# Patient Record
Sex: Female | Born: 1950 | Race: White | Hispanic: No | Marital: Married | Smoking: Never smoker
Health system: Southern US, Community
[De-identification: ages and names within clinical notes are randomized; demographics above are authoritative.]

## PROBLEM LIST (undated history)

## (undated) DIAGNOSIS — L259 Unspecified contact dermatitis, unspecified cause: Secondary | ICD-10-CM

## (undated) DIAGNOSIS — F329 Major depressive disorder, single episode, unspecified: Secondary | ICD-10-CM

## (undated) DIAGNOSIS — I10 Essential (primary) hypertension: Secondary | ICD-10-CM

## (undated) DIAGNOSIS — F411 Generalized anxiety disorder: Secondary | ICD-10-CM

## (undated) DIAGNOSIS — R32 Unspecified urinary incontinence: Secondary | ICD-10-CM

## (undated) DIAGNOSIS — R0609 Other forms of dyspnea: Secondary | ICD-10-CM

## (undated) DIAGNOSIS — E785 Hyperlipidemia, unspecified: Secondary | ICD-10-CM

## (undated) DIAGNOSIS — R062 Wheezing: Secondary | ICD-10-CM

## (undated) DIAGNOSIS — H7090 Unspecified mastoiditis, unspecified ear: Secondary | ICD-10-CM

## (undated) DIAGNOSIS — D649 Anemia, unspecified: Secondary | ICD-10-CM

## (undated) DIAGNOSIS — J984 Other disorders of lung: Secondary | ICD-10-CM

## (undated) DIAGNOSIS — E1159 Type 2 diabetes mellitus with other circulatory complications: Secondary | ICD-10-CM

## (undated) DIAGNOSIS — I5032 Chronic diastolic (congestive) heart failure: Secondary | ICD-10-CM

## (undated) DIAGNOSIS — L0390 Cellulitis, unspecified: Secondary | ICD-10-CM

## (undated) DIAGNOSIS — F3289 Other specified depressive episodes: Secondary | ICD-10-CM

## (undated) DIAGNOSIS — I509 Heart failure, unspecified: Secondary | ICD-10-CM

## (undated) DIAGNOSIS — R0989 Other specified symptoms and signs involving the circulatory and respiratory systems: Secondary | ICD-10-CM

## (undated) DIAGNOSIS — J069 Acute upper respiratory infection, unspecified: Secondary | ICD-10-CM

## (undated) DIAGNOSIS — H33039 Retinal detachment with giant retinal tear, unspecified eye: Secondary | ICD-10-CM

## (undated) DIAGNOSIS — J45901 Unspecified asthma with (acute) exacerbation: Secondary | ICD-10-CM

## (undated) HISTORY — DX: Unspecified contact dermatitis, unspecified cause: L25.9

## (undated) HISTORY — DX: Unspecified mastoiditis, unspecified ear: H70.90

## (undated) HISTORY — DX: Heart failure, unspecified: I50.9

## (undated) HISTORY — PX: ABDOMINAL HYSTERECTOMY: SHX81

## (undated) HISTORY — DX: Hyperlipidemia, unspecified: E78.5

## (undated) HISTORY — DX: Unspecified asthma with (acute) exacerbation: J45.901

## (undated) HISTORY — DX: Cellulitis, unspecified: L03.90

## (undated) HISTORY — DX: Type 2 diabetes mellitus with other circulatory complications: E11.59

## (undated) HISTORY — DX: Anemia, unspecified: D64.9

## (undated) HISTORY — DX: Retinal detachment with giant retinal tear, unspecified eye: H33.039

## (undated) HISTORY — DX: Essential (primary) hypertension: I10

## (undated) HISTORY — DX: Major depressive disorder, single episode, unspecified: F32.9

## (undated) HISTORY — DX: Other specified symptoms and signs involving the circulatory and respiratory systems: R09.89

## (undated) HISTORY — DX: Unspecified urinary incontinence: R32

## (undated) HISTORY — DX: Other specified depressive episodes: F32.89

## (undated) HISTORY — DX: Acute upper respiratory infection, unspecified: J06.9

## (undated) HISTORY — DX: Wheezing: R06.2

## (undated) HISTORY — PX: DILATION AND CURETTAGE OF UTERUS: SHX78

## (undated) HISTORY — PX: EYE SURGERY: SHX253

## (undated) HISTORY — DX: Chronic diastolic (congestive) heart failure: I50.32

## (undated) HISTORY — DX: Other specified symptoms and signs involving the circulatory and respiratory systems: R06.09

## (undated) HISTORY — DX: Generalized anxiety disorder: F41.1

## (undated) HISTORY — DX: Other disorders of lung: J98.4

---

## 1998-05-14 ENCOUNTER — Emergency Department (HOSPITAL_COMMUNITY): Admission: EM | Admit: 1998-05-14 | Discharge: 1998-05-14 | Payer: Self-pay | Admitting: Emergency Medicine

## 1998-11-15 ENCOUNTER — Encounter (INDEPENDENT_AMBULATORY_CARE_PROVIDER_SITE_OTHER): Payer: Self-pay | Admitting: Internal Medicine

## 1998-11-16 ENCOUNTER — Other Ambulatory Visit: Admission: RE | Admit: 1998-11-16 | Discharge: 1998-11-16 | Payer: Self-pay | Admitting: Family Medicine

## 1999-03-19 ENCOUNTER — Inpatient Hospital Stay (HOSPITAL_COMMUNITY): Admission: EM | Admit: 1999-03-19 | Discharge: 1999-03-27 | Payer: Self-pay | Admitting: Internal Medicine

## 1999-03-19 ENCOUNTER — Encounter: Payer: Self-pay | Admitting: Internal Medicine

## 1999-03-20 ENCOUNTER — Encounter: Payer: Self-pay | Admitting: Internal Medicine

## 1999-03-26 ENCOUNTER — Encounter: Payer: Self-pay | Admitting: Internal Medicine

## 1999-08-05 ENCOUNTER — Emergency Department (HOSPITAL_COMMUNITY): Admission: EM | Admit: 1999-08-05 | Discharge: 1999-08-05 | Payer: Self-pay | Admitting: Emergency Medicine

## 2000-01-15 ENCOUNTER — Encounter (INDEPENDENT_AMBULATORY_CARE_PROVIDER_SITE_OTHER): Payer: Self-pay | Admitting: Internal Medicine

## 2000-02-04 ENCOUNTER — Inpatient Hospital Stay (HOSPITAL_COMMUNITY): Admission: EM | Admit: 2000-02-04 | Discharge: 2000-02-08 | Payer: Self-pay | Admitting: Infectious Diseases

## 2000-04-16 ENCOUNTER — Encounter (INDEPENDENT_AMBULATORY_CARE_PROVIDER_SITE_OTHER): Payer: Self-pay | Admitting: Internal Medicine

## 2000-06-17 ENCOUNTER — Encounter: Payer: Self-pay | Admitting: General Surgery

## 2000-06-17 ENCOUNTER — Encounter: Admission: RE | Admit: 2000-06-17 | Discharge: 2000-06-17 | Payer: Self-pay | Admitting: General Surgery

## 2001-01-03 ENCOUNTER — Encounter: Payer: Self-pay | Admitting: Internal Medicine

## 2001-01-03 ENCOUNTER — Inpatient Hospital Stay (HOSPITAL_COMMUNITY): Admission: EM | Admit: 2001-01-03 | Discharge: 2001-01-04 | Payer: Self-pay | Admitting: Emergency Medicine

## 2001-04-16 ENCOUNTER — Encounter (INDEPENDENT_AMBULATORY_CARE_PROVIDER_SITE_OTHER): Payer: Self-pay | Admitting: Internal Medicine

## 2001-07-17 ENCOUNTER — Encounter (INDEPENDENT_AMBULATORY_CARE_PROVIDER_SITE_OTHER): Payer: Self-pay | Admitting: Internal Medicine

## 2001-07-17 LAB — CONVERTED CEMR LAB: Hgb A1c MFr Bld: 7.9 %

## 2001-09-16 HISTORY — PX: KNEE SURGERY: SHX244

## 2002-12-16 ENCOUNTER — Encounter (INDEPENDENT_AMBULATORY_CARE_PROVIDER_SITE_OTHER): Payer: Self-pay | Admitting: Internal Medicine

## 2002-12-16 LAB — CONVERTED CEMR LAB: Hgb A1c MFr Bld: 8.5 %

## 2002-12-30 ENCOUNTER — Other Ambulatory Visit: Admission: RE | Admit: 2002-12-30 | Discharge: 2003-01-15 | Payer: Self-pay | Admitting: Internal Medicine

## 2003-07-08 ENCOUNTER — Encounter: Admission: RE | Admit: 2003-07-08 | Discharge: 2003-07-08 | Payer: Self-pay | Admitting: Family Medicine

## 2003-07-08 ENCOUNTER — Encounter: Payer: Self-pay | Admitting: Family Medicine

## 2004-03-16 ENCOUNTER — Encounter (INDEPENDENT_AMBULATORY_CARE_PROVIDER_SITE_OTHER): Payer: Self-pay | Admitting: Internal Medicine

## 2004-03-16 LAB — CONVERTED CEMR LAB: Hgb A1c MFr Bld: 9.1 %

## 2004-05-17 ENCOUNTER — Encounter (INDEPENDENT_AMBULATORY_CARE_PROVIDER_SITE_OTHER): Payer: Self-pay | Admitting: Internal Medicine

## 2004-05-17 LAB — CONVERTED CEMR LAB: Hgb A1c MFr Bld: 8.2 %

## 2004-12-22 ENCOUNTER — Ambulatory Visit: Payer: Self-pay | Admitting: Family Medicine

## 2005-04-16 ENCOUNTER — Encounter (INDEPENDENT_AMBULATORY_CARE_PROVIDER_SITE_OTHER): Payer: Self-pay | Admitting: Internal Medicine

## 2005-04-16 LAB — CONVERTED CEMR LAB
Hgb A1c MFr Bld: 8 %
Pap Smear: NORMAL

## 2005-04-16 LAB — FECAL OCCULT BLOOD, GUAIAC: Fecal Occult Blood: NEGATIVE

## 2005-04-19 ENCOUNTER — Ambulatory Visit: Payer: Self-pay | Admitting: Family Medicine

## 2005-04-19 ENCOUNTER — Other Ambulatory Visit: Admission: RE | Admit: 2005-04-19 | Discharge: 2005-04-19 | Payer: Self-pay | Admitting: Family Medicine

## 2005-04-22 ENCOUNTER — Ambulatory Visit: Payer: Self-pay | Admitting: Family Medicine

## 2005-04-24 ENCOUNTER — Ambulatory Visit: Payer: Self-pay | Admitting: Family Medicine

## 2005-04-29 ENCOUNTER — Ambulatory Visit: Payer: Self-pay | Admitting: Family Medicine

## 2005-05-17 ENCOUNTER — Encounter (INDEPENDENT_AMBULATORY_CARE_PROVIDER_SITE_OTHER): Payer: Self-pay | Admitting: Internal Medicine

## 2005-05-17 LAB — CONVERTED CEMR LAB: Hgb A1c MFr Bld: 7.9 %

## 2005-05-31 ENCOUNTER — Ambulatory Visit: Payer: Self-pay | Admitting: Family Medicine

## 2005-06-04 ENCOUNTER — Ambulatory Visit: Payer: Self-pay | Admitting: Family Medicine

## 2005-06-07 ENCOUNTER — Ambulatory Visit: Payer: Self-pay | Admitting: Family Medicine

## 2005-06-12 ENCOUNTER — Ambulatory Visit: Payer: Self-pay

## 2005-06-13 ENCOUNTER — Ambulatory Visit: Payer: Self-pay

## 2005-07-31 ENCOUNTER — Encounter: Admission: RE | Admit: 2005-07-31 | Discharge: 2005-07-31 | Payer: Self-pay | Admitting: Family Medicine

## 2006-01-14 ENCOUNTER — Encounter (INDEPENDENT_AMBULATORY_CARE_PROVIDER_SITE_OTHER): Payer: Self-pay | Admitting: Internal Medicine

## 2006-01-28 ENCOUNTER — Ambulatory Visit: Payer: Self-pay | Admitting: Family Medicine

## 2006-05-17 ENCOUNTER — Encounter (INDEPENDENT_AMBULATORY_CARE_PROVIDER_SITE_OTHER): Payer: Self-pay | Admitting: Internal Medicine

## 2006-06-02 ENCOUNTER — Ambulatory Visit: Payer: Self-pay | Admitting: Family Medicine

## 2006-06-04 ENCOUNTER — Ambulatory Visit: Payer: Self-pay | Admitting: Family Medicine

## 2006-08-04 ENCOUNTER — Ambulatory Visit: Payer: Self-pay | Admitting: Family Medicine

## 2006-08-16 ENCOUNTER — Encounter (INDEPENDENT_AMBULATORY_CARE_PROVIDER_SITE_OTHER): Payer: Self-pay | Admitting: Internal Medicine

## 2006-08-16 LAB — CONVERTED CEMR LAB: Hgb A1c MFr Bld: 8.2 %

## 2006-09-11 ENCOUNTER — Ambulatory Visit: Payer: Self-pay | Admitting: Internal Medicine

## 2006-09-30 ENCOUNTER — Ambulatory Visit: Payer: Self-pay | Admitting: Family Medicine

## 2006-10-13 ENCOUNTER — Ambulatory Visit: Payer: Self-pay

## 2006-10-14 ENCOUNTER — Ambulatory Visit: Payer: Self-pay

## 2006-11-10 ENCOUNTER — Ambulatory Visit: Payer: Self-pay | Admitting: Family Medicine

## 2006-11-15 ENCOUNTER — Ambulatory Visit: Payer: Self-pay | Admitting: Internal Medicine

## 2006-11-15 ENCOUNTER — Encounter (INDEPENDENT_AMBULATORY_CARE_PROVIDER_SITE_OTHER): Payer: Self-pay | Admitting: Internal Medicine

## 2006-11-17 ENCOUNTER — Ambulatory Visit: Payer: Self-pay | Admitting: Cardiology

## 2006-11-17 ENCOUNTER — Ambulatory Visit: Payer: Self-pay | Admitting: Internal Medicine

## 2006-11-17 ENCOUNTER — Inpatient Hospital Stay (HOSPITAL_COMMUNITY): Admission: AD | Admit: 2006-11-17 | Discharge: 2006-11-20 | Payer: Self-pay | Admitting: Internal Medicine

## 2006-11-17 ENCOUNTER — Ambulatory Visit: Payer: Self-pay | Admitting: Family Medicine

## 2006-11-19 ENCOUNTER — Encounter: Payer: Self-pay | Admitting: Cardiology

## 2006-11-25 ENCOUNTER — Ambulatory Visit: Payer: Self-pay | Admitting: Family Medicine

## 2006-12-12 ENCOUNTER — Ambulatory Visit: Payer: Self-pay | Admitting: Family Medicine

## 2007-01-20 ENCOUNTER — Ambulatory Visit: Payer: Self-pay | Admitting: Family Medicine

## 2007-01-20 DIAGNOSIS — I509 Heart failure, unspecified: Secondary | ICD-10-CM

## 2007-01-20 DIAGNOSIS — J984 Other disorders of lung: Secondary | ICD-10-CM

## 2007-02-27 ENCOUNTER — Ambulatory Visit: Payer: Self-pay | Admitting: Family Medicine

## 2007-05-01 ENCOUNTER — Telehealth (INDEPENDENT_AMBULATORY_CARE_PROVIDER_SITE_OTHER): Payer: Self-pay | Admitting: *Deleted

## 2007-05-05 ENCOUNTER — Encounter: Payer: Self-pay | Admitting: Internal Medicine

## 2007-05-05 DIAGNOSIS — E785 Hyperlipidemia, unspecified: Secondary | ICD-10-CM

## 2007-05-05 DIAGNOSIS — F411 Generalized anxiety disorder: Secondary | ICD-10-CM

## 2007-05-05 DIAGNOSIS — E119 Type 2 diabetes mellitus without complications: Secondary | ICD-10-CM | POA: Insufficient documentation

## 2007-05-05 DIAGNOSIS — L259 Unspecified contact dermatitis, unspecified cause: Secondary | ICD-10-CM

## 2007-05-05 DIAGNOSIS — F329 Major depressive disorder, single episode, unspecified: Secondary | ICD-10-CM

## 2007-05-26 ENCOUNTER — Ambulatory Visit: Payer: Self-pay | Admitting: Family Medicine

## 2007-06-05 ENCOUNTER — Ambulatory Visit: Payer: Self-pay | Admitting: Cardiology

## 2007-06-09 LAB — CONVERTED CEMR LAB
CO2: 35 meq/L — ABNORMAL HIGH (ref 19–32)
Chloride: 108 meq/L (ref 96–112)
Creatinine, Ser: 0.6 mg/dL (ref 0.4–1.2)
Creatinine,U: 114.6 mg/dL
Glucose, Bld: 57 mg/dL — ABNORMAL LOW (ref 70–99)
Hgb A1c MFr Bld: 7.2 % — ABNORMAL HIGH (ref 4.6–6.0)
Sodium: 148 meq/L — ABNORMAL HIGH (ref 135–145)

## 2007-06-10 ENCOUNTER — Ambulatory Visit: Payer: Self-pay | Admitting: Family Medicine

## 2007-11-02 ENCOUNTER — Ambulatory Visit: Payer: Self-pay | Admitting: Family Medicine

## 2007-11-02 DIAGNOSIS — J069 Acute upper respiratory infection, unspecified: Secondary | ICD-10-CM | POA: Insufficient documentation

## 2007-11-02 DIAGNOSIS — R062 Wheezing: Secondary | ICD-10-CM

## 2007-11-03 ENCOUNTER — Ambulatory Visit: Payer: Self-pay | Admitting: Family Medicine

## 2007-11-03 DIAGNOSIS — I10 Essential (primary) hypertension: Secondary | ICD-10-CM | POA: Insufficient documentation

## 2007-11-04 LAB — CONVERTED CEMR LAB
AST: 21 units/L (ref 0–37)
CO2: 31 meq/L (ref 19–32)
Calcium: 9.1 mg/dL (ref 8.4–10.5)
Cholesterol: 141 mg/dL (ref 0–200)
GFR calc Af Amer: 111 mL/min
Glucose, Bld: 154 mg/dL — ABNORMAL HIGH (ref 70–99)
HDL: 38.6 mg/dL — ABNORMAL LOW (ref >39.0)
Hgb A1c MFr Bld: 7.3 % — ABNORMAL HIGH (ref 4.6–6.0)
Microalb Creat Ratio: 8.2 mg/g (ref 0.0–30.0)
TSH: 2.37 microintl units/mL (ref 0.35–5.50)
Total CHOL/HDL Ratio: 3.7

## 2007-11-09 ENCOUNTER — Ambulatory Visit: Payer: Self-pay | Admitting: Family Medicine

## 2007-11-24 ENCOUNTER — Telehealth (INDEPENDENT_AMBULATORY_CARE_PROVIDER_SITE_OTHER): Payer: Self-pay | Admitting: Internal Medicine

## 2007-11-26 ENCOUNTER — Telehealth (INDEPENDENT_AMBULATORY_CARE_PROVIDER_SITE_OTHER): Payer: Self-pay | Admitting: Internal Medicine

## 2007-11-26 ENCOUNTER — Inpatient Hospital Stay (HOSPITAL_COMMUNITY): Admission: EM | Admit: 2007-11-26 | Discharge: 2007-12-01 | Payer: Self-pay | Admitting: Emergency Medicine

## 2007-11-27 ENCOUNTER — Ambulatory Visit: Payer: Self-pay | Admitting: Internal Medicine

## 2007-12-08 ENCOUNTER — Ambulatory Visit: Payer: Self-pay | Admitting: Family Medicine

## 2007-12-08 DIAGNOSIS — J45901 Unspecified asthma with (acute) exacerbation: Secondary | ICD-10-CM | POA: Insufficient documentation

## 2007-12-08 DIAGNOSIS — D649 Anemia, unspecified: Secondary | ICD-10-CM

## 2008-01-14 ENCOUNTER — Emergency Department (HOSPITAL_COMMUNITY): Admission: EM | Admit: 2008-01-14 | Discharge: 2008-01-14 | Payer: Self-pay | Admitting: Emergency Medicine

## 2008-04-04 ENCOUNTER — Encounter (INDEPENDENT_AMBULATORY_CARE_PROVIDER_SITE_OTHER): Payer: Self-pay | Admitting: Internal Medicine

## 2008-05-02 IMAGING — CR DG CHEST 2V
2 series · 2 of 2 positions shown · non-contrast
Comparison: 11/26/2007 study

CLINICAL DATA: Chest pain.

CHEST - 2 VIEW

[w chest pa]
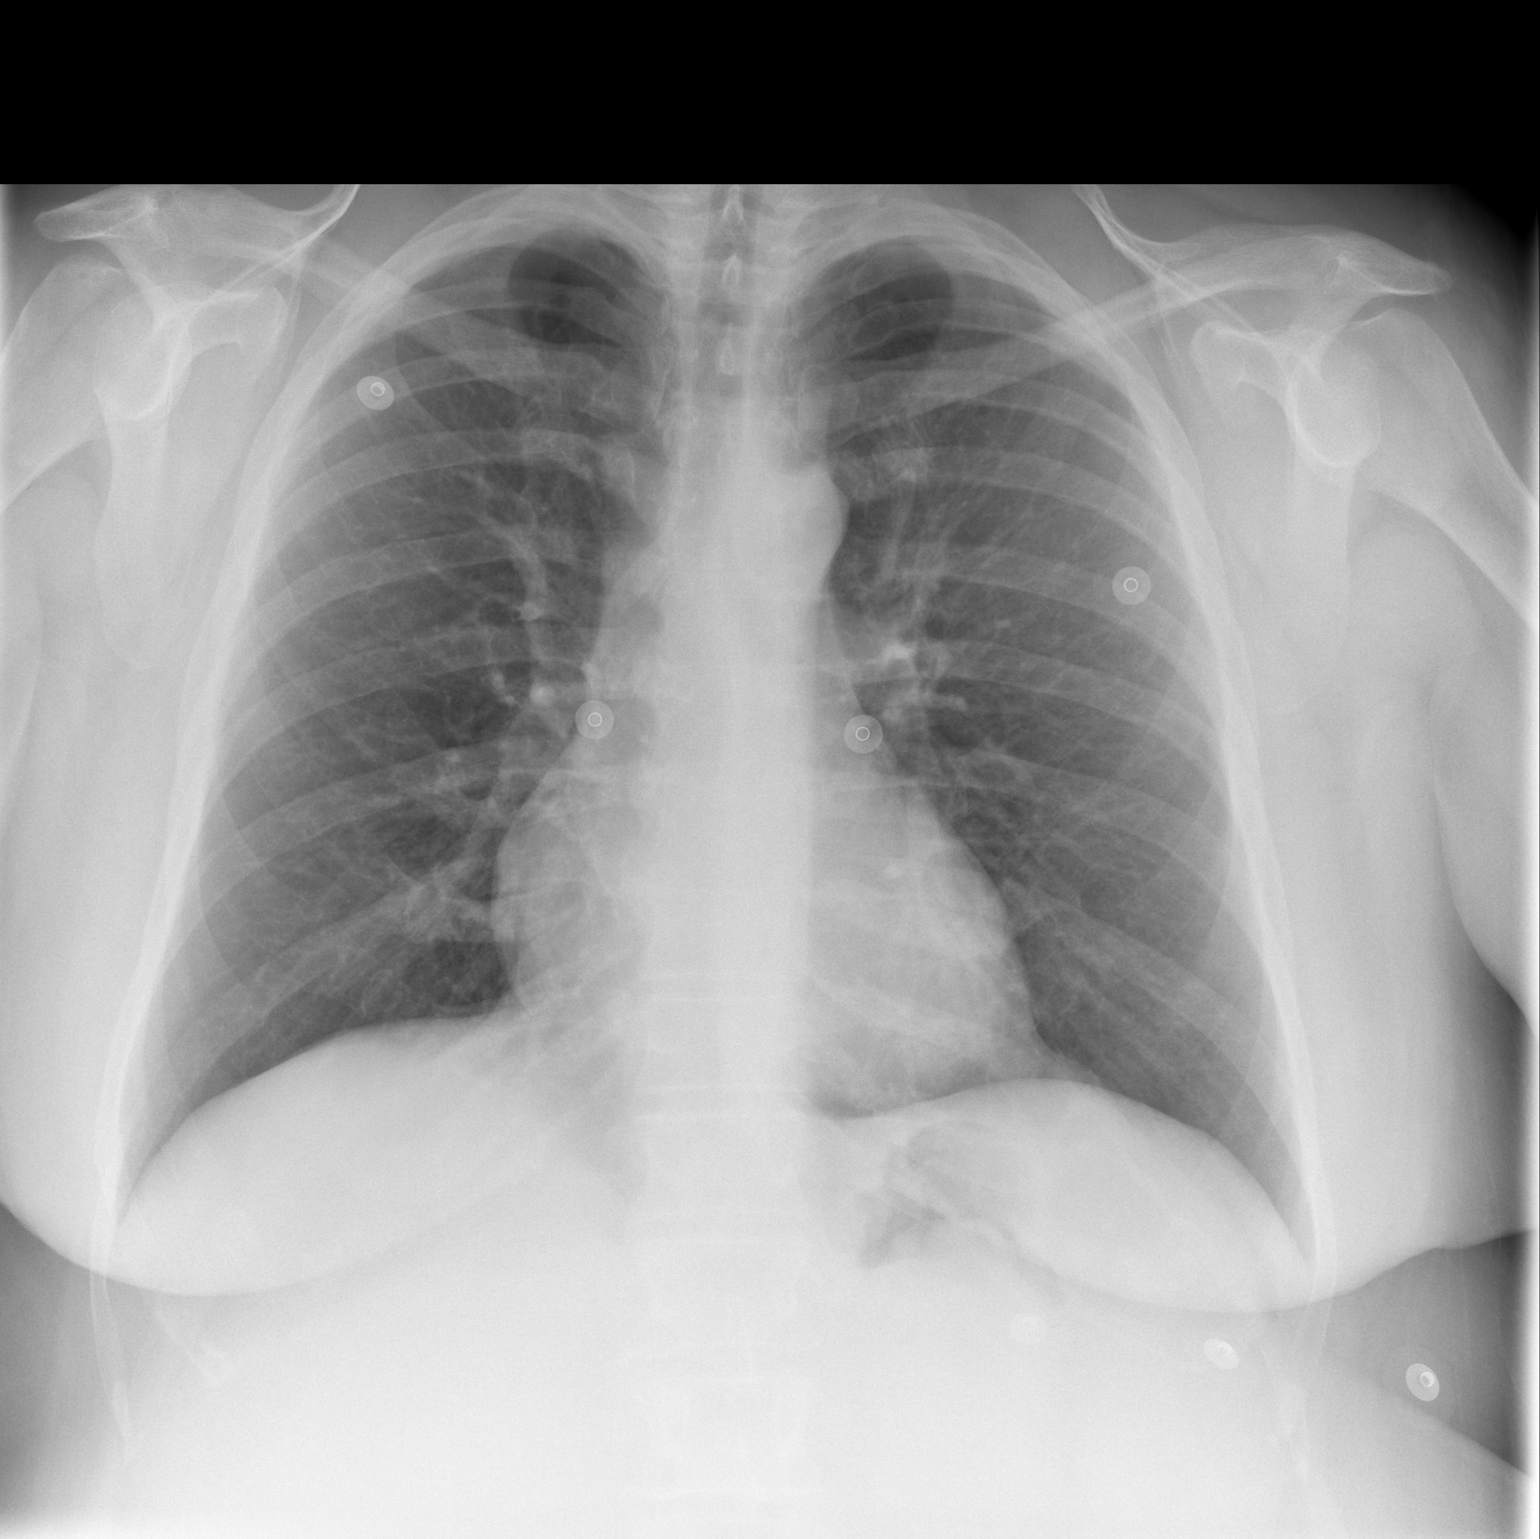

[w chest lat]
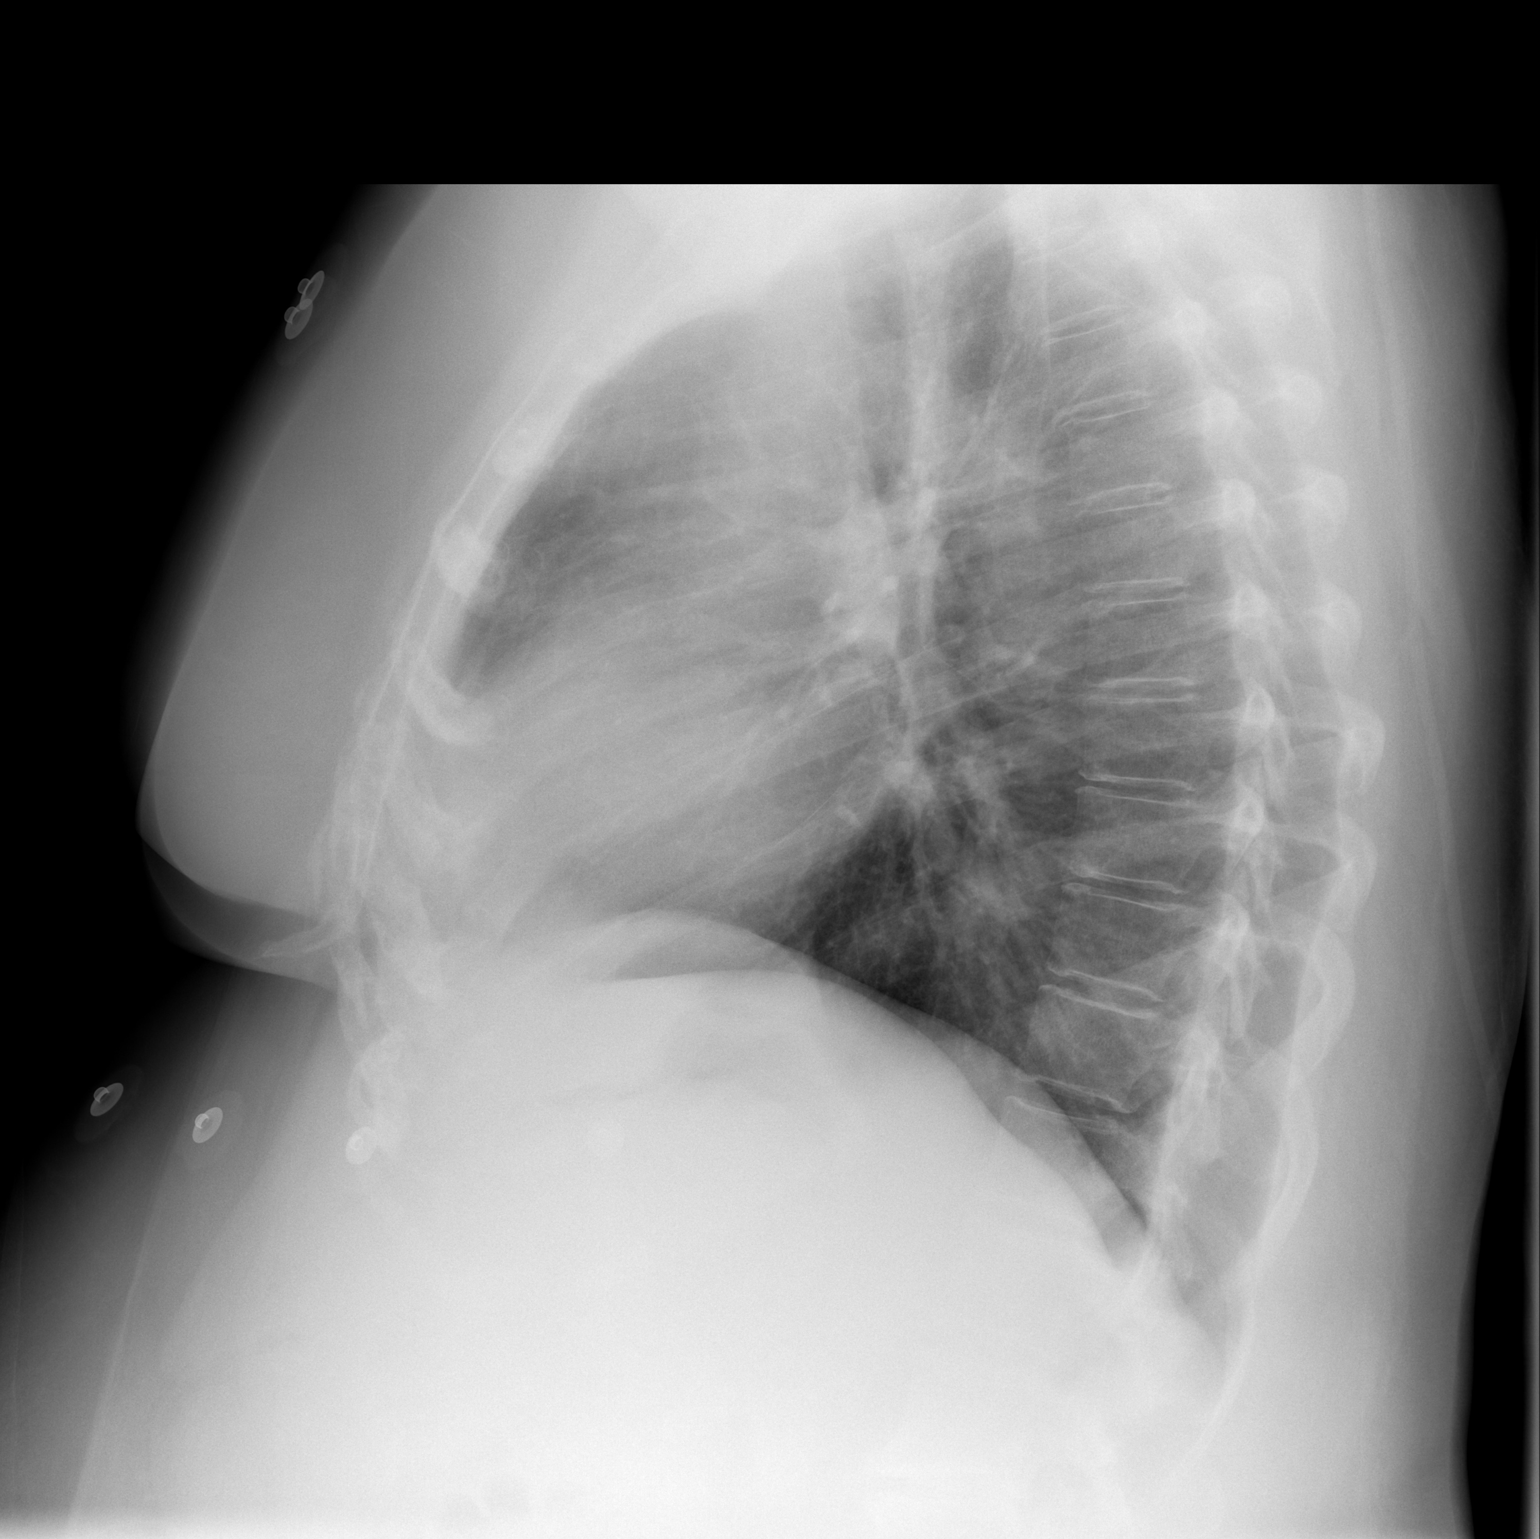

[2 of 2 positions shown; findings below may reference images not displayed]

FINDINGS: The cardiac silhouette is normal size and shape.  Lungs
free of infiltrates.  No mediastinal or pleural lesions are seen.
There is minimal degenerate spondylosis average for age.
IMPRESSION: No acute or active process seen.

## 2008-05-06 ENCOUNTER — Telehealth (INDEPENDENT_AMBULATORY_CARE_PROVIDER_SITE_OTHER): Payer: Self-pay | Admitting: Internal Medicine

## 2008-05-16 ENCOUNTER — Telehealth: Payer: Self-pay | Admitting: Family Medicine

## 2008-07-19 ENCOUNTER — Ambulatory Visit: Payer: Self-pay | Admitting: Cardiology

## 2008-07-19 ENCOUNTER — Telehealth (INDEPENDENT_AMBULATORY_CARE_PROVIDER_SITE_OTHER): Payer: Self-pay | Admitting: Internal Medicine

## 2008-07-22 ENCOUNTER — Telehealth (INDEPENDENT_AMBULATORY_CARE_PROVIDER_SITE_OTHER): Payer: Self-pay | Admitting: *Deleted

## 2008-07-24 DIAGNOSIS — H33039 Retinal detachment with giant retinal tear, unspecified eye: Secondary | ICD-10-CM

## 2008-07-24 LAB — HM DIABETES EYE EXAM

## 2008-07-26 ENCOUNTER — Ambulatory Visit (HOSPITAL_COMMUNITY): Admission: RE | Admit: 2008-07-26 | Discharge: 2008-07-27 | Payer: Self-pay | Admitting: Ophthalmology

## 2008-08-12 ENCOUNTER — Encounter (INDEPENDENT_AMBULATORY_CARE_PROVIDER_SITE_OTHER): Payer: Self-pay | Admitting: Internal Medicine

## 2008-12-09 ENCOUNTER — Telehealth (INDEPENDENT_AMBULATORY_CARE_PROVIDER_SITE_OTHER): Payer: Self-pay | Admitting: Internal Medicine

## 2008-12-13 ENCOUNTER — Ambulatory Visit: Payer: Self-pay | Admitting: Family Medicine

## 2008-12-20 LAB — CONVERTED CEMR LAB
ALT: 22 units/L (ref 0–35)
AST: 22 units/L (ref 0–37)
Basophils Absolute: 0 10*3/uL (ref 0.0–0.1)
Chloride: 108 meq/L (ref 96–112)
Creatinine,U: 124.2 mg/dL
Eosinophils Absolute: 0.1 10*3/uL (ref 0.0–0.7)
Eosinophils Relative: 2.5 % (ref 0.0–5.0)
HDL: 29.6 mg/dL — ABNORMAL LOW (ref >39.00)
Hgb A1c MFr Bld: 6.9 % — ABNORMAL HIGH (ref 4.6–6.5)
Lymphs Abs: 1.4 10*3/uL (ref 0.7–4.0)
Microalb Creat Ratio: 5.6 mg/g (ref 0.0–30.0)
Microalb, Ur: 0.7 mg/dL (ref 0.0–1.9)
Monocytes Absolute: 0.4 10*3/uL (ref 0.1–1.0)
Monocytes Relative: 6.9 % (ref 3.0–12.0)
Neutro Abs: 3.4 10*3/uL (ref 1.4–7.7)
Neutrophils Relative %: 64.7 % (ref 43.0–77.0)
Platelets: 232 10*3/uL (ref 150.0–400.0)
Potassium: 4.3 meq/L (ref 3.5–5.1)
WBC: 5.3 10*3/uL (ref 4.5–10.5)

## 2008-12-21 ENCOUNTER — Ambulatory Visit: Payer: Self-pay | Admitting: Family Medicine

## 2008-12-21 LAB — CONVERTED CEMR LAB: LDL Goal: 100 mg/dL

## 2009-01-24 ENCOUNTER — Telehealth: Payer: Self-pay | Admitting: Family Medicine

## 2009-02-27 ENCOUNTER — Ambulatory Visit: Payer: Self-pay | Admitting: Family Medicine

## 2009-02-28 ENCOUNTER — Telehealth (INDEPENDENT_AMBULATORY_CARE_PROVIDER_SITE_OTHER): Payer: Self-pay | Admitting: Internal Medicine

## 2009-02-28 DIAGNOSIS — R0609 Other forms of dyspnea: Secondary | ICD-10-CM

## 2009-02-28 DIAGNOSIS — I5032 Chronic diastolic (congestive) heart failure: Secondary | ICD-10-CM

## 2009-02-28 DIAGNOSIS — R0989 Other specified symptoms and signs involving the circulatory and respiratory systems: Secondary | ICD-10-CM

## 2009-03-01 ENCOUNTER — Ambulatory Visit: Payer: Self-pay | Admitting: Cardiology

## 2009-03-02 ENCOUNTER — Ambulatory Visit: Payer: Self-pay | Admitting: Family Medicine

## 2009-04-10 ENCOUNTER — Encounter (INDEPENDENT_AMBULATORY_CARE_PROVIDER_SITE_OTHER): Payer: Self-pay | Admitting: Internal Medicine

## 2009-05-02 ENCOUNTER — Ambulatory Visit: Payer: Self-pay | Admitting: Family Medicine

## 2009-05-04 LAB — CONVERTED CEMR LAB: Hgb A1c MFr Bld: 8.2 % — ABNORMAL HIGH (ref 4.6–6.5)

## 2009-05-10 ENCOUNTER — Ambulatory Visit: Payer: Self-pay | Admitting: Family Medicine

## 2009-05-10 DIAGNOSIS — H3320 Serous retinal detachment, unspecified eye: Secondary | ICD-10-CM | POA: Insufficient documentation

## 2009-08-09 ENCOUNTER — Encounter (INDEPENDENT_AMBULATORY_CARE_PROVIDER_SITE_OTHER): Payer: Self-pay | Admitting: Internal Medicine

## 2009-08-09 ENCOUNTER — Ambulatory Visit: Payer: Self-pay | Admitting: Family Medicine

## 2009-08-09 DIAGNOSIS — Z780 Asymptomatic menopausal state: Secondary | ICD-10-CM | POA: Insufficient documentation

## 2009-08-09 LAB — CONVERTED CEMR LAB
ALT: 21 units/L (ref 0–35)
CO2: 30 meq/L (ref 19–32)
Chloride: 101 meq/L (ref 96–112)
Cholesterol: 170 mg/dL (ref 0–200)
Creatinine, Ser: 0.6 mg/dL (ref 0.4–1.2)
Glucose, Bld: 133 mg/dL — ABNORMAL HIGH (ref 70–99)
Hgb A1c MFr Bld: 7.4 % — ABNORMAL HIGH (ref 4.6–6.5)
Triglycerides: 135 mg/dL (ref 0.0–149.0)
VLDL: 27 mg/dL (ref 0.0–40.0)

## 2009-08-16 LAB — CONVERTED CEMR LAB: Vit D, 25-Hydroxy: 20 ng/mL — ABNORMAL LOW (ref 30–89)

## 2009-08-17 ENCOUNTER — Encounter (INDEPENDENT_AMBULATORY_CARE_PROVIDER_SITE_OTHER): Payer: Self-pay | Admitting: Internal Medicine

## 2009-10-23 ENCOUNTER — Telehealth: Payer: Self-pay | Admitting: Family Medicine

## 2009-12-28 ENCOUNTER — Telehealth: Payer: Self-pay | Admitting: Family Medicine

## 2009-12-29 ENCOUNTER — Ambulatory Visit: Payer: Self-pay | Admitting: Family Medicine

## 2009-12-29 DIAGNOSIS — E669 Obesity, unspecified: Secondary | ICD-10-CM | POA: Insufficient documentation

## 2010-01-04 ENCOUNTER — Encounter: Payer: Self-pay | Admitting: Family Medicine

## 2010-01-04 LAB — CONVERTED CEMR LAB
Albumin: 3.6 g/dL (ref 3.5–5.2)
CO2: 29 meq/L (ref 19–32)
Calcium: 8.7 mg/dL (ref 8.4–10.5)
Creatinine, Ser: 0.7 mg/dL (ref 0.4–1.2)
Glucose, Bld: 196 mg/dL — ABNORMAL HIGH (ref 70–99)
HDL: 44 mg/dL (ref >39.00)
Hgb A1c MFr Bld: 7.4 % — ABNORMAL HIGH (ref 4.6–6.5)
Total Protein: 6.6 g/dL (ref 6.0–8.3)
Triglycerides: 156 mg/dL — ABNORMAL HIGH (ref 0.0–149.0)
VLDL: 31.2 mg/dL (ref 0.0–40.0)

## 2010-01-09 ENCOUNTER — Encounter: Admission: RE | Admit: 2010-01-09 | Discharge: 2010-01-09 | Payer: Self-pay | Admitting: Family Medicine

## 2010-01-17 ENCOUNTER — Encounter (INDEPENDENT_AMBULATORY_CARE_PROVIDER_SITE_OTHER): Payer: Self-pay | Admitting: *Deleted

## 2010-01-17 ENCOUNTER — Ambulatory Visit: Payer: Self-pay | Admitting: Family Medicine

## 2010-01-17 ENCOUNTER — Other Ambulatory Visit: Admission: RE | Admit: 2010-01-17 | Discharge: 2010-01-17 | Payer: Self-pay | Admitting: Family Medicine

## 2010-01-24 ENCOUNTER — Encounter (INDEPENDENT_AMBULATORY_CARE_PROVIDER_SITE_OTHER): Payer: Self-pay | Admitting: *Deleted

## 2010-01-24 LAB — CONVERTED CEMR LAB: Pap Smear: NEGATIVE

## 2010-02-05 ENCOUNTER — Encounter (INDEPENDENT_AMBULATORY_CARE_PROVIDER_SITE_OTHER): Payer: Self-pay | Admitting: *Deleted

## 2010-02-07 ENCOUNTER — Telehealth: Payer: Self-pay | Admitting: Family Medicine

## 2010-02-08 ENCOUNTER — Ambulatory Visit: Payer: Self-pay | Admitting: Internal Medicine

## 2010-02-08 ENCOUNTER — Encounter (INDEPENDENT_AMBULATORY_CARE_PROVIDER_SITE_OTHER): Payer: Self-pay | Admitting: *Deleted

## 2010-02-22 ENCOUNTER — Ambulatory Visit: Payer: Self-pay | Admitting: Internal Medicine

## 2010-02-22 LAB — HM COLONOSCOPY

## 2010-02-23 ENCOUNTER — Encounter: Payer: Self-pay | Admitting: Internal Medicine

## 2010-04-16 ENCOUNTER — Ambulatory Visit: Payer: Self-pay | Admitting: Family Medicine

## 2010-04-16 LAB — CONVERTED CEMR LAB: Hgb A1c MFr Bld: 7.5 % — ABNORMAL HIGH (ref 4.6–6.5)

## 2010-04-25 ENCOUNTER — Telehealth: Payer: Self-pay | Admitting: Family Medicine

## 2010-04-25 ENCOUNTER — Ambulatory Visit: Payer: Self-pay | Admitting: Family Medicine

## 2010-04-30 ENCOUNTER — Encounter: Payer: Self-pay | Admitting: Family Medicine

## 2010-05-17 ENCOUNTER — Encounter: Payer: Self-pay | Admitting: Family Medicine

## 2010-05-23 ENCOUNTER — Ambulatory Visit (HOSPITAL_COMMUNITY): Admission: RE | Admit: 2010-05-23 | Discharge: 2010-05-23 | Payer: Self-pay | Admitting: General Surgery

## 2010-06-08 ENCOUNTER — Encounter: Admission: RE | Admit: 2010-06-08 | Discharge: 2010-06-15 | Payer: Self-pay | Admitting: General Surgery

## 2010-07-19 ENCOUNTER — Ambulatory Visit: Payer: Self-pay | Admitting: Family Medicine

## 2010-08-02 ENCOUNTER — Ambulatory Visit: Payer: Self-pay | Admitting: Family Medicine

## 2010-08-06 ENCOUNTER — Encounter
Admission: RE | Admit: 2010-08-06 | Discharge: 2010-10-16 | Payer: Self-pay | Source: Home / Self Care | Attending: General Surgery | Admitting: General Surgery

## 2010-08-21 ENCOUNTER — Ambulatory Visit (HOSPITAL_COMMUNITY)
Admission: RE | Admit: 2010-08-21 | Discharge: 2010-08-22 | Payer: Self-pay | Source: Home / Self Care | Attending: General Surgery | Admitting: General Surgery

## 2010-08-21 HISTORY — PX: LAPAROSCOPIC GASTRIC BANDING: SHX1100

## 2010-08-31 ENCOUNTER — Encounter: Payer: Self-pay | Admitting: Family Medicine

## 2010-09-04 ENCOUNTER — Encounter
Admission: RE | Admit: 2010-09-04 | Discharge: 2010-10-16 | Payer: Self-pay | Source: Home / Self Care | Attending: General Surgery | Admitting: General Surgery

## 2010-09-28 ENCOUNTER — Ambulatory Visit
Admission: RE | Admit: 2010-09-28 | Discharge: 2010-09-28 | Payer: Self-pay | Source: Home / Self Care | Attending: Family Medicine | Admitting: Family Medicine

## 2010-10-07 ENCOUNTER — Encounter: Payer: Self-pay | Admitting: General Surgery

## 2010-10-16 NOTE — Letter (Signed)
Summary: Generic Letter  [place] at [facility]  [address] [place], [place] [address]   Phone: [phone]  Fax: [phone]    01/04/2010  LORALEI RADCLIFFE [address] [place], [place]  [address]  Dear [name],   We have been unable to contact you by telephone.  We need to speak with you about your lab results and we need an update contact number for you.  Please call our office at your earliest convenience.         Sincerely,   Linde Gillis CMA (AAMA)

## 2010-10-16 NOTE — Consult Note (Signed)
Summary: [facility]   Imported By: Sherian Rein 06/02/2010 09:52:14  _____________________________________________________________________  External Attachment:    Type:   Image     Comment:   External Document

## 2010-10-16 NOTE — Letter (Signed)
Summary: Previsit letter  [facility]  [address] [place], [place] [address]   Phone: [phone]  Fax: [phone]       01/17/2010 MRN: [id number]  [facility] [address] [place], [place]  [address]  Dear [name],  Welcome to the Gastroenterology Division at [facility].    You are scheduled to see a nurse for your pre-procedure visit on 02-08-10 at 11:00a.m. on the 3rd floor at [facility] [address]  We ask that you try to arrive at our office 15 minutes prior to your appointment time to allow for check-in.  Your nurse visit will consist of discussing your medical and surgical history, your immediate family medical history, and your medications.    Please bring a complete list of all your medications or, if you prefer, bring the medication bottles and we will list them.  We will need to be aware of both prescribed and over the counter drugs.  We will need to know exact dosage information as well.  If you are on blood thinners (Coumadin, Plavix, Aggrenox, Ticlid, etc.) please call our office today/prior to your appointment, as we need to consult with your physician about holding your medication.   Please be prepared to read and sign documents such as consent forms, a financial agreement, and acknowledgement forms.  If necessary, and with your consent, a friend or relative is welcome to sit-in on the nurse visit with you.  Please bring your insurance card so that we may make a copy of it.  If your insurance requires a referral to see a specialist, please bring your referral form from your primary care physician.  No co-pay is required for this nurse visit.     If you cannot keep your appointment, please call [phone] to cancel or reschedule prior to your appointment date.  This allows [place] the opportunity to schedule an appointment for another patient in need of care.    Thank you for choosing [facility] for your medical needs.   We appreciate the opportunity to care for you.  Please visit [place] at our website  to learn more about our practice.                     Sincerely.                                                                                                                   The Gastroenterology Division

## 2010-10-16 NOTE — Letter (Signed)
Summary: *Referral Letter  [place] at [facility]  [address] [place], [place] [address]   Phone: [phone]  Fax: [phone]    04/25/2010  Thank you in advance for agreeing to see my patient:  [name] [address] [place], [place]  [address]  Phone: [phone]  Reason for Referral:  Bariatric consult  Procedures Requested:  Bariatric surgery consult  Current Medical Problems: 1)  HEALTH SCREENING (ICD-V70.0) 2)  SPECIAL SCREENING FOR MALIGNANT NEOPLASMS COLON (ICD-V76.51) 3)  SCREENING FOR MALIGNANT NEOPLASM OF THE CERVIX (ICD-V76.2) 4)  OTHER SCREENING MAMMOGRAM (ICD-V76.12) 5)  OBESITY, UNSPECIFIED (ICD-278.00) 6)  POSTMENOPAUSAL STATUS (ICD-V49.81) 7)  RETINAL DETACHMENT (ICD-361.9) 8)  DIASTOLIC HEART FAILURE, CHRONIC (ICD-428.32) 9)  CHF (ICD-428.0) 10)  DYSPNEA ON EXERTION (ICD-786.09) 11)  HYPERTENSION, BENIGN (ICD-401.1) 12)  HYPERLIPIDEMIA (ICD-272.4) 13)  DIABETES MELLITUS, TYPE II, CONTROLLED, 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) 14)  ANXIETY (ICD-300.00) 15)  DEPRESSION (ICD-311) 16)  ASTHMA, WITH ACUTE EXACERBATION (ICD-493.92) 17)  RECENT RETINAL DETACHMENT PARTIAL W/GIANT TEAR (ICD-361.03) 18)  ANEMIA (ICD-285.9) 19)  WHEEZING (ICD-786.07) 20)  URI (ICD-465.9) 21)  DERMATITIS, CHRONIC (ICD-692.9) 22)  ECZEMA (ICD-692.9) 23)  LUNG NODULE (ICD-518.89) 24)  URINARY INCONTINENCE.Marland KitchenCHRONIC (ICD-788.30)   Current Medications: 1)  ALBUTEROL 90 MCG/ACT AERS (ALBUTEROL) Inhale 2 puff using inhaler once a day as needed 2)  LASIX 20 MG TABS (FUROSEMIDE) Take 1 tablet by mouth every morning 3)  MEVACOR 20 MG TABS (LOVASTATIN) take one tablet by mouth daily 4)  ACTOS 45 MG  TABS (PIOGLITAZONE HCL) Take 1 tablet by mouth once a day 5)  ADULT ASPIRIN EC LOW STRENGTH 81 MG  TBEC (ASPIRIN) Take 1 tablet by mouth every other day 6)  LISINOPRIL 10 MG  TABS (LISINOPRIL) Take 1 tablet by mouth at bedtime 7)  BUSPAR 10 MG  TABS (BUSPIRONE HCL) take 2  tabs at bedtime 8)  GLUCOVANCE 5-500 MG  TABS (GLYBURIDE-METFORMIN) 1 tab  twice a day 9)  ZANTAC 150 MG TABS (RANITIDINE HCL) take one by mouth twice a day 10)  LUTEIN 20 MG CAPS (LUTEIN) OTC as directed 11)  ALEVE 220 MG TABS (NAPROXEN SODIUM) OTC one tablet twice a day 12)  PROZAC 20 MG CAPS (FLUOXETINE HCL) take 3 each day 13)  LANTUS SOLOSTAR 100 UNIT/ML SOLN (INSULIN GLARGINE) increase dose to 25 units 14)  ONETOUCH ULTRA TEST  STRP (GLUCOSE BLOOD) Test sugars four times daily. 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  Current height is 65.5 inchws, weight 273, BMI 44.94.  The patient has tried Weight watchers, slim fast, and colon cleanse.  I feel this patient would benefit from weight loss surgery becuase she has been unsuccessful losing weight with other strategies.   Thank you again for agreeing to  see our patient; please contact us if you have any further questions or need additional information.  Sincerely,  Ruthe Mannan MD

## 2010-10-16 NOTE — Assessment & Plan Note (Signed)
Summary: 30 MIN F/U/BILLIE'S PT/CLE   Vital Signs:  Patient profile:   60 year old female Height:      65.5 inches Weight:      267.50 pounds BMI:     44.00 Temp:     98.3 degrees F oral Pulse rate:   72 / minute Pulse rhythm:   regular BP sitting:   110 / 62  (left arm) Cuff size:   large  Vitals Entered By: Linde Gillis CMA Duncan Dull) (December 29, 2009 9:50 AM) CC: 30 minute follow up   History of Present Illness: 60 yo female new to me here to establish care and to discuss many issues.    DM- does not check sugars regularly.  a1c last checked in 11/10- 7.4.  Denies any symptoms of hypoglycemia.  Currently taking Lantus 18 units daily, Actos 45 mg daily, Glucovance 5-500 mg two times a day.   Depression/anxiety- chronic issue.  Has been on Prozac 60 mg and Buspar 20 mg qhs for months.  She feels like symptoms of depression are worse because she is now not working due to her eye surgeries and gaining weight. Feels down, more tearful.  Less motivated to get out and do things.  She feels awful about her weight, becomes anxious when she has to go out in public.  Feels like people are judging her due to her obesity.   Has panic attacks in [employer], does not occur anywhere.  She is not sure why. No SI or HI.  Obesity- see above.  Has tried every diet imaginable, including weight watchers, slim fast, colon cleanse.  Can never keep weight off.  Well woman-  needs colonoscopy, mammogram, pap, FLP, a1c.   PMH reviewed.  Current Medications (verified): 1)  Albuterol 90 Mcg/act Aers (Albuterol) .... Inhale 2 Puff Using Inhaler Once A Day As Needed 2)  Lasix 20 Mg Tabs (Furosemide) .... Take 1 Tablet By Mouth Every Morning 3)  Mevacor 20 Mg Tabs (Lovastatin) .... Take 1 Tablet By Mouth Once A Day 4)  Actos 45 Mg  Tabs (Pioglitazone Hcl) .... Take 1 Tablet By Mouth Once A Day 5)  Adult Aspirin Ec Low Strength 81 Mg  Tbec (Aspirin) .... Take 1 Tablet By Mouth Every Other Day 6)  Lisinopril 10 Mg   Tabs (Lisinopril) .... Take 1 Tablet By Mouth At Bedtime 7)  Buspar 10 Mg  Tabs (Buspirone Hcl) .... Take 2 Tabs At Bedtime 8)  Glucovance 5-500 Mg  Tabs (Glyburide-Metformin) .Marland Kitchen.. 1 Tab  Twice A Day 9)  Zantac 150 Mg Tabs (Ranitidine Hcl) .... Take One By Mouth Twice A Day 10)  Lutein 20 Mg Caps (Lutein) .... Otc As Directed 11)  Aleve 220 Mg Tabs (Naproxen Sodium) .... Otc One Tablet Twice A Day 12)  Prozac 20 Mg Caps (Fluoxetine Hcl) .... Take 3 Each Day 13)  Lantus Solostar 100 Unit/ml Soln (Insulin Glargine) .... Increase Dose To 18 Units  Allergies: 1)  ! Codeine 2)  ! 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 Eyes:  Denies blurring. ENT:  Denies difficulty swallowing. CV:  Denies chest pain or discomfort. Resp:  Denies shortness of breath. GI:  Denies abdominal pain, bloody stools, and change in bowel habits. MS:  Denies muscle weakness. Neuro:  Denies headaches. Psych:  Complains of anxiety, depression, and panic attacks; denies sense of great danger, suicidal thoughts/plans, and thoughts of violence. Endo:  Denies excessive thirst and excessive urination.  Physical Exam  General:  alert, well-developed, well-nourished, well-hydrated, and overweight-appearing.  NAD. Eyes:  pupils equal and pupils round.   Ears:   R ear normal and L ear normal.   Nose:  External nasal examination shows no deformity or inflammation. Nasal mucosa are pink and moist without lesions or exudates. Mouth:  pharynx pink and moist and no exudates.   Lungs:  normal respiratory effort, no intercostal retractions, no accessory muscle use, and normal breath sounds.   Heart:  normal rate, regular rhythm, and no murmur.   Extremities:  no edema Psych:  normally interactive, depressed affect, flat affect, and subdued.     Impression & Recommendations:  Problem # 1:  DEPRESSION (ICD-311) Assessment Deteriorated with component of anxiety.  Appears to be largely deteriorated due to her weight gain. Time spent with patient 45 minutes, more than 50% of this time was discussing her obesity, anxiety and depression. We agreed to keep current depression/anxiety meds at same doses and discussed weight loss options.  See below.   Her updated medication list for this problem includes:    Buspar 10 Mg Tabs (Buspirone hcl) .Marland Kitchen... Take 2 tabs at bedtime    Prozac 20 Mg Caps (Fluoxetine hcl) .Marland Kitchen... Take 3 each day  Problem # 2:  OBESITY, UNSPECIFIED (ICD-278.00) Assessment: Deteriorated Discussed options, we agreed to referral for bariatric surgery.  Would improve her back pain, knee pain, HTN, and DM. 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   Her updated medication list for this problem includes:    Actos 45 Mg Tabs (Pioglitazone hcl) .Marland Kitchen... Take 1 tablet by mouth once a day    Adult Aspirin Ec Low Strength 81 Mg Tbec (Aspirin) .Marland Kitchen... Take 1 tablet by mouth every other day    Lisinopril 10 Mg Tabs (Lisinopril) .Marland Kitchen... Take 1 tablet by mouth at bedtime    Glucovance 5-500 Mg Tabs (Glyburide-metformin) .Marland Kitchen... 1 tab  twice a day    Lantus Solostar 100 Unit/ml Soln (Insulin glargine) ..... Increase dose to 18  units  Orders: Venipuncture (09811) TLB-A1C / Hgb A1C (Glycohemoglobin) (83036-A1C)  Problem # 4:  HYPERTENSION, BENIGN (ICD-401.1) Assessment: Unchanged Stable.  Continue current meds. Her updated medication list for this problem includes:    Lasix 20 Mg Tabs (Furosemide) .Marland Kitchen... Take 1 tablet by mouth every morning    Lisinopril 10 Mg Tabs (Lisinopril) .Marland Kitchen... Take 1 tablet by mouth at bedtime  Orders: Venipuncture ([id number]) TLB-BMP (Basic Metabolic Panel-BMET) (80048-METABOL)  Problem # 5:  Preventive Health Care (ICD-V70.0) Assessment: Comment Only set up mammogram today. FLP, BMET today. Pt to make appt for CPE/Pap.  Problem # 6:  HYPERLIPIDEMIA (ICD-272.4) Assessment: Unchanged FLP today, continue mevacor. Orders: Venipuncture ([id number]) TLB-Lipid Panel (80061-LIPID) TLB-Hepatic/Liver Function Pnl (80076-HEPATIC)  Complete Medication List: 1)  Albuterol 90 Mcg/act Aers (Albuterol) .... Inhale 2 puff using inhaler once a day as needed 2)  Lasix 20 Mg Tabs (Furosemide) .... Take 1 tablet by mouth every morning 3)  Mevacor 20 Mg Tabs (lovastatin)  .... Take 1 tablet by mouth once a day 4)  Actos 45 Mg Tabs (Pioglitazone hcl) .... Take 1 tablet by mouth once a day 5)  Adult Aspirin Ec Low Strength 81 Mg Tbec (Aspirin) .... Take 1 tablet by mouth every other day 6)  Lisinopril 10 Mg Tabs (Lisinopril) .... Take 1 tablet by mouth at bedtime 7)  Buspar 10 Mg Tabs (Buspirone hcl) .... Take 2 tabs at bedtime 8)  Glucovance 5-500 Mg Tabs (Glyburide-metformin) .Marland Kitchen.. 1 tab  twice a day 9)  Zantac 150 Mg Tabs (Ranitidine hcl) .... Take one by mouth twice a day 10)  Lutein 20 Mg Caps (Lutein) .... Otc as directed 11)  Aleve 220 Mg Tabs (Naproxen sodium) .... Otc one tablet twice a day 12)  Prozac 20 Mg Caps (Fluoxetine hcl) .... Take 3 each day 13)  Lantus Solostar 100 Unit/ml Soln (Insulin glargine) .... Increase dose to 18 units  Other Orders: Radiology Referral (Radiology)  Patient  Instructions: 1)  Great to meet you Ms. Shasteen. 2)  Please make an appointment on your way out for complete physical pap. 3)  Stop by to see Mairon on your way to set up your surgery referral and mammogram.  Current Allergies (reviewed today): ! CODEINE ! * REGULAR ASPIRIN

## 2010-10-16 NOTE — Progress Notes (Signed)
Summary: Letter for Bariatric Surgery  Phone Note Call from Patient Call back at [phone] cell   Caller: Patient Call For: Ruthe Mannan MD Summary of Call: Patient dropped off a letter from [facility] regarding Bariatric surgery.  Letter in your IN box.   Initial call taken by: Linde Gillis CMA Duncan Dull),  April 25, 2010 10:48 AM  Follow-up for Phone Call        Form completed. Ruthe Mannan MD  April 25, 2010 11:44 AM  Patient notified as instructed, form completed and ready for pick up and there is a $50 fee.  Forms left at front desk for patient pick up.  Will send them to be scanned as well. Follow-up by: Linde Gillis CMA Duncan Dull),  April 25, 2010 11:54 AM

## 2010-10-16 NOTE — Assessment & Plan Note (Signed)
Summary: DISCUSS A1C/NT   Vital Signs:  Patient profile:   60 year old female Height:      65.5 inches Weight:      270 pounds BMI:     44.41 Temp:     98.5 degrees F oral Pulse rate:   76 / minute Pulse rhythm:   regular BP sitting:   130 / 70  (left arm) Cuff size:   large  Vitals Entered By: Linde Gillis CMA Duncan Dull) (August 02, 2010 11:50 AM) CC: discuss A1C   History of Present Illness: 60 yo here for follow up DM.  DM- does not check sugars regularly.  a1c last checked this month increased to 8.1 ( was 7.5).   Denies any symptoms of hypoglycemia.  Currently taking Lantus 28 units daily Actos 45 mg daily, Glucovance 5-500 mg two times a day.  Scheduled for lap band surgery in a couple of weeks and admits to eating everything sweet she can find because she is not sure if she will ever be able to eat it again!    Current Medications (verified): 1)  Albuterol 90 Mcg/act Aers (Albuterol) .... Inhale 2 Puff Using Inhaler Once A Day As Needed 2)  Lasix 20 Mg Tabs (Furosemide) .... Take 1 Tablet By Mouth Every Morning 3)  Mevacor 20 Mg Tabs (Lovastatin) .... Take One Tablet By Mouth Daily 4)  Actos 45 Mg  Tabs (Pioglitazone Hcl) .... Take 1 Tablet By Mouth Once A Day 5)  Adult Aspirin Ec Low Strength 81 Mg  Tbec (Aspirin) .... Take 1 Tablet By Mouth Every Other Day 6)  Lisinopril 10 Mg  Tabs (Lisinopril) .... Take 1 Tablet By Mouth At Bedtime 7)  Buspar 10 Mg  Tabs (Buspirone Hcl) .... Take 2 Tabs At Bedtime 8)  Glucovance 5-500 Mg  Tabs (Glyburide-Metformin) .Marland Kitchen.. 1 Tab  Twice A Day 9)  Zantac 150 Mg Tabs (Ranitidine Hcl) .... Take One By Mouth Twice A Day 10)  Lutein 20 Mg Caps (Lutein) .... Otc As Directed 11)  Aleve 220 Mg Tabs (Naproxen Sodium) .... Otc One Tablet Twice A Day 12)  Prozac 20 Mg Caps (Fluoxetine Hcl) .... Take 3 Each Day 13)  Lantus Solostar 100 Unit/ml Soln (Insulin Glargine) .... Increase Dose To 25 Units 14)  Onetouch Ultra Test  Strp (Glucose Blood) ....  Test Sugars Four Times Daily. 15)  Lantus 100 Unit/ml Soln (Insulin Glargine) .... Use As Directed  Allergies: 1)  ! Codeine 2)  ! * Regular Aspirin  Review of Systems Endo:  Denies excessive thirst and excessive urination.  Physical Exam  General:  alert, well-developed, well-nourished, well-hydrated, and overweight-appearing.  NAD. Psych:  normally interactive, depressed affect, flat affect, and subdued.     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 She is very excited about her lap band surgery and has really done her research.  I am very impressed with her decision to make this a permanent lifestyle change. Will continue current dose of lantus as she starts the liquid diet in 3 days. Pt will check her CBGs twice daily and call me with a log in 1 week, sooner if her CBG drops below 80 and/or she is symptomatic.  Her updated medication list for this problem includes:    Actos 45 Mg Tabs (Pioglitazone hcl) .Marland Kitchen... Take 1 tablet  by mouth once a day    Adult Aspirin Ec Low Strength 81 Mg Tbec (Aspirin) .Marland Kitchen... Take 1 tablet by mouth every other day    Lisinopril 10 Mg Tabs (Lisinopril) .Marland Kitchen... Take 1 tablet by mouth at bedtime    Glucovance 5-500 Mg Tabs (Glyburide-metformin) .Marland Kitchen... 1 tab  twice a day    Lantus Solostar 100 Unit/ml Soln (Insulin glargine) ..... Increase dose to 25 units    Lantus 100 Unit/ml Soln (Insulin glargine) .Marland Kitchen... 28 units qhs  Complete Medication List: 1)  Albuterol 90 Mcg/act Aers (Albuterol) .... Inhale 2 puff using inhaler once a day as needed 2)  Lasix 20 Mg Tabs (Furosemide) .... Take 1 tablet by mouth every morning 3)  Mevacor 20 Mg Tabs (Lovastatin) .... Take one tablet by mouth daily 4)  Actos 45 Mg Tabs (Pioglitazone hcl) .... Take 1 tablet by mouth once a  day 5)  Adult Aspirin Ec Low Strength 81 Mg Tbec (Aspirin) .... Take 1 tablet by mouth every other day 6)  Lisinopril 10 Mg Tabs (Lisinopril) .... Take 1 tablet by mouth at bedtime 7)  Buspar 10 Mg Tabs (Buspirone hcl) .... Take 2 tabs at bedtime 8)  Glucovance 5-500 Mg Tabs (Glyburide-metformin) .Marland Kitchen.. 1 tab  twice a day 9)  Zantac 150 Mg Tabs (Ranitidine hcl) .... Take one by mouth twice a day 10)  Lutein 20 Mg Caps (Lutein) .... Otc as directed 11)  Aleve 220 Mg Tabs (Naproxen sodium) .... Otc one tablet twice a day 12)  Prozac 20 Mg Caps (Fluoxetine hcl) .... Take 3 each day 13)  Lantus Solostar 100 Unit/ml Soln (Insulin glargine) .... Increase dose to 25 units 14)  Onetouch Ultra Test Strp (Glucose blood) .... Test sugars four times daily. 15)  Lantus 100 Unit/ml Soln (Insulin glargine) .... 28 units qhs   Orders Added: 1)  Est. Patient Level IV [[address]]    Current Allergies (reviewed today): ! CODEINE ! * REGULAR ASPIRIN

## 2010-10-16 NOTE — Letter (Signed)
Summary: [facility] Instructions  [facility]  [address] [place], [place] [address]   Phone: [phone]  Fax: [phone]       [name]    January 12, 1951    MRN: [id number]       Procedure Day Dorna Bloom:  [name]  02/22/10     Arrival Time:  8:00AM     Procedure Time:  9:00AM     Location of Procedure:                    X  [facility] (4th Floor)    PREPARATION FOR COLONOSCOPY WITH MIRALAX  Starting 5 days prior to your procedure 02/17/10 do not eat nuts, seeds, popcorn, corn, beans, peas,  salads, or any raw vegetables.  Do not take any fiber supplements (e.g. Metamucil, Citrucel, and Benefiber). ____________________________________________________________________________________________________   THE DAY BEFORE YOUR PROCEDURE         DATE: 02/21/10  DAY: WEDNESDAY  1   Drink clear liquids the entire day-NO SOLID FOOD  2   Do not drink anything colored red or purple.  Avoid juices with pulp.  No orange juice.  3   Drink at least 64 oz. (8 glasses) of fluid/clear liquids during the day to prevent dehydration and help the prep work efficiently.  CLEAR LIQUIDS INCLUDE: Water Jello Ice Popsicles Tea (sugar ok, no milk/cream) Powdered fruit flavored drinks Coffee (sugar ok, no milk/cream) Gatorade Juice: apple, white grape, white cranberry  Lemonade Clear bullion, consomm, broth Carbonated beverages (any kind) Strained chicken noodle soup Hard Candy  4   Mix the entire bottle of Miralax with 64 oz. of Gatorade/Powerade in the morning and put in the refrigerator to chill.  5   At 3:00 pm take 2 Dulcolax/Bisacodyl tablets.  6   At 4:30 pm take one Reglan/Metoclopramide tablet.  7  Starting at 5:00 pm drink one 8 oz glass of the Miralax mixture every 15-20 minutes until you have finished drinking the entire 64 oz.  You should finish drinking prep around 7:30 or 8:00 pm.  8   If you are nauseated, you may take the 2nd Reglan/Metoclopramide  tablet at 6:30 pm.        9    At 8:00 pm take 2 more DULCOLAX/Bisacodyl tablets.     THE DAY OF YOUR PROCEDURE      DATE:  02/22/10  DAY: [name]  You may drink clear liquids until 7:00AM  (2 HOURS BEFORE PROCEDURE).   MEDICATION INSTRUCTIONS  Unless otherwise instructed, you should take regular prescription medications with a small sip of water as early as possible the morning of your procedure.  Diabetic patients - see separate instructions.    Additional medication instructions: _  _         OTHER INSTRUCTIONS  You will need a responsible adult at least 62 years of age to accompany you and drive you home.   This person must remain in the waiting room during your procedure.  Wear loose fitting clothing that is easily removed.  Leave jewelry and other valuables at home.  However, you may wish to bring a book to read or an iPod/MP3 player to listen to music as you wait for your procedure to start.  Remove all body piercing jewelry and leave at home.  Total time from sign-in until discharge is approximately 2-3 hours.  You should go home directly after your procedure and rest.  You can resume normal activities the day  after your procedure.  The day of your procedure you should not:   Drive   Make legal decisions   Operate machinery   Drink alcohol   Return to work  You will receive specific instructions about eating, activities and medications before you leave.   The above instructions have been reviewed and explained to me by  Clide Cliff, RN _______________________    I fully understand and can verbalize these instructions _____________________________ Date _______

## 2010-10-16 NOTE — Progress Notes (Signed)
Summary: prozac  Phone Note Refill Request Call back at Home Phone [phone] Message from:  Patient on December 28, 2009 3:10 PM  Refills Requested: Medication #1:  PROZAC 20 MG CAPS take 3 each day Uses [facility] [address]. Patient says that she is leaving for a trip tomorrrow and needs her refill before she leaves. She says that she has contacted pharmacy several times and they have told her that they faxed [place] the request 3 or more times. I do not see that we have recieved the fax. [facility] [address]   Initial call taken by: Melody Comas,  December 28, 2009 3:14 PM Caller: Patient    Prescriptions: PROZAC 20 MG CAPS (FLUOXETINE HCL) take 3 each day  #90 x 6   Entered and Authorized by:   Ruthe Mannan MD   Signed by:   Ruthe Mannan MD on 12/28/2009   Method used:   Electronically to        [facility] [address] (retail)       [address]       [place], [place]  [address]       Ph: [phone]       Fax: [phone]   RxID:   [id number]

## 2010-10-16 NOTE — Letter (Signed)
Summary: Diabetic Instructions  [facility]  [address] [place], [place] [address]   Phone: [phone]  Fax: [phone]    [name] [birth year] MRN: [id number]   _  _   ORAL DIABETIC MEDICATION INSTRUCTIONS  The day before your procedure:   Take your diabetic pill as you do normally  The day of your procedure:   Do not take your diabetic pill    We will check your blood sugar levels during the admission process and again in Recovery before discharging you home  ________________________________________________________________________  _  _   INSULIN (LONG ACTING) MEDICATION INSTRUCTIONS (Lantus, NPH, 70/30, Humulin, Novolin-N)   The day before your procedure:   Take  your regular evening dose    The day of your procedure:   Do not take your morning dose    _  _   INSULIN (SHORT ACTING) MEDICATION INSTRUCTIONS (Regular, Humulog, Novolog)   The day before your procedure:   Do not take your evening dose   The day of your procedure:   Do not take your morning dose

## 2010-10-16 NOTE — Letter (Signed)
Summary: Results Follow up Letter  [place] [facility]  [address] [place], [place] [address]   Phone: [phone]  Fax: [phone]    01/24/2010 MRN: [id number]    [name] [address] [place], [place]  [address]    Dear [name],  The following are the results of your recent test(s):  Test         Result    Pap Smear:        Normal __X_  Not Normal _____ Comments: ______________________________________________________ Cholesterol: LDL(Bad cholesterol):         Your goal is less than:         HDL (Good cholesterol):       Your goal is more than: Comments:  ______________________________________________________ Mammogram:        Normal _____  Not Normal _____ Comments:  ___________________________________________________________________ Hemoccult:        Normal _____  Not normal _______ Comments:    _____________________________________________________________________ Other Tests:    We routinely do not discuss normal results over the telephone.  If you desire a copy of the results, or you have any questions about this information we can discuss them at your next office visit.   Sincerely,    Ruthe Mannan,  M.D.  TA:lsf

## 2010-10-16 NOTE — Letter (Signed)
Summary: Patient Notice- Polyp Results  [facility]  [address] [place], [place] [address]   Phone: [phone]  Fax: [phone]        February 23, 2010 MRN: [id number]    [name] [address] [place], [place]  [address]    Dear [name],  I am pleased to inform you that the colon polyp(s) removed during your recent colonoscopy was (were) found to be benign (no cancer detected) upon pathologic examination.The polyp was hyperplastic ( not premalignant)  I recommend you have a repeat colonoscopy examination in 10_ years to look for recurrent polyps, as having colon polyps increases your risk for having recurrent polyps or even colon cancer in the future.  Should you develop new or worsening symptoms of abdominal pain, bowel habit changes or bleeding from the rectum or bowels, please schedule an evaluation with either your primary care physician or with me.  Additional information/recommendations:  _x_ No further action with gastroenterology is needed at this time. Please      follow-up with your primary care physician for your other healthcare      needs.  __ Please call [phone] to schedule a return visit to review your      situation.  __ Please keep your follow-up visit as already scheduled.  __ Continue treatment plan as outlined the day of your exam.  Please call us if you are having persistent problems or have questions about your condition that have not been fully answered at this time.  Sincerely,  Hart Carwin MD  This letter has been electronically signed by your physician.  Appended Document: Patient Notice- Polyp Results letter mailed 6.16.11

## 2010-10-16 NOTE — Miscellaneous (Signed)
Summary: screening colon age--ch.  Clinical Lists Changes  Medications: Added new medication of MIRALAX   POWD (POLYETHYLENE GLYCOL 3350) As directed - Signed Added new medication of REGLAN 10 MG  TABS (METOCLOPRAMIDE HCL) As directed - Signed Added new medication of DULCOLAX 5 MG  TBEC (BISACODYL) As directed - Signed Rx of MIRALAX   POWD (POLYETHYLENE GLYCOL 3350) As directed;  #255gms x 0;  Signed;  Entered by: Clide Cliff RN;  Authorized by: Hart Carwin MD;  Method used: Electronically to [facility] [address], [place], [place], [place]  [address], Ph: [phone], Fax: [phone] Rx of REGLAN 10 MG  TABS (METOCLOPRAMIDE HCL) As directed;  #2 x 0;  Signed;  Entered by: Clide Cliff RN;  Authorized by: Hart Carwin MD;  Method used: Electronically to [facility] [address], [place], [place], [place]  [address], Ph: [phone], Fax: [phone] Rx of DULCOLAX 5 MG  TBEC (BISACODYL) As directed;  #4 x 0;  Signed;  Entered by: Clide Cliff RN;  Authorized by: Hart Carwin MD;  Method used: Electronically to [facility] [address], [place], [place], [place]  [address], Ph: [phone], Fax: [phone]    Prescriptions: DULCOLAX 5 MG  TBEC (BISACODYL) As directed  #4 x 0   Entered by:   Clide Cliff RN   Authorized by:   Hart Carwin MD   Signed by:   Clide Cliff RN on 02/08/2010   Method used:   Electronically to        [facility] [address] (retail)       [address], [address]       [place], [place]  [address]       Ph: [phone]       Fax: [phone]   RxID:   [id number] REGLAN 10 MG  TABS (METOCLOPRAMIDE HCL) As directed  #2 x 0   Entered by:   Clide Cliff RN   Authorized by:   Hart Carwin MD   Signed by:   Clide Cliff RN on 02/08/2010   Method used:   [facility]ronically to        [facility]  [address] (retail)       [address], [address]       [place], [place]  [address]       Ph: [phone]       Fax: [phone]   RxID:   [id number] MIRALAX   POWD (POLYETHYLENE GLYCOL 3350) As directed  #255gms x 0   Entered by:   Clide Cliff RN   Authorized by:   Hart Carwin MD   Signed by:   Clide Cliff RN on 02/08/2010   Method used:   [facility]ronically to        [facility] [address]ail)       [address], [address]       [place], [place]  [address]       Ph: [phone]       Fax: [phone]   RxID:   [id number]

## 2010-10-16 NOTE — Letter (Signed)
Summary: Patient Medical History for Lapband Surgery  Patient Medical History for Lapband Surgery   Imported By: Maryln Gottron 05/03/2010 13:57:13  _____________________________________________________________________  External Attachment:    Type:   Image     Comment:   External Document

## 2010-10-16 NOTE — Progress Notes (Signed)
Summary: Buspar refill  Phone Note Refill Request Call back at [phone] Message from:  [facility] [address] on October 23, 2009 11:39 AM  Refills Requested: Medication #1:  BUSPAR 10 MG  TABS take 2 tabs at bedtime   Last Refilled: 09/18/2009 [facility] [address] request refill. Please advise.    Method Requested: Telephone to Pharmacy Initial call taken by: Lewanda Rife LPN,  October 23, 2009 11:39 AM    Prescriptions: BUSPAR 10 MG  TABS (BUSPIRONE HCL) take 2 tabs at bedtime  #60 x 6   Entered and Authorized by:   Ruthe Mannan MD   Signed by:   Ruthe Mannan MD on 10/23/2009   Method used:   [facility] to        [facility] [address]ail[facility]       [address]       [place], [place]  [address]       Ph: [phone]       Fax: [phone]   RxID:   [id number]

## 2010-10-16 NOTE — Assessment & Plan Note (Signed)
Summary: CPX YEARLY PHYSICAL/JRR   Vital Signs:  Patient profile:   60 year old female Height:      65.5 inches Weight:      273.25 pounds BMI:     44.94 Temp:     98.4 degrees F oral Pulse rate:   80 / minute Pulse rhythm:   regular BP sitting:   112 / 64  (left arm) Cuff size:   large  Vitals Entered By: Delilah Shan CMA Duncan Dull) (Jan 17, 2010 9:09 AM) CC: CPX   History of Present Illness: 60 yo female here for CPX.   DM- does not check sugars regularly.  a1c last checked in 11/10- 7.4, was again 7.4 two weeks ago.  Denies any symptoms of hypoglycemia.  Currently taking Lantus 22 units daily ( we increased from 18 units on 4/15), Actos 45 mg daily, Glucovance 5-500 mg two times a day.   HLD- lipids stable last month on Mevacor 20 mg.  No side effects that she is aware of.  Obesity- was given information about Bariatric surgery at last appt.  She will call them this week to set up an appointment.    Well woman-  had a hysterectomy in 80s, not sure exactly what they took.  Mammogram was normal last week.  needs colonoscopy, Zostavax, pneumovax.   Current Medications (verified): 1)  Albuterol 90 Mcg/act Aers (Albuterol) .... Inhale 2 Puff Using Inhaler Once A Day As Needed 2)  Lasix 20 Mg Tabs (Furosemide) .... Take 1 Tablet By Mouth Every Morning 3)  Mevacor 20 Mg Tabs (Lovastatin) .... Take 1 Tablet By Mouth Once A Day 4)  Actos 45 Mg  Tabs (Pioglitazone Hcl) .... Take 1 Tablet By Mouth Once A Day 5)  Adult Aspirin Ec Low Strength 81 Mg  Tbec (Aspirin) .... Take 1 Tablet By Mouth Every Other Day 6)  Lisinopril 10 Mg  Tabs (Lisinopril) .... Take 1 Tablet By Mouth At Bedtime 7)  Buspar 10 Mg  Tabs (Buspirone Hcl) .... Take 2 Tabs At Bedtime 8)  Glucovance 5-500 Mg  Tabs (Glyburide-Metformin) .Marland Kitchen.. 1 Tab  Twice A Day 9)  Zantac 150 Mg Tabs (Ranitidine Hcl) .... Take One By Mouth Twice A Day 10)  Lutein 20 Mg Caps (Lutein) .... Otc As Directed 11)  Aleve 220 Mg Tabs (Naproxen  Sodium) .... Otc One Tablet Twice A Day 12)  Prozac 20 Mg Caps (Fluoxetine Hcl) .... Take 3 Each Day 13)  Lantus Solostar 100 Unit/ml Soln (Insulin Glargine) .... Increase Dose To 25 Units 14)  Onetouch Ultra Test  Strp (Glucose Blood) .... Test Sugars Four Times Daily.  Allergies: 1)  ! Codeine 2)  ! 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 ovaries ? abn paps, labs confirm ovaries intact-- 1987 hosp-- cellulitis R) ear--5/01 cellulitis-- hosp-- w/ dm mastoiditis-- 4/02 CT head-- L)  mastoiditis-- 4/02 knee sx-- meniscus-- 03 adenosine myoview-- EF 74%-- 9/06 adenosine cardiolite-- low risk-- EF 74%--09/2006 hosp acute-- diastolic CHF, asthma, DM-- 3/3-11/20/06 L retina repair--11/09, 6-03/2009 L eye surgeries at [facility] spring and  summer 2010--6 total as of 07/2009  Family History: Last updated: 02/28/2009 Family History of Coronary Artery Disease:  Family History of Diabetes:   Social History: Last updated: 08/09/2009 Marital Status: Married Children:  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  Alcohol Use - no Drug Use - no  Review of Systems      See HPI General:  Denies malaise. Eyes:  Denies blurring. ENT:  Denies  difficulty swallowing. CV:  Denies chest pain or discomfort and shortness of breath with exertion. Resp:  Denies shortness of breath. GI:  Denies abdominal pain, nausea, and vomiting. GU:  Denies dysuria, incontinence, urinary frequency, and urinary hesitancy. Debbie:  Denies joint pain, joint redness, and joint swelling. Derm:  Denies rash. Neuro:  Denies headaches. Psych:  Denies anxiety and depression.  Physical Exam  General:  alert, well-developed, well-nourished, well-hydrated, and overweight-appearing.  NAD. Head:  Normocephalic and atraumatic without obvious abnormalities. No apparent alopecia or balding. Eyes:  pupils equal and pupils round.   Ears:  R ear normal and L ear normal.   Nose:  External nasal examination shows no deformity or inflammation. Nasal mucosa are pink and moist without lesions or exudates. Mouth:  pharynx pink and moist and no exudates.   Breasts:  No mass, nodules, thickening, tenderness, bulging, retraction, inflamation, nipple discharge or skin changes noted.   Lungs:  normal respiratory effort, no intercostal retractions, no accessory muscle use, and normal breath sounds.   Heart:  normal rate, regular rhythm, and no murmur.   Abdomen:  Bowel sounds positive,abdomen soft and non-tender without masses, organomegaly or hernias noted. Genitalia:  Pelvic Exam:        External: normal female genitalia without lesions or masses        Vagina: normal without lesions or masses        Cervix: absent        Adnexa: normal bimanual exam without masses or fullness        Uterus:absent        Pap smear: performed Extremities:  no edema Neurologic:  alert & oriented X3.     Impression & Recommendations:  Problem # 1:  Preventive Health Care (ICD-V70.0) Reviewed preventive care protocols, scheduled due services, and updated immunizations Discussed nutrition, exercise, diet, and healthy lifestyle.  set up colonscopy today. Awaiting Zostavax, currently out of  stock.  Problem # 2:  HYPERTENSION, BENIGN (ICD-401.1) Assessment: Unchanged stable on current meds. Her updated medication list for this problem includes:    Lasix 20 Mg Tabs (Furosemide) .Marland Kitchen... Take 1 tablet by mouth every morning    Lisinopril 10 Mg Tabs (Lisinopril) .Marland Kitchen... Take 1 tablet by mouth at bedtime  Problem # 3:  DIABETES MELLITUS, TYPE II, CONTROLLED, 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) Assessment: Unchanged Still not at goal.  INcreased lantus another three units.  Given a prescription for new test strips to check up to 4 times daily as we are adjusting her lantus dosage. Her updated medication list for this problem includes:    Actos 45 Mg Tabs (Pioglitazone hcl) .Marland Kitchen... Take 1 tablet by mouth once a day    Adult Aspirin Ec Low Strength 81 Mg Tbec (Aspirin) .Marland Kitchen... Take 1 tablet by mouth every other day    Lisinopril 10 Mg Tabs (Lisinopril) .Marland Kitchen... Take 1 tablet by mouth at bedtime    Glucovance 5-500 Mg Tabs (Glyburide-metformin) .Marland Kitchen... 1 tab  twice a  day    Lantus Solostar 100 Unit/ml Soln (Insulin glargine) ..... Increase dose to 25 units  Complete Medication List: 1)  Albuterol 90 Mcg/act Aers (Albuterol) .... Inhale 2 puff using inhaler once a day as needed 2)  Lasix 20 Mg Tabs (Furosemide) .... Take 1 tablet by mouth every morning 3)  Mevacor 20 Mg Tabs (lovastatin)  .... Take 1 tablet by mouth once a day 4)  Actos 45 Mg Tabs (Pioglitazone hcl) .... Take 1 tablet by mouth once a day 5)  Adult Aspirin Ec Low Strength 81 Mg Tbec (Aspirin) .... Take 1 tablet by mouth every other day 6)  Lisinopril 10 Mg Tabs (Lisinopril) .... Take 1 tablet by mouth at bedtime 7)  Buspar 10 Mg Tabs (Buspirone hcl) .... Take 2 tabs at bedtime 8)  Glucovance 5-500 Mg Tabs (Glyburide-metformin) .Marland Kitchen.. 1 tab  twice a day 9)  Zantac 150 Mg Tabs (Ranitidine hcl) .... Take one by mouth twice a day 10)  Lutein 20 Mg Caps (Lutein) .... Otc as directed 11)  Aleve 220 Mg Tabs (Naproxen sodium) .... Otc one tablet  twice a day 12)  Prozac 20 Mg Caps (Fluoxetine hcl) .... Take 3 each day 13)  Lantus Solostar 100 Unit/ml Soln (Insulin glargine) .... Increase dose to 25 units 14)  Onetouch Ultra Test Strp (Glucose blood) .... Test sugars four times daily.  Other Orders: Pap Smear, Thin Prep ( Collection of) (E4540) Gastroenterology Referral (GI)  Patient Instructions: 1)  Great to see you, [name]. 2)  Please stop by to see Shirlee Limerick on your way out to set up your colonoscopy and make a lab appointment for an ac1 in 3 months (250.0). Prescriptions: ONETOUCH ULTRA TEST  STRP (GLUCOSE BLOOD) Test sugars four times daily.  #120 x 6   Entered and Authorized by:   Ruthe Mannan MD   Signed by:   Ruthe Mannan MD on 01/17/2010   Method used:   Electronically to        [facility] [address]ail[facility]       [address], [address]       [place], [place]  [address]       Ph: [phone]       Fax: [phone]   RxID:   [id number] LANTUS SOLOSTAR 100 UNIT/ML SOLN (INSULIN GLARGINE) increase dose to 25 units  #1 x 11   Entered and Authorized by:   Ruthe Mannan MD   Signed by:   Ruthe Mannan MD on 01/17/2010   Method used:   [facility]ronically to        [facility] [address]       [address]       [place], [place]  [address]       Ph: [phone]       Fax: [phone]   RxID:   [id number]   Current Allergies (reviewed today): ! CODEINE ! * REGULAR ASPIRIN Last Mammogram:  ASSESSMENT: Negative - BI-RADS 1^MM DIGITAL SCREENING (01/09/2010 9:05:00 AM) Mammogram Result Date:  01/09/2010 Mammogram Result:  normal Mammogram Next Due:  1 yr

## 2010-10-16 NOTE — Progress Notes (Signed)
Summary: pt wants to change insulin  Phone Note Call from Patient   Caller: Patient Summary of Call: Pt was given script for lantus solostar but wants the vials instead, please send to [facility] [address].             Lowella Petties CMA  Feb 07, 2010 2:09 PM     New/Updated Medications: LANTUS 100 UNIT/ML SOLN (INSULIN GLARGINE) use as directed Prescriptions: LANTUS 100 UNIT/ML SOLN (INSULIN GLARGINE) use as directed  #3 vials x 3   Entered and Authorized by:   Ruthe Mannan MD   Signed by:   Ruthe Mannan MD on 02/07/2010   Method used:   [facility]ronically to        [facility] [address]       [address], [address]       [place], [place]  [address]       Ph: [phone]       Fax: [phone]   RxID:   [id number]

## 2010-10-16 NOTE — Procedures (Signed)
Summary: Colonoscopy  Patient: [name] Note: All result statuses are Final unless otherwise noted.  Tests: (1) Colonoscopy (COL)   COL Colonoscopy           DONE     [facility]     [address]     [place], [place]  [address]           COLONOSCOPY PROCEDURE REPORT           PATIENT:  [name], [name]  MR#:  [id number]     BIRTHDATE:  06-Nov-1950, 58 yrs. old  GENDER:  female     ENDOSCOPIST:  Hedwig Morton. Juanda Chance, MD     REF. BY:  Ruthe Mannan, M.D.     PROCEDURE DATE:  02/22/2010     PROCEDURE:  Colonoscopy 19147     ASA CLASS:  Class I     INDICATIONS:  Routine Risk Screening     MEDICATIONS:   Versed 6 mg, Fentanyl 75 mcg           DESCRIPTION OF PROCEDURE:   After the risks benefits and     alternatives of the procedure were thoroughly explained, informed     consent was obtained.  Digital rectal exam was performed and     revealed no rectal masses.   The LB CF-[id number] endoscope     was introduced through the anus and advanced to the cecum, which     was identified by both the appendix and ileocecal valve, without     limitations.  The quality of the prep was good, using MiraLax.     The instrument was then slowly withdrawn as the colon was fully     examined.     <<PROCEDUREIMAGES>>           FINDINGS:  Two polyps were found. 8 mm pedunculated polyp removed     from the right colon in 2 pieces,2 mm diminutive polyp removed     from 20 cm The polyp was removed using cold biopsy forceps. Polyp     was snared, then cauterized with monopolar cautery. Retrieval was     successful (see image3 and image4). snare polyp  This was     otherwise a normal examination of the colon (see image5, image2,     and image1).   Retroflexion was not performed.  The scope was then     withdrawn from the patient and the procedure completed.     COMPLICATIONS:  None     ENDOSCOPIC IMPRESSION:     1) Two polyps     2) Otherwise normal examination     RECOMMENDATIONS:     1)  Await pathology results     REPEAT EXAM:  In 5 year(s) for.           ______________________________     Hedwig Morton. Juanda Chance, MD           CC:           n.     eSIGNED:   Hedwig Morton. Ethridge Sollenberger at 02/22/2010 09:45 AM           Orvilla Cornwall, [phone]  Note: An exclamation mark (!) indicates a result that was not dispersed into the flowsheet. Document Creation Date: 02/22/2010 9:46 AM _______________________________________________________________________  (1) Order result status: Final Collection or observation date-time: 02/22/2010 09:39 Requested date-time:  Receipt date-time:  Reported date-time:  Referring Physician:   Ordering Physician: Lina Sar [id number]) Specimen Source:  Source:  EndoProS Filler Order Number: [id number] Lab site:   Appended Document: Colonoscopy recall colon 10 years

## 2010-10-18 NOTE — Assessment & Plan Note (Signed)
Summary: Follow-up after lap band surgery   Vital Signs:  Patient profile:   60 year old female Height:      65.5 inches Weight:      256.75 pounds BMI:     42.23 Temp:     98.7 degrees F oral Pulse rate:   68 / minute Pulse rhythm:   regular BP sitting:   100 / 60  (left arm) Cuff size:   large  Vitals Entered By: Linde Gillis CMA Duncan Dull) (September 28, 2010 2:52 PM) CC: follow up after lap band surgery   History of Present Illness: 60 yo here follow up after lab band surgery..  Had lap band procedure on 08/21/10.  Has already lost 15 pounds.  Feels better than she has in 20 years.  Has a follow up appt scheduled for 10/05/10.  Denies any abdominal pain. Did vomit once but she ate bread and steak and knew she was not supposed to eat those foods.  Feels less short of breath when she walks.  DM- CBGs have been lower than have been in years.  Last a1c was 8.1 in 07/2010. Denies any episodes of hypoglycemia. Fasting CBGs 70-`09, non fasting lower than 150. Taking Lantus 28 units at bedtime, Glucvance 5-500 1 tab two times a day and Actos 45 mg daily.  Current Medications (verified): 1)  Albuterol 90 Mcg/act Aers (Albuterol) .... Inhale 2 Puff Using Inhaler Once A Day As Needed 2)  Lasix 20 Mg Tabs (Furosemide) .... Take 1 Tablet By Mouth Every Morning 3)  Mevacor 20 Mg Tabs (Lovastatin) .... Take One Tablet By Mouth Daily 4)  Adult Aspirin Ec Low Strength 81 Mg  Tbec (Aspirin) .... Take 1 Tablet By Mouth Every Other Day 5)  Lisinopril 10 Mg  Tabs (Lisinopril) .... Take 1 Tablet By Mouth At Bedtime 6)  Buspar 10 Mg  Tabs (Buspirone Hcl) .... Take 2 Tabs At Bedtime 7)  Glucovance 5-500 Mg  Tabs (Glyburide-Metformin) .Marland Kitchen.. 1 Tab  Twice A Day 8)  Zantac 150 Mg Tabs (Ranitidine Hcl) .... Take One By Mouth Twice A Day 9)  Lutein 20 Mg Caps (Lutein) .... Otc As Directed 10)  Aleve 220 Mg Tabs (Naproxen Sodium) .... Otc One Tablet Twice A Day 11)  Prozac 20 Mg Caps (Fluoxetine Hcl) ....  Take 3 Each Day 12)  Onetouch Ultra Test  Strp (Glucose Blood) .... Test Sugars Four Times Daily. 13)  Lantus 100 Unit/ml Soln (Insulin Glargine) .... 20 Units Qhs  Allergies: 1)  ! Codeine 2)  ! 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 ovaries ? abn paps, labs confirm ovaries intact-- 1987 hosp-- cellulitis R) ear--5/01 cellulitis-- hosp-- w/ dm mastoiditis-- 4/02 CT head-- L)  mastoiditis-- 4/02 knee sx-- meniscus-- 03 adenosine myoview-- EF 74%-- 9/06 adenosine cardiolite-- low risk-- EF 74%--09/2006 hosp acute-- diastolic CHF, asthma, DM-- 3/3-11/20/06 L retina repair--11/09, 6-03/2009 L [facility] surgeries [facility] spring and summer 2010--6 total as of 07/2009  Family History: Last updated: 02/28/2009 Family History of Coronary Artery Disease:  Family History of Diabetes:  Social History: Last updated: 08/09/2009 Marital Status: Married Children:  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  Alcohol Use - no Drug Use - no  Risk Factors: Smoking Status: never (02/28/2009)  Review of Systems      See HPI General:  Denies malaise. GI:  Denies abdominal pain.  Physical Exam  General:  alert, well-developed, well-nourished,  well-hydrated, and overweight-appearing.  NAD. Looks great! Lungs:  normal respiratory effort, no intercostal retractions, no accessory muscle use, and normal breath sounds.   Heart:  normal rate, regular rhythm, and no murmur.   Psych:  normally interactive, depressed affect, flat affect, and subdued.     Impression & Recommendations:  Problem # 1:  OBESITY, UNSPECIFIED (ICD-278.00) Assessment Improved Congratulated on her weight loss. Will request records from surgery.  Problem # 2:  DIABETES MELLITUS, TYPE II, CONTROLLED, 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) Assessment: Improved D/c actos and decrease her lantus to 20 units at bedtime. recheck a1c in 1 month.  The following medications were removed from the medication list:    Actos 45 Mg Tabs (Pioglitazone hcl) .Marland Kitchen... Take 1 tablet by mouth once a day    Lantus Solostar 100 Unit/ml Soln (Insulin glargine) ..... Increase dose to 25 units Her updated medication list for this problem includes:    Adult Aspirin Ec Low Strength 81 Mg Tbec (Aspirin) .Marland Kitchen... Take 1 tablet by mouth every other day    Lisinopril 10 Mg Tabs (Lisinopril) .Marland Kitchen... Take 1 tablet by mouth at bedtime    Glucovance 5-500 Mg Tabs (Glyburide-metformin) .Marland Kitchen... 1 tab  twice a day    Lantus 100 Unit/ml Soln (Insulin glargine) .Marland Kitchen... 20 units qhs  Complete Medication List: 1)  Albuterol 90 Mcg/act Aers (Albuterol) .... Inhale 2 puff using inhaler once a day as needed 2)  Lasix 20 Mg Tabs (Furosemide) .... Take 1 tablet by mouth every morning 3)  Mevacor 20 Mg Tabs (Lovastatin) .... Take one tablet by mouth daily 4)  Adult Aspirin Ec Low Strength 81 Mg Tbec (Aspirin) .... Take 1 tablet by mouth every other day 5)  Lisinopril 10 Mg Tabs (Lisinopril) .... Take 1 tablet by mouth at bedtime 6)  Buspar 10 Mg Tabs (Buspirone hcl) .... Take 2 tabs at bedtime 7)  Glucovance 5-500 Mg Tabs (Glyburide-metformin) .Marland Kitchen.. 1 tab  twice a day 8)  Zantac 150 Mg Tabs (Ranitidine hcl) .... Take one by  mouth twice a day 9)  Lutein 20 Mg Caps (Lutein) .... Otc as directed 10)  Aleve 220 Mg Tabs (Naproxen sodium) .... Otc one tablet twice a day 11)  Prozac 20 Mg Caps (Fluoxetine hcl) .... Take 3 each day 12)  Onetouch Ultra Test Strp (Glucose blood) .... Test sugars four times daily. 13)  Lantus 100 Unit/ml Soln (Insulin glargine) .... 20 units qhs  Patient Instructions: 1)  Stop the Actos, decrease the Lantus 20 units. 2)  Please come for lab visit for a1c (250.00) in one month.     Orders Added: 1)  Est. Patient Level IV [04540]    Current Allergies (reviewed today): ! CODEINE ! * REGULAR ASPIRIN

## 2010-10-18 NOTE — Letter (Signed)
Summary: [facility]   Imported By: Lanelle Bal 10/02/2010 08:24:14  _____________________________________________________________________  External Attachment:    Type:   Image     Comment:   External Document  Appended Document: [facility] doing well s/p lap band

## 2010-10-29 ENCOUNTER — Other Ambulatory Visit (INDEPENDENT_AMBULATORY_CARE_PROVIDER_SITE_OTHER): Payer: Self-pay

## 2010-10-29 ENCOUNTER — Encounter (INDEPENDENT_AMBULATORY_CARE_PROVIDER_SITE_OTHER): Payer: Self-pay | Admitting: *Deleted

## 2010-10-29 ENCOUNTER — Other Ambulatory Visit: Payer: Self-pay | Admitting: Family Medicine

## 2010-10-29 DIAGNOSIS — E1159 Type 2 diabetes mellitus with other circulatory complications: Secondary | ICD-10-CM

## 2010-10-29 LAB — HEMOGLOBIN A1C: Hgb A1c MFr Bld: 6.9 % — ABNORMAL HIGH (ref 4.6–6.5)

## 2010-11-27 LAB — COMPREHENSIVE METABOLIC PANEL
ALT: 24 U/L (ref 0–35)
CO2: 28 mEq/L (ref 19–32)
Calcium: 9.3 mg/dL (ref 8.4–10.5)
Creatinine, Ser: 0.76 mg/dL (ref 0.4–1.2)
GFR calc non Af Amer: >60 mL/min (ref >60)
Glucose, Bld: 153 mg/dL — ABNORMAL HIGH (ref 70–99)
Total Bilirubin: 0.5 mg/dL (ref 0.3–1.2)

## 2010-11-27 LAB — CBC
HCT: 32.7 % — ABNORMAL LOW (ref 36.0–46.0)
HCT: 35.1 % — ABNORMAL LOW (ref 36.0–46.0)
Hemoglobin: 10.6 g/dL — ABNORMAL LOW (ref 12.0–15.0)
Hemoglobin: 11.4 g/dL — ABNORMAL LOW (ref 12.0–15.0)
MCH: 26.4 pg (ref 26.0–34.0)
MCHC: 32.4 g/dL (ref 30.0–36.0)
MCHC: 32.5 g/dL (ref 30.0–36.0)

## 2010-11-27 LAB — GLUCOSE, CAPILLARY
Glucose-Capillary: 136 mg/dL — ABNORMAL HIGH (ref 70–99)
Glucose-Capillary: 142 mg/dL — ABNORMAL HIGH (ref 70–99)
Glucose-Capillary: 144 mg/dL — ABNORMAL HIGH (ref 70–99)
Glucose-Capillary: 152 mg/dL — ABNORMAL HIGH (ref 70–99)

## 2010-11-27 LAB — DIFFERENTIAL
Basophils Absolute: 0 10*3/uL (ref 0.0–0.1)
Eosinophils Absolute: 0.1 10*3/uL (ref 0.0–0.7)
Lymphocytes Relative: 17 % (ref 12–46)
Lymphocytes Relative: 25 % (ref 12–46)
Lymphs Abs: 1.6 10*3/uL (ref 0.7–4.0)
Monocytes Absolute: 0.3 10*3/uL (ref 0.1–1.0)
Monocytes Relative: 5 % (ref 3–12)
Neutro Abs: 5.6 10*3/uL (ref 1.7–7.7)
Neutrophils Relative %: 69 % (ref 43–77)

## 2010-12-03 ENCOUNTER — Ambulatory Visit: Payer: Self-pay | Admitting: *Deleted

## 2010-12-03 LAB — GLUCOSE, CAPILLARY
Glucose-Capillary: 108 mg/dL — ABNORMAL HIGH (ref 70–99)
Glucose-Capillary: 97 mg/dL (ref 70–99)

## 2010-12-15 ENCOUNTER — Other Ambulatory Visit: Payer: Self-pay | Admitting: Family Medicine

## 2010-12-20 ENCOUNTER — Other Ambulatory Visit: Payer: Self-pay | Admitting: *Deleted

## 2010-12-20 MED ORDER — RANITIDINE HCL 150 MG PO CAPS: 150.0000 mg | ORAL_CAPSULE | Freq: Two times a day (BID) | ORAL | Status: DC

## 2011-01-19 ENCOUNTER — Other Ambulatory Visit: Payer: Self-pay | Admitting: Family Medicine

## 2011-01-26 ENCOUNTER — Other Ambulatory Visit: Payer: Self-pay | Admitting: Family Medicine

## 2011-01-29 NOTE — Telephone Encounter (Signed)
Rx called to pharmacy

## 2011-01-29 NOTE — Assessment & Plan Note (Signed)
[facility] OFFICE NOTE   NAME:[name], [name]                     MRN:          [id number]  DATE:03/01/2009                            DOB:          04-02-1951    [name] comes in today for followup of her chronic diastolic heart  failure.  This is felt to be multifactorial and her weight has a major  component of this.  She is also diabetic and is hypertensive.   She is having no complaints today except some visual problems in the  left eye.  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  She is seeing Dr. Alan Mulder in [place].   MEDICATIONS:  Unchanged since her last visit except that her glyburide  and metformin is now 1 p.o. b.i.d.  She has also increased her dose of  buspirone to 20 mg nightly.   PHYSICAL EXAMINATION:  GENERAL:  She is in no acute distress.  VITAL SIGNS:  Her weight has gone up to 267 from 264, blood pressure is  good at 135/81, her heart rate is 73.  HEENT:  Unchanged.  NECK:  Supple.  Carotid upstrokes are equal bilaterally without bruits.  No JVD.  Thyroid is not enlarged.  Trachea is midline.  LUNGS:  Clear to  auscultation and percussion.  HEART:  A nondisplaced PMI, it is difficult to appreciate.  She has a  normal S1 and S2.  No gallop.  ABDOMEN:  Obese with good bowel sounds.  Organomegaly cannot be  adequately assessed.  EXTREMITIES:  No significant edema.  Pulses are present.  NEURO:  Intact.  There is no sign of DVT.  SKIN:  Unremarkable.   ASSESSMENT AND PLAN:  [name] is stable from my standpoint.  I have  made no changes in the medical program.  We will plan on seeing her back  again in a year.      Thomas C. Daleen Squibb, MD, [facility]  Electronically Signed    TCW/MedQ  DD: 03/01/2009  DT: 03/02/2009  Job #: [id number]

## 2011-01-29 NOTE — Assessment & Plan Note (Signed)
[facility] OFFICE NOTE   NAME:[name], [name]                     MRN:          [id number]  DATE:07/19/2008                            DOB:          08-27-1951    Ms. [name] returns today for followup of her chronic diastolic heart  failure.  She has some medical insurance paper she also wanted to fill  it out which I spent about 20 minutes doing so.  It basically states  when she was in the hospital, when she had been in the office, and when  she was to be out of work.  It has been over a year since we have seen  her in the cardiology office.   She denies any active symptoms of congestive heart failure.  There is  some dyspnea on exertion.  She has really not had any major peripheral  edema.  No orthopnea.  She denies any tachy palpitations, chest pain,  syncope.   Please see her previous notes for her extensive evaluation.   Her current meds are:  1. Lantus 15 units q.p.m.  2. Lisinopril 10 mg q.a.m.  3. Lovastatin 20 mg q.p.m.  4. Buspirone 10 mg q.p.m.  5. Fluoxetine 40 mg q.p.m.  6. Furosemide 20 mg q.p.m.  7. Actos 45 mg daily.  8. Glyburide/metformin 5/500 two b.i.d.  9. Ranitidine 150 mg a day.  10.Aspirin 81 mg a day,  11.Aleve 220 mg b.i.d.   PHYSICAL EXAMINATION:  GENERAL:  Today, she is very pleasant. She is  alert and oriented x3.  Her affect is normal.  VITAL SIGNS:  Her blood pressure is 136/62, her pulse is 82 and regular.  She is extremely overweight with a weight of 264 which is up 4 pounds  since September 2008.  HEENT:  Normal.  NECK:  Carotid upstrokes were equal bilaterally without bruits.  No JVD.  Thyroid is not enlarged.  Trachea is midline.  LUNGS:  Clear to auscultation and percussion.  HEART:  A poorly appreciated PMI.  There is no S4.  There is no obvious  lift.  S2 splits physiologically.  ABDOMEN:  Soft.  Good bowel sounds.  Organomegaly cannot be assessed  because  of her obesity.  EXTREMITIES:  Very little edema.  Pulses were present, bilaterally  symmetrical.  NEUROLOGIC:  Grossly intact.   Her electrocardiogram is essentially normal.   ASSESSMENT AND PLAN:  Ms.  Damico is stable from our standpoint.  She  is on a good program.  She was encouraged to lose weight.  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  I filled out all  her papers.  We will see her back in 6 months to a year.     Thomas C. Daleen Squibb, MD, [facility]  Electronically Signed    TCW/MedQ  DD: 07/19/2008  DT: 07/19/2008  Job #: [id number]

## 2011-01-29 NOTE — Op Note (Signed)
[name], [name]              ACCOUNT NO.:  [id number]   MEDICAL RECORD NO.:  [id number]          PATIENT TYPE:  OIB   LOCATION:  5118                         FACILITY:  [facility]   PHYSICIAN:  Beulah Gandy. Ashley Royalty, M.D. DATE OF BIRTH:  22-Apr-1951   DATE OF PROCEDURE:  07/26/2008  DATE OF DISCHARGE:                               OPERATIVE REPORT   ADMISSION DIAGNOSES:  Vitreous hemorrhage, proliferative diabetic  retinopathy, posterior vitreous membranes, left eye.   PROCEDURES:  Pars plana vitrectomy with 25-gauge system, retinal  photocoagulation, membrane peel, gas-fluid exchange, left eye.   SURGEON:  Beulah Gandy. Ashley Royalty, MD   ASSISTANT:  Rosalie Doctor, MA   ANESTHESIA:  General.   DETAILS:  Usual prep and drape, 25-gauge trocars placed at 10, 2, and 4  o'clock.  Infusion at 4 o'clock.  Provisc placed on the corneal surface.  The BIOM viewing system was moved into place.  Pars plana vitrectomy was  begun just behind the crystalline lens where dense vitreous hemorrhage  was encountered.  This was carefully removed under low suction and rapid  cutting down to the macular surface where membranes extended from the  disk to the upper arcade.  The lighted pick was used to engage the  membrane and removed it from its attachment to the retinal surface.  Vitrectomy was carried into the periphery down to the vitreous base for  360 degrees.  Vitreous base was trimmed.  An area of neovascularization  was seen in the upper nasal quadrant.  This was removed and treated with  laser.  The endolaser was positioned in the eye.  1690 burns placed  around the periphery.  The power was 2000 milliwatts, 1000 microns each,  and 0.1 seconds each.  Additional vitrectomy was performed removing  particles and blood from the vitreous cavity.  When it was totally  cleaned, a 30% gas-fluid exchange was performed.  The instruments were  removed from the eye, and the wounds were tested and found to be tight.  Conjunctiva was allowed to slide over the scleral wound.  No gas  egressed from the wounds.  Polymyxin and gentamicin were irrigated into  [place] space.  Atropine solution was applied.  Marcaine was injected  around the globe for postop pain.  Decadron 10 mg was injected into the  lower subconjunctival space.  Polysporin ophthalmic ointment and a patch  and shield were placed.  Closing pressure was 15 with a [place]  tonometer.   COMPLICATIONS:  None.   DURATION:  30 minutes.   DISPOSITION:  The patient is awakened and taken to recovery in  satisfactory condition.      Beulah Gandy. Ashley Royalty, M.D.  Electronically Signed     JDM/MEDQ  D:  07/26/2008  T:  07/27/2008  Job:  [id number]

## 2011-01-29 NOTE — Assessment & Plan Note (Signed)
[facility] OFFICE NOTE   NAME:Saine, [name]                     MRN:          [id number]  DATE:06/05/2007                            DOB:          11-16-1950    I was asked by Everrett Coombe to evaluate Orvilla Cornwall who carries a  diagnosis of chronic diastolic congestive heart failure.   HISTORY OF PRESENT ILLNESS:  She is 60years of age, married to one of my  patients and has 2 children.   She was admitted in March 2008 with wheezing and shortness of breath.  She was diagnosed with acute diastolic congestive heart failure.  She  had had a previous stress Myoview in January 2008 which showed an EF of  74% which was low risk.  She had a question of apical ischemia or  probable soft tissue attenuation.  Considering her body habitus, I would  suspect the latter.   During her hospitalization, she had interstitial edema on her chest x-  ray.  Several days after admission, her BNP was actually normal.  Her 2  D echocardiogram showed an EF of 60-65% with grade 1 diastolic  dysfunction, question some mild aortic stenosis.  She was diuresed and  improved.  Her risk factors for diastolic heart failure include age,  obesity, sedentary lifestyle, diabetes, hypertension.   She is on an excellent medical program and is currently stable.  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   PAST MEDICAL HISTORY:  She is intolerant of codeine.   MEDICATIONS:  1. Lantus 15 units q.p.m.  2. Lisinopril 10 mg daily.  3. Glyburide.  4. Metformin 5/500 b.i.d.  5. Ranitidine 150 b.i.d.  6. Lovastatin 20 mg q.h.s.  7. Buspirone 10 mg q.p.m.  8. Fluoxetine 40 mg q.p.m.  9. Aspirin 81 mg daily.  10.Furosemide 20 mg q.a.m.  11.Actos 45 mg q.a.m.   SURGICAL HISTORY:  Had a hysterectomy in 1986.   FAMILY HISTORY:   FAMILY HISTORY:  Remarkable for heart disease and diabetes.   SOCIAL HISTORY:  She is a bus Hospital doctor.  She  is married and has 2  children.   REVIEW OF SYSTEMS:  Other than the HPI, she has a history of fatigue and  asthma.   EXAM TODAY:  Her blood pressure was excellent at 122/68.  Her pulse is  60 and regular.  She is 5 feet 6 inches, weighs 261.  HEENT:  She has a ruddy complexion.  PERRLA.  Extraocular movements  intact.  Sclerae clear.  Facial symmetry is normal.  Carotids are full.  There are no bruits.  There is no thyromegaly.  JVD  was not obvious.  LUNGS:  Clear to auscultation and percussion with no wheezing.  HEART:  Regular rate and rhythm.  PMI was hard to appreciate.  There is  no S4, however.  ABDOMEN:  Soft, good bowel sounds, no midline bruit.  EXTREMITIES:  Trace edema, and she is wearing support hose up to her  knees.  Pulses were intact.  NEUROLOGIC:  Intact.  SKIN:  Unremarkable.   Electrocardiogram is normal.   ASSESSMENT:  1. Chronic diastolic congestive heart failure.  She has multiple risk      factors which I have reviewed over about a 30 minute discussion.      Fortunately, most of these are reversible other than age.  She      really needs a therapeutic lifestyle change as we reinforced today.  2. She may have some coronary disease, but she has a low risk scan.      She is really not having any symptoms of ischemia at present.      Therefore, I do not think cardiac catheterization is warranted at      this time.  3. She is high risk, as she knows, for having a stroke, heart attack,      or other vascular disease.   I am going to see her back in 6 months to see how she is doing.  I am  happy with her medical program, and I will not add a beta blocker with  her history of bronchospasm and asthma.     Thomas C. Daleen Squibb, MD, [facility]  Electronically Signed    TCW/MedQ  DD: 06/05/2007  DT: 06/05/2007  Job #: [id number]   cc:   Willaim Sheng D. Bean, FNP

## 2011-01-29 NOTE — Discharge Summary (Signed)
NAME:  [name], [name]              ACCOUNT NO.:  [id number]   MEDICAL RECORD NO.:  [id number]          PATIENT TYPE:  INP   LOCATION:  4715                         FACILITY:  [facility]   PHYSICIAN:  Valerie A. Felicity Coyer, MDDATE OF BIRTH:  09-20-1950   DATE OF ADMISSION:  11/26/2007  DATE OF DISCHARGE:  12/01/2007                               DISCHARGE SUMMARY   DISCHARGE DIAGNOSES:  1. Acute asthma exacerbation, with question of underlying vocal cord      dysfunction.  2. Diabetes type 2, with hyperglycemia, exacerbated by steroids.  3. Chronic diastolic heart failure.  4. Dyslipidemia.  5. Depression/anxiety.  6. Chronic urinary incontinence.   HISTORY OF PRESENT ILLNESS:  [name] is a 60 year old female who  was admitted on November 26, 2007 with a chief complaint of worsening  shortness of breath.  His her medical history is significant for asthma,  chronic diastolic heart failure, type 2 diabetes, and chronic urinary  incontinence.  She had recently been treated with steroids by her  primary care [name] for similar symptoms, which seemed to improve, but  6 days prior this admission she developed shortness of breath.  She was  admitted for further evaluation and treatment.   PAST MEDICAL HISTORY:  1. Asthma.  2. Chronic diastolic heart failure.  3. Diabetes type 2.  4. Chronic urinary incontinence.  5. Dyslipidemia.   COURSE OF HOSPITALIZATION.:  1. Acute asthma exacerbation, with question of underlying vocal cord      dysfunction.  The patient was admitted and was placed on IV      steroids which were transitioned over to oral prednisone.  She also      was noted to have some component of a pseudowheeze and complaints      of sore throat, and a question of vocal cord dysfunction was      raised.  She has improved significantly with the addition of a      nasal steroid.  This will be continued at the time of discharge.      We will also continue the patient on a  prednisone taper and      continue Symbicort at the time of discharge.  2. Diabetes type 2, with hyperglycemia.  The patient was continued on      Lantus.  This has been increased to 20 units daily, with      instructions for tapering down to her previous home dosing.  This      will need close outpatient followup.   MEDICATIONS AT TIME OF DISCHARGE:  1. Actos a 45 mg p.o. daily.  2. Aspirin 81 mg p.o. daily.  3. BuSpar 5 mg p.o. daily.  4. Prozac 40 mg p.o. daily.  5. Lovastatin 20 mg p.o. daily.  6. Prilosec OTC 20 mg tabs 2 tabs p.o. daily.  7. Lasix 20 mg p.o. daily.  8. Mucinex 600 mg p.o. twice daily as needed.  9. Glyburide 5 mg tabs 2 tabs twice daily.  10.Metformin 500 mg tabs 2 tabs twice daily.  11.Lisinopril 10 mg p.o. daily  12.Albuterol 2 puffs q.6 h.  as needed.  13.Symbicort 160/4.5, 2 puffs q.12 h.  14.Prednisone 10 mg tabs, 5 tabs p.o. daily x2 days, then 4 tabs p.o.      daily x2 days, then 3 tabs p.o. daily x2 days, then 2 tabs p.o.      daily x2 days, then 1 tab p.o. daily for 2 days, then stop.  15.Lantus insulin 28 subcutaneously once daily until you are down to 2      tabs of prednisone daily, then decrease Lantus to 15 units once      daily.  When prednisone completed, decrease Lantus to 10 units      daily as before.  16.Flonase 0.05% nasals pray once per each nostril daily.   PERTINENT LABORATORIES:  At time of discharge, hemoglobin 10.7,  hematocrit 31.7.  BUN 21, creatinine 0.79.   FOLLOW UP:  The patient is scheduled to follow up with Everrett Coombe,  nurse practitioner, on December 08, 2007 at 11:30 a.m.  She will also need  consideration for evaluation of her anemia, as her hemoglobin was noted  be 10.7 during this admission.      Sandford Craze, NP      Raenette Rover. Felicity Coyer, MD  Electronically Signed    MO/MEDQ  D:  12/01/2007  T:  12/01/2007  Job:  [id number]   cc:   Billie D. Bean, FNP

## 2011-01-29 NOTE — H&P (Signed)
NAMECHARO, [name]              ACCOUNT NO.:  [id number]   MEDICAL RECORD NO.:  [id number]          PATIENT TYPE:  INP   LOCATION:  4715                         FACILITY:  [facility]   PHYSICIAN:  Kela Millin, M.D.DATE OF BIRTH:  02-12-1951   DATE OF ADMISSION:  11/26/2007  DATE OF DISCHARGE:                              HISTORY & PHYSICAL   PRIMARY CARE PHYSICIAN:  Billie D. Bean, FNP   CHIEF COMPLAINT:  Worsening shortness of breath.   HISTORY OF PRESENT ILLNESS:  The patient is a 60 year old white female  with past medical history significant for asthma, chronic diastolic  congestive heart failure, type 2 diabetes, chronic urinary incontinence  and dyslipidemia who presents with the above complaints.  She states  that she was recently treated by her primary care Mao Lockner for similar  symptoms with steroids and she seemed to improve, but about 6 days ago  she developed shortness of breath.  Initially, she states that it was  only exertional, but it has worsened over the past few days and her  inhaler was not working, and she was unable to get an appointment to  see her primary care physician and so came to the ER.  She denies PND,  also no leg swelling.  She admits to chest tightness and a nonproductive  cough., she denies fevers, nausea, vomiting and no diaphoresis.  The  patient also denies dysuria, melena and no diarrhea.   The patient was seen in the ER and an EKG revealed normal sinus rhythm  with no ischemic changes, a chest x-ray was done negative for acute  findings, chronic mild interstitial prominence, no edema.  Point of care  markers were negative and a BNP was less than 30, she is admitted for  further evaluation and management.   PAST MEDICAL HISTORY:  As above.   MEDICATIONS:  1. Actos 45 mg daily.  2. Aleve 220 mg b.i.d.  3. Aspirin 81 mg daily.  4. Buspirone 5 mg daily.  5. Fluoxetine 40 mg daily.  6. Lisinopril 10 mg daily.  7. Lovastatin 20 mg  daily.  8. Metformin/glipizide 5/500 mg b.i.d.  9. Ranitidine 150 mg b.i.d.  10.Lasix 20 mg daily.  11.Lantus 10 units q.h.s.   ALLERGIES:  CODEINE.   SOCIAL HISTORY:  She denies tobacco, also denies alcohol.   FAMILY HISTORY:  Positive for diabetes and heart disease.   REVIEW OF SYSTEMS:  As per HPI, other review of systems negative.   PHYSICAL EXAMINATION:  GENERAL:  The patient is an obese, pleasant,  older white female in no respiratory distress with nasal cannula oxygen  on.  VITAL SIGNS:  Her temperature is 98.1 with a blood pressure of 142/64,  pulse 109, respiratory rate 20, O2 sat of 94%.  HEENT:  PERRL, EOMI, sclerae anicteric.  Moist mucous membranes.  NECK:  Supple, no adenopathy, no thyromegaly and no JVD.  LUNGS:  Scattered bilateral expiratory wheezes, no crackles.  CARDIOVASCULAR:  Regular normal S1-S2.  No S3 appreciated.  ABDOMEN:  Obese, bowel sounds present, nontender, nondistended.  No  organomegaly and no masses palpable.  EXTREMITIES:  No edema.  No cyanosis.   LABORATORY DATA:  Chest x-ray, EKG and point of care markers as per  HPI.Marland Kitchen  Her white cell count is 5, hemoglobin and 11, hematocrit 32.7,  platelet count of 219, neutrophil count 67%.  Sodium is 135 with a  potassium of 4.1, chloride 103, CO2 is 27, glucose is 144, BUN 11,  creatinine 0.77.  BNP is less than 30.   ASSESSMENT/PLAN:  1. Asthma exacerbation.  As discussed above, will place on nebulized      bronchodilators, antibiotics, antitussives and  IV steroids.      Obtain cardiac enzymes.  Chest x-ray as above without acute      infiltrates.  2. Chronic diastolic congestive heart failure.  Stable, BNP is less      than 30.  Continue outpatient medications.  3. Diabetes.  Continue outpatient medications.  4. Dyslipidemia.  Continue lovastatin.  5. Depression.  Continue outpatient medications.      Kela Millin, M.D.  Electronically Signed     ACV/MEDQ  D:  11/27/2007  T:   11/27/2007  Job:  [id number]   cc:   Billie D. Jillyn Hidden, FNP  [address] [place], [place] [address]

## 2011-02-01 NOTE — H&P (Signed)
[name], [name]              ACCOUNT NO.:  [id number]   MEDICAL RECORD NO.:  [id number]          PATIENT TYPE:  INP   LOCATION:  6707                         FACILITY:  [facility]   PHYSICIAN:  Raenette Rover. Felicity Coyer, MDDATE OF BIRTH:  1951-07-13   DATE OF ADMISSION:  11/17/2006  DATE OF DISCHARGE:                              HISTORY & PHYSICAL   CHIEF COMPLAINT:  Short of breath, wheeze.   HISTORY OF PRESENT ILLNESS:  The patient is a 60 year old woman with  history of diabetes, as well as asthma, who has been seen multiple times  by her primary care physician in the Saturday clinic on November 10, 2006, November 15, 2006, and November 17, 2006, for symptoms of persistent  shortness of breath.  This began as a viral syndrome flu per her  report with fevers and cough.  She was treated empirically with  prednisone taper, as well as azithromycin, as well as albuterol MDI and  guaifenesin.  Due to failure to respond, she was seen in Saturday clinic  where there was concern for persistent infection and then placed on a Z-  PAK.  She then followed up with her primary, [name], 48 hours later  due to persistent symptoms without change, despite the previous  treatment, and at which time she is now being referred to inpatient  evaluation and treatment, having failed outpatient management of what  appears to be acute asthma exacerbation.  The patient has not had a  chest x-ray or laboratory data.  She says she feels fine while she is at  rest, but becomes so short of breath with any activity that she has been  unable to eat due to shortness of breath, go to the bathroom due to  shortness of breath, or sleep well due to coughing and shortness of  breath.  She denies chest pain.  She denies leg swelling.  She denies  smoking.  Denies sputum or pleurisy.  She denies nausea or vomiting or  reflux symptoms.  No headache, rashes, or difficulty with medications.   PAST MEDICAL HISTORY:  Significant  for:  1. Type 2 diabetes.  2. Dyslipidemia.  3. Anxiety and depression.  4. Probable GERD.   She had an adenosine Cardiolite January 2008, which is reportedly low  risk with an EF of 79%.  She is status post hysterectomy in 1984.   MEDICATIONS:  Include:  1. Prozac 40 mg daily.  2. Aspirin 81 mg daily.  3. Zestril 10 mg daily.  4. Zantac 150 b.i.d.  5. Mevacor 40 mg daily.  6. Glucovance 5/500 two tablets b.i.d.  7. Actos 30 mg daily.   SHE HAS A REPORTED CODEINE INTOLERANCE.   FAMILY HISTORY:  Noncontributory.   SOCIAL HISTORY:  She is a nonsmoker, but denies regular alcohol use.   REVIEW OF SYSTEMS:  Negative for nausea, vomiting, or diarrhea.  No  abdominal pain.  No sore throat.  No sputum or cough.  No headache.  No  weight changes.  Please see HPI above for further details.   PHYSICAL EXAMINATION:  VITALS:  On arrival to  the hospital, temperature  of 98.0, blood pressure 136/66, pulse of 90, respirations 20, satting  99% on room air.  GENERAL:  She is awake, alert, and in no acute distress.  HEENT:  Unremarkable.  NECK:  Supple without apparent JVD.  LUNGS:  Showing bilateral end expiratory wheezing with at least some  component of upper airway cord dysfunction, fair air movement, no  appreciable crackle or rhonchi, and no increase work of breathing.  CARDIOVASCULAR:  Regular rate and rhythm without abnormal sounds  appreciated.  ABDOMEN:  Soft and nontender with good bowel sounds.  EXTREMITIES:  Show no edema, cyanosis, or erythema.   LABORATORY DATA:  She has a CBC, CMET, and chest x-ray pending, which  have not yet been done.   ASSESSMENT AND PLAN:  1. Apparent acute asthma exacerbation with active wheezing, despite      outpatient management with steroids, albuterol MDI, and empiric Z-      PAK.  Will follow up on labs to rule out other underlying      infection, such as bronchitis or pneumonia following recent history      of reported viral upper  respiratory infection symptoms.  Will place      on empiric IV Avelox and give IV steroids for continued      bronchospasm with Solu-Medrol 80 IV q.8.  Will continue to maximize      reflux medication with consideration of Reglan.  Treat with oxygen      as needed and schedule DuoNeb also for bronchospasm.  Follow up      chest x-ray, and if unremarkable and/or with recurrent persisting      hypoxemia consider CT chest for further evaluation, as well as      possible rule out pulmonary embolism.  Will treat with Lovenox      prophylaxis in the meanwhile.  2. Type 2 diabetes.  The patient unsure of what her CBGs have run at      home.  She is noninsulin dependent prior to this time, but      anticipate exacerbation with steroids as above.  Will add sliding      scale insulin to her home Glucovance, hold Actos pending evaluation      and possible heart failure, though this is unlikely.  Will check      A1c to determine overall control.  3. History of anxiety and depression.  Continue home Prozac as prior      to admission.  4. History of dyslipidemia.  Will continue home Mevacor as prior to      admission.      Valerie A. Felicity Coyer, MD  Electronically Signed     VAL/MEDQ  D:  11/17/2006  T:  11/17/2006  Job:  [id number]

## 2011-02-01 NOTE — Discharge Summary (Signed)
[place]  Patient:    Debbie, Schneider                     MRN: [id number] Adm. Date:  [id number] Disc. Date: [id number] Attending:  [place], [place] CC:         Dr. Roxy Manns, [facility]   Discharge Summary  ADMITTING DIAGNOSES: 1. Cellulitis, preauricular. 2. Diabetes.  DISCHARGE DIAGNOSES: 1. Mastoiditis, left. 2. Diabetes, uncontrolled. 3. Anemia.  HISTORY OF PRESENT ILLNESS:  [name] is a 60 year old, white female who presents with a five-day history of left ear pain.  She has had a progressive feeling of ear tightness.  She was seen by Arizona Constable, N.P. and placed on Ceftin.  She denies any fevers, chills, nausea, vomiting or diarrhea.  She has esophageal reflux and has been treated for cellulitis in July 2001.  HOSPITAL COURSE:  She was admitted and placed on Tequin 400 mg p.o.  The cellulitis was associated with headache in the left temple and neck area with throbbing pain behind the ear.  Her cranial nerve exam was negative and there was no meningismus.  Erythema around the left ear was noted and she was tender in the left neck without lymphadenopathy.  The tenderness was maximized over the left mastoid.  Her white count was 5900 with a normal differential.  She was anemic with a hematocrit of 34.7.  Iron and iron binding capacity were normal, but the iron was low normal at 42 (42-135) and saturation was 15% with normals over 20. Her B12 level was normal at 302.  Pending at this time is a folic acid level. CT of the sinuses was performed which revealed left mastoiditis.  She had been changed to IV Tequin.  Her Glucovance at home was held and Accu-Cheks were monitored.  These ranged from 71 to a high of 193.  Her hemoglobin A1C indicated uncontrolled diabetes with a value of 9.4 despite Glucovance 5/500 two each morning and one in the evening.  She has not had dietary compliance with an ADA diet.  She had not  attended outpatient diabetic teaching as she did not feel her insurance company would cover this.  She is 5 feet 7 inches and her weight was recorded as 242 pounds.  Her safe weight would be less than 195 pounds and ideal weight less than 180 pounds. These goals were discussed with her.  She was on an 1800 calorie ADA diet and nutrition consult was obtained.  Teaching materials were also provided.  She did not have fecal occult testing in the hospital as this was not possible, but it is recommended that the study be performed as an outpatient. Significantly, she has had abdominal hysterectomy so there is no gynecologic etiology of anemia.  Her TSH was normal at 1.01 and FT4 was normal at 1.11 eliminating hypothyroidism playing a role.  The risk of uncontrolled diabetes, weight over 195 pounds, lack of cardiovascular exercise were discussed with her.  Pending at the time of this dictation is a lipid profile.  The last lipid profile was a year or more ago and she states that her total cholesterol was less than 200.  Risks would be an LDL less than 130; ideal LDL would be less than 100 as her mother had heart disease and five vessel bypass.  Additionally, the HDL or good cholesterol should be over 40.  She received teaching materials.  She seems motivated to engage in a preventive  health care program.  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  She will be discharged on the 1800 calorie ADA diet.  Graduated walking program would be recommended building up to 30-45 minutes three to four times a week.  Additionally, the book, "Simply the Best," which is Weight Watchers 250 best recipes would be recommended.  There was no change made in her Glucovance, Zestril, fluoxetine and aspirin. She was changed to Augmentin 875 mg one every 12 hours with food.  The importance on controlling the glucose to  treat the infection adequately was discussed with her.  She was to monitor either her fasting glucose or her two-hour postprandial glucose daily and take those numbers to Dr. Milinda Antis in two weeks.  The fasting blood sugar goal should be less than 120 on average and the two-hour PC less than 180 on average.  She has a prescription for Diflucan and this will be used should she have vaginitis.  She will also be given the option of using this natural supplement, Probiotic, to help prevent antibiotic induced complications.  It was stressed that Augmentin needs to be taken with food.  Additionally, she was given a prescription for ECO diabetic shoes. Clinically, there was no evidence of vascular disease on exam and peripheral circulation was good.  A fasting lipid profile was drawn prior to discharge and completion of stool cards as an outpatient.  CONDITION ON DISCHARGE:  Improved.  Prognosis ultimately depends on control of the risk factors as delineated above. DD:  01/04/01 TD:  01/05/01 Job: [id number]

## 2011-02-01 NOTE — Discharge Summary (Signed)
[name], [name]              ACCOUNT NO.:  [id number]   MEDICAL RECORD NO.:  [id number]          PATIENT TYPE:  INP   LOCATION:  6707                         FACILITY:  [facility]   PHYSICIAN:  Raenette Rover. Felicity Coyer, MDDATE OF BIRTH:  04/16/1951   DATE OF ADMISSION:  11/17/2006  DATE OF DISCHARGE:  11/20/2006                               DISCHARGE SUMMARY   DISCHARGE DIAGNOSES:  1. Acute diastolic congestive heart failure exacerbation  2. Asthma.  3. Diabetes, type 2, uncontrolled  4. Chronic urinary incontinence   HISTORY OF PRESENT ILLNESS:  [name] is a 60 year old white female  who was admitted on November 17, 2006, with a chief complaint of shortness  of breath.  She had been seen multiple times by her primary care  physician in the [facility] on February 25, March 1, and March 3  for symptoms of persistent shortness of breath.  She initially presented  with a viral flu-like symptoms with fever and cough and was treated  empirically with a prednisone taper as well as azithromycin.  She failed  outpatient management and was admitted for further evaluation and  treatment.   PAST MEDICAL HISTORY:  1. Diabetes, type 2.  2. Dyslipidemia.  3. Anxiety and depression.  4. GERD.   COURSE OF HOSPITALIZATION:  1. Acute diastolic CHF exacerbation.  The patient was admitted and      underwent a chest x-ray which showed interstitial edema.  As a      result of these findings, a 2-D echo was performed which showed a      normal left ventricular ejection fraction of 60-65% but did note      grade 1 diastolic dysfunction, question of some mild aortic      stenosis.  She was treated with Lasix and noted good improvement in      clinical symptoms.  The patient continues to wheeze, although her      wheezing has diminish since admission.  She will be continued on a      prednisone taper at time of discharge.  It is unclear whether or      not this is truly an asthma exacerbation, or  if this is secondary      to volume overload.  2. Diabetes, type 2, poorly controlled.  The patient continued on her      home meds.  She was noted to have blood sugars over 400 early in      this admission with the addition of steroids.  A hemoglobin A1c was      checked which was 8.4, indicating poor control prior to admission.      She has been treated with Lantus 20 units daily while inpatient.      She will be sent home on Lantus 15 units daily.  However, she will      need continued monitoring her CBGs as she may require smaller      dosing as the prednisone taper is completed.  The patient underwent      teaching by nursing staff and is comfortable with self-  administration of insulin.   MEDICATIONS:  At time of discharge:  1. Albuterol inhaler two puffs every 4 hours as needed.  2. Prozac 40 mg p.o. daily.  3. Aspirin 81 mg p.o. daily.  4. Zestril 10 mg p.o. daily.  5. Zantac 150 mg p.o. b.i.d.  6. Mevacor 40 mg p.o. daily.  7. Glucovance 5/500, two tablets p.o. b.i.d.  8. Prednisone 50 mg on March 7th and March 8th; then 40 mg on March      9th and March 10th; then 30 mg on March 11th and March 12th; then      20 mg on March 13th and March 14th; then 10 mg on March 15th and      March 16th; then stop.  9. Lasix 20 mg p.o. daily.  10.Lantus insulin 15 units injection subcutaneously once daily at the      evenings.   PERTINENT LABORATORY DATA:  At time of discharge hemoglobin A1c is 8.4.  TSH 0.581.  BUN 10, creatinine 0.61.   DISPOSITION:  The patient will be discharged to home.  She is instructed  to follow up with Billie D. Bean, nurse practitioner, on Tuesday, November 25, 2006 at 11:15 a.m.  At this appointment, she will need re-evaluation  of her Lantus dosing, as well as a B-MET to follow for any needs of  potassium supplementation with the initiation of Lasix.  May also  consider outpatient cardiology consult, will defer to the patient's  primary  care.      Sandford Craze, NP      Raenette Rover. Felicity Coyer, MD  Electronically Signed    MO/MEDQ  D:  11/20/2006  T:  11/20/2006  Job:  [id number]   cc:   Billie D. Bean, FNP

## 2011-02-04 ENCOUNTER — Emergency Department (HOSPITAL_COMMUNITY)
Admission: EM | Admit: 2011-02-04 | Discharge: 2011-02-05 | Disposition: A | Discharge status: Home / Self Care | Payer: BC Managed Care – PPO | Attending: Emergency Medicine | Admitting: Emergency Medicine

## 2011-02-04 DIAGNOSIS — J45909 Unspecified asthma, uncomplicated: Secondary | ICD-10-CM | POA: Insufficient documentation

## 2011-02-04 DIAGNOSIS — M542 Cervicalgia: Secondary | ICD-10-CM | POA: Insufficient documentation

## 2011-02-04 DIAGNOSIS — K219 Gastro-esophageal reflux disease without esophagitis: Secondary | ICD-10-CM | POA: Insufficient documentation

## 2011-02-04 DIAGNOSIS — Z794 Long term (current) use of insulin: Secondary | ICD-10-CM | POA: Insufficient documentation

## 2011-02-04 DIAGNOSIS — I509 Heart failure, unspecified: Secondary | ICD-10-CM | POA: Insufficient documentation

## 2011-02-04 DIAGNOSIS — E119 Type 2 diabetes mellitus without complications: Secondary | ICD-10-CM | POA: Insufficient documentation

## 2011-02-04 DIAGNOSIS — E669 Obesity, unspecified: Secondary | ICD-10-CM | POA: Insufficient documentation

## 2011-02-04 DIAGNOSIS — E7800 Pure hypercholesterolemia, unspecified: Secondary | ICD-10-CM | POA: Insufficient documentation

## 2011-02-04 DIAGNOSIS — R51 Headache: Secondary | ICD-10-CM | POA: Insufficient documentation

## 2011-02-04 DIAGNOSIS — M62838 Other muscle spasm: Secondary | ICD-10-CM | POA: Insufficient documentation

## 2011-02-04 DIAGNOSIS — I10 Essential (primary) hypertension: Secondary | ICD-10-CM | POA: Insufficient documentation

## 2011-02-05 ENCOUNTER — Emergency Department (HOSPITAL_COMMUNITY): Payer: BC Managed Care – PPO

## 2011-02-06 ENCOUNTER — Encounter: Payer: Self-pay | Admitting: Family Medicine

## 2011-02-06 ENCOUNTER — Other Ambulatory Visit: Payer: Self-pay | Admitting: *Deleted

## 2011-02-06 ENCOUNTER — Telehealth: Payer: Self-pay | Admitting: *Deleted

## 2011-02-06 MED ORDER — OXYCODONE-ACETAMINOPHEN 5-325 MG PO TABS: 1.0000 | ORAL_TABLET | ORAL | Status: DC | PRN

## 2011-02-06 MED ORDER — OXYCODONE-ACETAMINOPHEN 5-325 MG PO TABS: 1.0000 | ORAL_TABLET | ORAL | Status: AC | PRN

## 2011-02-06 NOTE — Telephone Encounter (Signed)
Pt states she was seen at ER over the week end for headaches and was given oxycontin 5/325, to take one to two every 6 hours.  She still has the headache and is having to take 2 every 5 hours.  She has an appt to see Dr Para March tomorrow, but she will run out of the medicine before then and is asking if she can get a refill.  Please refill.

## 2011-02-06 NOTE — Telephone Encounter (Signed)
Since she is tolerating percocet ok, I will send in 20 tablets although it does contain hydrocodone.

## 2011-02-06 NOTE — Telephone Encounter (Signed)
Oxycontin does not come in this form. She must mean Percocet or Norco. I'm ok with giving her 20 tablets however we have codeine listed as an allergy. Please call pt to find out exactly what she is taking and what she is allergic to. Either way, not a good long term management to give narcotics for headaches.

## 2011-02-06 NOTE — Telephone Encounter (Signed)
Spoke with patient and she was given Percocet 5/325.  She is allergic to Codeine.  Please send Rx to [facility].

## 2011-02-06 NOTE — Telephone Encounter (Signed)
Rx called to [facility].

## 2011-02-06 NOTE — Telephone Encounter (Signed)
Rx must be picked up by patient, not called in like before.  Patient notified as instructed via telephone.

## 2011-02-07 ENCOUNTER — Ambulatory Visit (INDEPENDENT_AMBULATORY_CARE_PROVIDER_SITE_OTHER): Payer: BC Managed Care – PPO | Admitting: Family Medicine

## 2011-02-07 ENCOUNTER — Encounter: Payer: Self-pay | Admitting: Family Medicine

## 2011-02-07 VITALS — BP 110/62 | HR 71 | Temp 98.4°F | Wt 232.0 lb

## 2011-02-07 DIAGNOSIS — R519 Headache, unspecified: Secondary | ICD-10-CM | POA: Insufficient documentation

## 2011-02-07 DIAGNOSIS — R51 Headache: Secondary | ICD-10-CM | POA: Insufficient documentation

## 2011-02-07 MED ORDER — GABAPENTIN 100 MG PO CAPS: ORAL_CAPSULE | ORAL | Status: DC

## 2011-02-07 NOTE — Patient Instructions (Signed)
Take 1 neurontin today.  Increase to 2 tabs tomorrow and 3 tabs a day thereafter for the pain.  Let us know if you aren't improving.  Shirlee Limerick will call about your referral to neurology.  Take care.

## 2011-02-07 NOTE — Progress Notes (Signed)
HA and neck pain.  Seen at ER 5/23 and notes reviewed.  L side of head, episodic pain that isn't motion or position dependant.  It can happen rhythmically, ie every 10 seconds.  Going on for a few days.  Some temp relief with oxycodone and valium.  No rash, no trauma. CT head unremarkable at ER.  No pain on R side.  No new weakness/numbness in the hands, arms, legs.  Vision at baseline.  No skin changes noted by patient, other than abraded areas where she has been picking.  Meds, vitals, and allergies reviewed.   ROS: See HPI.  Otherwise, noncontributory.  nad but episodically in pain on L side of neck and head. ncat Tm wnl Pupils w/o acute changes, eomi Scalp w/o abnormality seen Nasal and oral exam unremarkable Neck supple, no midline pain, no LA, L occiput ttp rrr ctab CN 2-12 wnl B, S/S/DTR wnl x4 except for old pupil findings Superficial abrasions noted on face and arms from picking.

## 2011-02-07 NOTE — Assessment & Plan Note (Addendum)
Trigger point and/or occipital neuralgia.  >25 min spent with face to face with patient, >50% counseling and coordinating care.  D/w pt.  I would titrate up neurontin and then fu with neuro.  She may end up needing trigger point injections.  She understood, okay for outpatient fu.

## 2011-02-08 ENCOUNTER — Telehealth: Payer: Self-pay | Admitting: *Deleted

## 2011-02-08 NOTE — Telephone Encounter (Signed)
Pt was seen recently and given gabapentin.  She was given oxycodone while in the hospital and is asking if ok to take both.  She says the gabapentin isnt helping her pain.  Please advise.

## 2011-02-08 NOTE — Telephone Encounter (Signed)
The pt has called back again, says she cant stand the pain in her head.  States the gabapentin isnt helping.  Please advise on what she can do.

## 2011-02-08 NOTE — Telephone Encounter (Signed)
Gradually inc the gabapentin dose and see if that helps.  The higher dose may help.

## 2011-02-12 NOTE — Telephone Encounter (Signed)
Pt states she didn't hear back from [place] on Friday so she went back to taking oxycontin.  She wants to discuss this with Dr. Dayton Martes, appt made.

## 2011-02-14 ENCOUNTER — Encounter: Payer: Self-pay | Admitting: Family Medicine

## 2011-02-14 ENCOUNTER — Ambulatory Visit (INDEPENDENT_AMBULATORY_CARE_PROVIDER_SITE_OTHER): Payer: BC Managed Care – PPO | Admitting: Family Medicine

## 2011-02-14 ENCOUNTER — Ambulatory Visit: Payer: BC Managed Care – PPO | Admitting: Family Medicine

## 2011-02-14 VITALS — BP 118/62 | HR 65 | Temp 97.9°F | Ht 65.5 in | Wt 245.8 lb

## 2011-02-14 DIAGNOSIS — R51 Headache: Secondary | ICD-10-CM

## 2011-02-14 MED ORDER — GABAPENTIN 300 MG PO CAPS: 300.0000 mg | ORAL_CAPSULE | Freq: Three times a day (TID) | ORAL | Status: DC

## 2011-02-14 NOTE — Patient Instructions (Signed)
Occipital Neuralgia Neuralgias are attacks of sharp stabbing pain. They may be intermittent (comes and goes) or constant in nature. They may be brief attacks that last seconds to minutes and may come back for days to weeks. The neuralgias can occur as a result of a herpes zoster (shingles), chicken pox infection, or even following a herpes simplex infection (cold sore). NEURALGIA TYPES  When these pains are located in the back of the head and neck they are called occipital neuralgias.   When the pain is located between ribs it is called intercostal neuralgia.   When the pain is located in the face it is called trigeminal neuralgia. This is the most common neuralgia. It causes sharp, shock like pain on one side of your face.  The neuralgias, which follow herpes zoster (shingles) infections, often produce a constant burning pain. They may last from weeks to months and even years. The attacks of pain may come from injury or inflammation (irritation) to a nerve. Often the cause is unknown. The episodes of pain may be caused by light touch, movement, or even eating and sneezing. Usually these neuralgias occur after age 3. The neuralgias following shingles and trigeminal neuralgia are the most common. Although painful, these episodes do not threaten life and tend to lessen as we grow older. TREATMENT There are many medications that may be helpful in the treatment of this disorder. Sometimes several medications may have to be tried before the right combination can be found for you. Some of these medications are:  Only take over-the-counter or prescription medicines for pain, discomfort, or fever as directed by your caregiver.   Narcotic medications may be used to control the pain.   Antidepressants and medications used in epilepsy (seizure disorders) may be useful.  LET YOUR CAREGIVER KNOW ABOUT THE FOLLOWING:  You do not obtain relief from medications.   Your problems are getting worse rather  than better.   You notice troubling side effects that you think are coming from the medication.  Do not be discouraged if you do not obtain instant relief from the medications or help given you. Your caregiver can help you get through these episodes of pain with some persistence (continued trying) on your part also. Document Released: 08/27/2001 Document Re-Released: 09/24/2009 [facility] Patient Information 2011 [place], [place].

## 2011-02-14 NOTE — Progress Notes (Signed)
60 yo here to discuss headaches. Saw Dr. Para March on 5/24 for headaches and neck pain.     Seen at ER 5/23 and notes reviewed.  L side of head, episodic pain that isn't motion or position dependant.  It can happen rhythmically, ie every 10 seconds.  Going on for a few days.  Given oxycodone and valium which provided some relief.     No rash, no trauma.  CT head unremarkable at ER.   No pain on R side.   No new weakness/numbness in the hands, arms, legs.  Vision at baseline.    Dr. Para March felt this was Trigger point and/or occipital neuralgia, placed on neurontin 300 mg daily and referred to neurology (appt not yet made).   Reports that Gabapentin was not helping at first, improved now.  The PMH, PSH, Social History, Family History, Medications, and allergies have been reviewed in [facility], and have been updated if relevant.   ROS: See HPI.  Otherwise, noncontributory.  Physical exam: BP 118/62  Pulse 65  Temp(Src) 97.9 F (36.6 C) (Oral)  Ht 5' 5.5" (1.664 m)  Wt 245 lb 12.8 oz (111.494 kg)  BMI 40.28 kg/m2 Gen:  NAD HEENT: Tm wnl Pupils w/o acute changes, eomi Scalp w/o abnormality seen Nasal and oral exam unremarkable Neck supple, no midline pain, no LA, L occiput ttp CVS:rrr Resp:  ctab Neuro:CN 2-12 wnl B, S/S/DTR wnl x4 except for old pupil findings Superficial abrasions noted on face and arms from picking.  A/p- Headaches Agree with Dr. Lianne Bushy assessment- trigger point and/or occipital neuralgia.care.  >25 min spent with face to face with patient counseling and coordinating care. Increase Gabapentin to 300 mg three times daily Make appt with neuro as may need trigger point injections.

## 2011-02-16 ENCOUNTER — Other Ambulatory Visit: Payer: Self-pay | Admitting: Family Medicine

## 2011-02-18 ENCOUNTER — Ambulatory Visit: Payer: BC Managed Care – PPO | Admitting: Family Medicine

## 2011-03-02 ENCOUNTER — Other Ambulatory Visit: Payer: Self-pay | Admitting: Family Medicine

## 2011-04-23 ENCOUNTER — Ambulatory Visit (INDEPENDENT_AMBULATORY_CARE_PROVIDER_SITE_OTHER): Payer: BC Managed Care – PPO | Admitting: Family Medicine

## 2011-04-23 ENCOUNTER — Encounter: Payer: Self-pay | Admitting: Family Medicine

## 2011-04-23 VITALS — BP 132/82 | HR 73 | Temp 98.4°F | Wt 243.5 lb

## 2011-04-23 DIAGNOSIS — B358 Other dermatophytoses: Secondary | ICD-10-CM

## 2011-04-23 DIAGNOSIS — B359 Dermatophytosis, unspecified: Secondary | ICD-10-CM | POA: Insufficient documentation

## 2011-04-23 MED ORDER — CLOTRIMAZOLE-BETAMETHASONE 1-0.05 % EX CREA: TOPICAL_CREAM | Freq: Two times a day (BID) | CUTANEOUS | Status: DC

## 2011-04-23 NOTE — Progress Notes (Signed)
Subjective:    Patient ID: [name], female    DOB: [birth year], 60 y.o.   MRN: [id number]  HPI 60 yo here for very itchy rash on back of her left thigh.  Has been there for weeks, it seems to be worsening. She cannot see well but her family tells her it is circular, ?ring worm. No other lesions.  Feels like it is growing in size. OTC hydrocortisone helps a little.  Patient Active Problem List  Diagnoses  . DIABETES MELLITUS, TYPE II, CONTROLLED, W/VASCULAR COMPS  . HYPERLIPIDEMIA  . OBESITY, UNSPECIFIED  . ANEMIA  . ANXIETY  . DEPRESSION  . RECENT RETINAL DETACHMENT PARTIAL W/GIANT TEAR  . RETINAL DETACHMENT  . HYPERTENSION, BENIGN  . CHF  . DIASTOLIC HEART FAILURE, CHRONIC  . URI  . ASTHMA, WITH ACUTE EXACERBATION  . LUNG NODULE  . Contact Dermatitis and Other Eczema, due to Unspecified Cause  . WHEEZING  . DYSPNEA ON EXERTION  . POSTMENOPAUSAL STATUS  . Headache   Past Medical History  Diagnosis Date  . Chronic diastolic heart failure   . Congestive heart failure, unspecified   . Other dyspnea and respiratory abnormality   . Essential hypertension, benign   . Other and unspecified hyperlipidemia   . Type II or unspecified type diabetes mellitus with peripheral circulatory disorders, not stated as uncontrolled   . Anxiety state, unspecified   . Depressive disorder, not elsewhere classified   . Unspecified asthma, with exacerbation   . Recent retinal detachment, partial, with giant tear   . Anemia, unspecified   . Wheezing   . Acute upper respiratory infections of unspecified site   . Contact dermatitis and other eczema, due to unspecified cause   . Other diseases of lung, not elsewhere classified   . Unspecified urinary incontinence   . Cellulitis   . Mastoiditis    Past Surgical History  Procedure Date  . Knee surgery 2003    meniscus  . Eye surgery     Left retina repair- 11/09, 02-20-09   History  Substance Use Topics  . Smoking status: Never  Smoker   . Smokeless tobacco: Not on file  . Alcohol Use: No   No family history on file. Allergies  Allergen Reactions  . Codeine     REACTION: UNSPECIFIED   Current Outpatient Prescriptions on File Prior to Visit  Medication Sig Dispense Refill  . albuterol (PROVENTIL,VENTOLIN) 90 MCG/ACT inhaler Inhale 2 puffs into the lungs every 6 (six) hours as needed. Inhale 2 puffs using inhaler once a day as needed.       Marland Kitchen aspirin (ADULT ASPIRIN EC LOW STRENGTH) 81 MG EC tablet Take 81 mg by mouth daily. Take one tablet by mouth every other day.       . busPIRone (BUSPAR) 10 MG tablet TAKE TWO TABLETS BY MOUTH AT BEDTIME  60 tablet  3  . FLUoxetine (PROZAC) 20 MG capsule TAKE THREE CAPSULES BY MOUTH EVERY DAY  90 capsule  3  . furosemide (LASIX) 20 MG tablet TAKE ONE TABLET BY MOUTH IN THE MORNING  30 tablet  6  . gabapentin (NEURONTIN) 300 MG capsule Take 1 capsule (300 mg total) by mouth 3 (three) times daily.  90 capsule  11  . glucose blood test strip 1 each by Other route as needed. Test sugars four times daily       . glyBURIDE-metformin (GLUCOVANCE) 5-500 MG per tablet Take 1 tablet by mouth daily with breakfast. 1 tab  twice a day       . insulin glargine (LANTUS) 100 UNIT/ML injection Inject into the skin at bedtime. 20 units qhs       . lisinopril (PRINIVIL,ZESTRIL) 10 MG tablet TAKE ONE TABLET BY MOUTH EVERY DAY AT BEDTIME  90 tablet  3  . lovastatin (MEVACOR) 20 MG tablet TAKE ONE TABLET BY MOUTH EVERY DAY  30 tablet  3  . Lutein 20 MG CAPS Take by mouth as directed.        . naproxen sodium (ALEVE) 220 MG tablet Take 220 mg by mouth 2 (two) times daily with a meal. OTC one tablet twice a day       . ranitidine (ZANTAC) 150 MG capsule Take 1 capsule (150 mg total) by mouth 2 (two) times daily.  90 capsule  3  . ranitidine (ZANTAC) 150 MG tablet TAKE ONE TABLET BY MOUTH TWICE DAILY  30 tablet  0   The PMH, PSH, Social History, Family History, Medications, and allergies have been  reviewed in [facility], and have been updated if relevant.   Review of Systems See HPI    Objective:   Physical Exam BP 132/82  Pulse 73  Temp(Src) 98.4 F (36.9 C) (Oral)  Wt 243 lb 8 oz (110.451 kg) Gen:  Alert, pleasant, NAD Skin:  Left thigh: large (5 cm) circular, raised lesion with central clearing on back of left thigh, similar lesion on front of left thigh, a little smaller. No other lesions. Psych:  Not anxious or depressed appearing.         Assessment & Plan:   1. Tinea    New.  Does seem consistent with tinea.  Will treat with antifungal/steroid cream. If no improvement in 2 weeks, will biopsy area. The patient indicates understanding of these issues and agrees with the plan.

## 2011-05-01 ENCOUNTER — Encounter (INDEPENDENT_AMBULATORY_CARE_PROVIDER_SITE_OTHER): Payer: Self-pay | Admitting: General Surgery

## 2011-05-30 ENCOUNTER — Other Ambulatory Visit: Payer: Self-pay | Admitting: Family Medicine

## 2011-06-05 ENCOUNTER — Other Ambulatory Visit: Payer: Self-pay | Admitting: Family Medicine

## 2011-06-10 LAB — BASIC METABOLIC PANEL
CO2: 19
Calcium: 9
Calcium: 9
Calcium: 9.2
Chloride: 100
Creatinine, Ser: 0.79
GFR calc Af Amer: >60
GFR calc Af Amer: >60
GFR calc non Af Amer: >60
GFR calc non Af Amer: >60
Glucose, Bld: 144 — ABNORMAL HIGH
Glucose, Bld: 339 — ABNORMAL HIGH
Sodium: 135
Sodium: 137

## 2011-06-10 LAB — HEMOGLOBIN A1C: Mean Plasma Glucose: 122

## 2011-06-10 LAB — CARDIAC PANEL(CRET KIN+CKTOT+MB+TROPI)
CK, MB: 2.4
Total CK: 74

## 2011-06-10 LAB — DIFFERENTIAL
Basophils Absolute: 0
Basophils Relative: 1
Monocytes Absolute: 0.5
Neutro Abs: 3.3
Neutrophils Relative %: 67

## 2011-06-10 LAB — CK TOTAL AND CKMB (NOT AT ARMC)
CK, MB: 2.1
Relative Index: INVALID
Total CK: 54
Total CK: 79

## 2011-06-10 LAB — CBC
Hemoglobin: 11 — ABNORMAL LOW
RBC: 3.94
RDW: 15.1
WBC: 9.6

## 2011-06-11 LAB — CBC
HCT: 34.4 — ABNORMAL LOW
Hemoglobin: 11.6 — ABNORMAL LOW
MCV: 83.3
RBC: 4.12
WBC: 6.7

## 2011-06-11 LAB — COMPREHENSIVE METABOLIC PANEL
ALT: 31
AST: 28
Albumin: 3.9
CO2: 23
Calcium: 8.9
Chloride: 102
GFR calc Af Amer: >60
GFR calc non Af Amer: 53 — ABNORMAL LOW
Sodium: 135
Total Bilirubin: 0.3

## 2011-06-14 ENCOUNTER — Encounter (INDEPENDENT_AMBULATORY_CARE_PROVIDER_SITE_OTHER): Payer: Self-pay | Admitting: General Surgery

## 2011-06-18 LAB — BASIC METABOLIC PANEL
CO2: 28
Chloride: 102
Creatinine, Ser: 0.7
GFR calc Af Amer: >60
Potassium: 4.3

## 2011-06-18 LAB — GLUCOSE, CAPILLARY
Glucose-Capillary: 128 — ABNORMAL HIGH
Glucose-Capillary: 223 — ABNORMAL HIGH
Glucose-Capillary: 351 — ABNORMAL HIGH
Glucose-Capillary: 99

## 2011-06-18 LAB — CBC
HCT: 32.3 — ABNORMAL LOW
MCHC: 32.9
MCV: 81.4
RBC: 3.97

## 2011-06-28 ENCOUNTER — Ambulatory Visit (INDEPENDENT_AMBULATORY_CARE_PROVIDER_SITE_OTHER): Payer: BC Managed Care – PPO | Admitting: General Surgery

## 2011-06-28 DIAGNOSIS — Z4651 Encounter for fitting and adjustment of gastric lap band: Secondary | ICD-10-CM

## 2011-06-28 NOTE — Patient Instructions (Signed)
Protein shakes only and no solid food over the weekend and then cautiously began here solid diet on Monday. Try to resume exercise when able. The careful to eliminate soft or liquid sweets.

## 2011-06-28 NOTE — Progress Notes (Signed)
Patient returns for followup for APL LAP-BAND placement with surgery dated August 21, 2010. At her last visit in June she had very appropriate restriction and weight loss. Since that time she has experienced some easing of her restriction where she is eating larger amounts of food. She still has difficulty swallowing bread or ground meat. She has had eye surgery which is greatly limited her exercise. She also notices some liquid and soft sweets creeping and her diet.  On examination she appears well. Abdomen is soft nontender. Port site looks fine.  With this information we went ahead and did a 1/2 cc filled to bring her up to 7 cc. We discussed that she's going to need to exercise test success and hopefully after I heals she converted back into this. We also discussed the importance of avoiding liquid calories. I'll see her back in 6 weeks.

## 2011-06-30 ENCOUNTER — Other Ambulatory Visit: Payer: Self-pay | Admitting: Family Medicine

## 2011-07-01 ENCOUNTER — Other Ambulatory Visit: Payer: Self-pay | Admitting: Family Medicine

## 2011-07-13 ENCOUNTER — Other Ambulatory Visit: Payer: Self-pay | Admitting: Family Medicine

## 2011-07-22 ENCOUNTER — Other Ambulatory Visit: Payer: Self-pay | Admitting: Family Medicine

## 2011-08-22 ENCOUNTER — Encounter (INDEPENDENT_AMBULATORY_CARE_PROVIDER_SITE_OTHER): Payer: BC Managed Care – PPO | Admitting: General Surgery

## 2011-09-01 ENCOUNTER — Other Ambulatory Visit: Payer: Self-pay | Admitting: Family Medicine

## 2011-09-02 ENCOUNTER — Other Ambulatory Visit: Payer: Self-pay | Admitting: Family Medicine

## 2011-09-20 ENCOUNTER — Other Ambulatory Visit: Payer: Self-pay | Admitting: Internal Medicine

## 2011-09-20 MED ORDER — ALBUTEROL 90 MCG/ACT IN AERS: 2.0000 | INHALATION_SPRAY | Freq: Four times a day (QID) | RESPIRATORY_TRACT | Status: DC | PRN

## 2011-09-20 NOTE — Telephone Encounter (Signed)
Rx faxed to pharmacy  

## 2011-09-23 ENCOUNTER — Telehealth: Payer: Self-pay | Admitting: *Deleted

## 2011-09-23 NOTE — Telephone Encounter (Signed)
Patient advised via telephone, we do not have any Lantus samples at this time.

## 2011-09-23 NOTE — Telephone Encounter (Signed)
Patient called stating that she is out of her Lantus and will not get it until sometime Thursday. Patient wants to know if you have any samples that she can have because she does not think that you want her to go without this until Thursday. Please let patient known.

## 2011-09-23 NOTE — Telephone Encounter (Signed)
[name] please check to see if we have lantus samples.  If so, she can definitely have some.

## 2011-09-25 ENCOUNTER — Other Ambulatory Visit: Payer: Self-pay | Admitting: Internal Medicine

## 2011-09-25 MED ORDER — INSULIN GLARGINE 100 UNIT/ML ~~LOC~~ SOLN: 20.0000 [IU] | Freq: Every day | SUBCUTANEOUS | Status: DC

## 2011-09-25 NOTE — Telephone Encounter (Signed)
Sent Rx to pharmacy for lantus

## 2011-10-02 ENCOUNTER — Ambulatory Visit (INDEPENDENT_AMBULATORY_CARE_PROVIDER_SITE_OTHER): Payer: BC Managed Care – PPO | Admitting: General Surgery

## 2011-10-02 ENCOUNTER — Encounter (INDEPENDENT_AMBULATORY_CARE_PROVIDER_SITE_OTHER): Payer: Self-pay | Admitting: General Surgery

## 2011-10-02 DIAGNOSIS — Z4651 Encounter for fitting and adjustment of gastric lap band: Secondary | ICD-10-CM

## 2011-10-02 DIAGNOSIS — E669 Obesity, unspecified: Secondary | ICD-10-CM

## 2011-10-02 NOTE — Patient Instructions (Signed)
Liquid diet only today then cautiously start solid foods. Concentrate on small bites and eating very slowly. Continue to use your exercise bike.

## 2011-10-02 NOTE — Progress Notes (Signed)
History: Patient returns for followup of LAP-BAND placed December 2011. We did a fill in October. She did notice some increase in restriction. She however when she eats appropriately is eating over a cup of food. The only time she gets nausea vomiting is when she takes today to bite or eats too fast. Her weight is down 3 pounds from her last visit. She had been having difficulty exercising due to her eye problems but they now have a stationary bike in the house which is beginning to use. She is also interested in restoring the support group I encouraged this  Exam: BP 162/86  Pulse 92  Temp(Src) 96.6 F (35.9 C) (Temporal)  Resp 24  Ht 5\' 7"  (1.702 m)  Wt 241 lb (109.317 kg)  BMI 37.75 kg/m2  General: Obese in no distress Abdomen: Soft and nontender port site normal  With this information we went ahead and added an additional 1/2 cc to bring her up to 7 cc. We discussed diet exercise strategies. We have her weight going back to right direction. Total weight loss now is 28 pounds. She will return in 6 weeks.

## 2011-10-29 ENCOUNTER — Other Ambulatory Visit: Payer: Self-pay | Admitting: Family Medicine

## 2011-11-04 ENCOUNTER — Other Ambulatory Visit: Payer: Self-pay | Admitting: Family Medicine

## 2011-11-14 ENCOUNTER — Encounter (INDEPENDENT_AMBULATORY_CARE_PROVIDER_SITE_OTHER): Payer: BC Managed Care – PPO | Admitting: General Surgery

## 2011-12-08 ENCOUNTER — Other Ambulatory Visit: Payer: Self-pay | Admitting: Family Medicine

## 2011-12-27 ENCOUNTER — Encounter (INDEPENDENT_AMBULATORY_CARE_PROVIDER_SITE_OTHER): Payer: Self-pay | Admitting: General Surgery

## 2011-12-27 ENCOUNTER — Ambulatory Visit (INDEPENDENT_AMBULATORY_CARE_PROVIDER_SITE_OTHER): Payer: BC Managed Care – PPO | Admitting: General Surgery

## 2011-12-27 VITALS — BP 140/80 | HR 72 | Temp 97.8°F | Resp 18 | Ht 67.0 in | Wt 250.0 lb

## 2011-12-27 DIAGNOSIS — E669 Obesity, unspecified: Secondary | ICD-10-CM

## 2011-12-27 NOTE — Progress Notes (Signed)
Chief complaint: Followup lap band  History: Patient returns for followup of lap band placed December 2011. She has had some difficulty with weight loss. Her followup has been a little spotty. She was last seen in January we did a half cc fill. Since then she has however gained 9 pounds. Total weight loss now is 19 pounds from preop of 269 to current 250. She says her husband has been ill and he takes her to her she can walk and therefore she has not been able to exercise. She is also not gone the support group because of his illness. We reviewed her diet. She is eating sometimes up to 2 or 3 cups of food. She rarely has something stick but is usually bread or swallowing too quickly.  Exam: BP 140/80  Pulse 72  Temp(Src) 97.8 F (36.6 C) (Temporal)  Resp 18  Ht 5\' 7"  (1.702 m)  Wt 250 lb (113.399 kg)  BMI 39.16 kg/m2 Total weight loss 19 pounds Gen.: Morbidly obese but otherwise well-appearing Abdomen: Soft and nontender. Port site looks fine.  With this information we were had with an additional half cc fill today to bring her up to 7.5 cc . She was able to tolerate water well. We reviewed diet and exercise strategies. We discussed that is going to be difficult to have significant weight loss without doing any exercise. She will try to begin walking. Return in 2 months.

## 2012-03-12 ENCOUNTER — Encounter (INDEPENDENT_AMBULATORY_CARE_PROVIDER_SITE_OTHER): Payer: BC Managed Care – PPO | Admitting: General Surgery

## 2012-03-12 ENCOUNTER — Other Ambulatory Visit: Payer: Self-pay | Admitting: Family Medicine

## 2012-03-20 ENCOUNTER — Other Ambulatory Visit: Payer: Self-pay | Admitting: Family Medicine

## 2012-05-07 ENCOUNTER — Ambulatory Visit (INDEPENDENT_AMBULATORY_CARE_PROVIDER_SITE_OTHER): Payer: BC Managed Care – PPO | Admitting: General Surgery

## 2012-05-07 ENCOUNTER — Encounter (INDEPENDENT_AMBULATORY_CARE_PROVIDER_SITE_OTHER): Payer: Self-pay | Admitting: General Surgery

## 2012-05-07 ENCOUNTER — Other Ambulatory Visit (INDEPENDENT_AMBULATORY_CARE_PROVIDER_SITE_OTHER): Payer: Self-pay

## 2012-05-07 VITALS — BP 128/84 | HR 68 | Temp 97.6°F | Resp 18 | Ht 67.0 in | Wt 246.4 lb

## 2012-05-07 DIAGNOSIS — Z4651 Encounter for fitting and adjustment of gastric lap band: Secondary | ICD-10-CM

## 2012-05-07 DIAGNOSIS — E669 Obesity, unspecified: Secondary | ICD-10-CM

## 2012-05-07 DIAGNOSIS — Z008 Encounter for other general examination: Secondary | ICD-10-CM

## 2012-05-07 DIAGNOSIS — Z9884 Bariatric surgery status: Secondary | ICD-10-CM

## 2012-05-07 NOTE — Progress Notes (Signed)
Chief complaint: Obesity and followup lap band  History: Patient returns for followup flat band placed in December of 2011. She has comorbidities of diabetes and hyperlipidemia and hypertension.she has had difficulty exercising due to retinal detachment and poor vision. She was last here in April and we did a fill. She states she did experience some increase in restriction. This however has gradually inflated. [facility] has joined [facility] [employer] and plans to begin an exercise program. She is not having any vomiting or symptoms or restriction.  Exam: BP 128/84  Pulse 68  Temp 97.6 F (36.4 C) (Temporal)  Resp 18  Ht 5\' 7"  (1.702 m)  Wt 246 lb 6.4 oz (111.766 kg)  BMI 38.59 kg/m2 Total weight loss 23 pounds, 4 pounds since last visit General: Obese and somewhat chronically ill-appearing Abdomen: Soft and nontender. Incisions and port site looks fine.  With this information we elected to go ahead with a fill-in and 0.5 cc today to bring her up to 8 cc. We're going to have her see the dietitian for refresher course. We discussed at least her weight is heading the right direction currently and with an additional fill-in having some exercise we should be able to keep this going. I asked her to return in 2 months.

## 2012-05-07 NOTE — Patient Instructions (Signed)
Try to exercise 3 times per week. We will ask the dietitian to call you for an appointment. Concentrate on small bites and eating slowly.

## 2012-05-08 NOTE — Progress Notes (Signed)
Referral to Nuritionist for Lap Band Refresher.

## 2012-05-11 ENCOUNTER — Ambulatory Visit: Payer: BC Managed Care – PPO | Admitting: *Deleted

## 2012-05-13 ENCOUNTER — Encounter: Payer: Self-pay | Admitting: *Deleted

## 2012-05-13 ENCOUNTER — Encounter: Payer: BC Managed Care – PPO | Attending: General Surgery | Admitting: *Deleted

## 2012-05-13 VITALS — Ht 67.0 in | Wt 247.5 lb

## 2012-05-13 DIAGNOSIS — Z713 Dietary counseling and surveillance: Secondary | ICD-10-CM | POA: Insufficient documentation

## 2012-05-13 DIAGNOSIS — E669 Obesity, unspecified: Secondary | ICD-10-CM

## 2012-05-13 DIAGNOSIS — Z9884 Bariatric surgery status: Secondary | ICD-10-CM | POA: Insufficient documentation

## 2012-05-13 DIAGNOSIS — Z09 Encounter for follow-up examination after completed treatment for conditions other than malignant neoplasm: Secondary | ICD-10-CM | POA: Insufficient documentation

## 2012-05-13 NOTE — Patient Instructions (Addendum)
Goals:  Follow Bariatric Surgery Specialized Post-Op Diet (see handout)  Have 15 g carbs per meal (avoid fruit/starches for now)  Limit diet sodas  Eat 3-6 small meals/snacks, every 3-5 hrs  Increase lean protein foods to meet 60-80g goal  Increase fluid intake to 64oz +  Avoid drinking 15 minutes before, during and 30 minutes after eating  Aim for >30 min of physical activity as able  Resume multivitamin and calcium supplementation

## 2012-05-13 NOTE — Progress Notes (Signed)
  Follow-up visit:  20.5 Months Post-Operative LAGB Surgery  Medical Nutrition Therapy:  Appt start time: 1530  End time:  1700.  Primary concerns today:  Post-operative Bariatric Surgery Nutrition Management; LAGB Refresher. [name] returns today for [employer]. Dietary recall reveals extremely high CHO, especially watermelon, and fast food intake. She states she did not know that watermelon was a carb and that it digested as fast as it does. 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 Also dealing with highly emotional state at home with a very negative husband.    Surgery date: 08/19/10 Start weight at [facility]: N/A Weight goal: <200 lbs  Weight today:  247.2 lbs   BMI: 38.8 kg/m^2  TANITA  BODY COMP RESULTS  05/13/12   %Fat 48.1%   Fat Mass (lbs) 119.0   Fat Free Mass (lbs) 128.5   Total Body Water (lbs) 94.0   24-hr recall: B (AM): Protein shake (30-40g) Snk (AM): None  L (PM): [employer] quarter pounder w/ 1/2 of bun OR salad w/ cottage cheese, bacon bits, ham (no lettuce) Snk (PM): 2-3 cups watermelon D (PM): Ham & cheese OR pintos and green beans Snk (PM): 1/2 can fruit Beverages: water, tea w/ splenda, diet Dr. Reino Kent  Fluid intake: >64 oz Estimated total protein intake: 70-80g  Medications: See medication list. Reconciled with patient. Supplementation: Not taking at this time  CBG monitoring: Not reported this visit d/t time constraints Average CBG per patient: same Last patient reported A1c: same Lab Results  Component Value Date   HGBA1C 6.9* 10/29/2010   Using straws: No Drinking while eating: Yes; sips to help food go down Hair loss: Yes Carbonated beverages: Yes; Pt states Dr. Johna Sheriff ok'd N/V/D/C: None reported, though pt states she cannot tolerate bread or lettuce Last Lap-Band fill: 05/07/12 - Pt feels in the green zone  Recent physical activity:  None per MD order  d/t detatched retina  Progress Towards Goal(s):  In progress.  Handouts given during visit include:  Specialized Bariatric Surgery Post-Op Diet  Bariatric Surgery Protein Shakes     Nutritional Diagnosis:  NB-1.1 Food and nutrition-related knowledge deficit As related to post-op LAGB nutrition guidelines.  As evidenced by patient consuming excessive portions of CHO and fast food.    Intervention:  Nutrition education/reinforcement.  Monitoring/Evaluation:  Dietary intake, exercise, lap band fills, and body weight. Follow up in 5 weeks.

## 2012-05-15 ENCOUNTER — Other Ambulatory Visit: Payer: Self-pay | Admitting: Family Medicine

## 2012-05-15 NOTE — Telephone Encounter (Signed)
Electronic refill request

## 2012-06-06 ENCOUNTER — Other Ambulatory Visit: Payer: Self-pay | Admitting: Family Medicine

## 2012-06-08 NOTE — Telephone Encounter (Signed)
Pt hasn't been seen by you in over a year and has no upcoming appts scheduled.  OK to refill?

## 2012-06-08 NOTE — Telephone Encounter (Signed)
Ok to refill one month supply.  Needs follow up for further refills.

## 2012-06-17 ENCOUNTER — Encounter: Payer: Medicare Other | Attending: General Surgery | Admitting: *Deleted

## 2012-06-17 ENCOUNTER — Encounter: Payer: Self-pay | Admitting: *Deleted

## 2012-06-17 VITALS — Ht 67.0 in | Wt 249.0 lb

## 2012-06-17 DIAGNOSIS — Z9884 Bariatric surgery status: Secondary | ICD-10-CM | POA: Insufficient documentation

## 2012-06-17 DIAGNOSIS — Z09 Encounter for follow-up examination after completed treatment for conditions other than malignant neoplasm: Secondary | ICD-10-CM | POA: Insufficient documentation

## 2012-06-17 DIAGNOSIS — Z713 Dietary counseling and surveillance: Secondary | ICD-10-CM | POA: Insufficient documentation

## 2012-06-17 DIAGNOSIS — E669 Obesity, unspecified: Secondary | ICD-10-CM

## 2012-06-17 NOTE — Progress Notes (Signed)
  Follow-up visit:  22 Months Post-Operative LAGB Surgery  Medical Nutrition Therapy:  Appt start time:  915  End time:  1000.  Primary concerns today:  Post-operative Bariatric Surgery Nutrition Management; LAGB Refresher. Clary returns today for f/u with small wt gain. Has decreased watermelon and other carbs, though continues to eat out daily and thinks she is eating out of boredom.  No consistent exercise noted. States "I know I can do this, I just need to do it".    Discussed emotional eating, carb counting, and exercise.   Surgery date: 08/19/10 Start weight at [facility]: N/A Weight goal: <200 lbs  Weight today: 249.0 lbs Weight change: 1.8 lb GAIN   BMI:  39.0 kg/m^2  TANITA  BODY COMP RESULTS  05/13/12 06/17/12   %Fat 48.1% 48.3%   Fat Mass (lbs) 119.0 120.5   Fat Free Mass (lbs) 128.5 128.5   Total Body Water (lbs) 94.0 94.0   24-hr recall: B (AM): Fried egg, toast, bacon (didn't work) Snk (AM): None  L (PM): McD[employer] quarter pounder w/ 1/2 of bun OR salad w/ cottage cheese, bacon bits, ham (no lettuce) Snk (PM): ?? D (PM): Ribs with NAS bbq sauce Snk (PM): 1/2 can fruit Beverages: decreased water, diet Dr. Reino Kent  Fluid intake: >64 oz Estimated total protein intake: 70-80g  Medications: See medication list. Reconciled with patient. Supplementation: Not taking at this time  CBG monitoring: daily Average CBG per patient: 98-160 mg/dL  Lab Results  Component Value Date   HGBA1C 6.9* 10/29/2010    Using straws: No Drinking while eating: Yes; sips to help food go down Hair loss: Yes Carbonated beverages: Yes; Pt states Dr. Johna Sheriff ok'd N/V/D/C: None reported, though pt states she cannot tolerate bread or lettuce Last Lap-Band fill: 05/07/12 - Pt feels in the green zone  Recent physical activity:  None per MD order d/t detatched retina  Progress Towards Goal(s):  In progress.  Nutritional Diagnosis:  NB-1.1 Food and nutrition-related knowledge deficit As related  to post-op LAGB nutrition guidelines.  As evidenced by patient consuming excessive portions of CHO and fast food.    Intervention:  Nutrition education/reinforcement.  Monitoring/Evaluation:  Dietary intake, exercise, lap band fills, and body weight. Follow up in 4 weeks.

## 2012-06-17 NOTE — Patient Instructions (Addendum)
Goals:  Follow Bariatric Surgery Specialized Post-Op Diet (see handout)  Have 15 g carbs per meal (avoid fruit/starches for now)  Limit diet sodas  Increase lean protein foods to meet 60-80g goal  Increase fluid intake to 64oz +  Aim for >30 min of physical activity as able  Resume multivitamin and calcium supplementation  You can get them at [facility] (Try Citracal Calcium Citrate Petites, Chewable Centrum multivitamin)

## 2012-06-28 ENCOUNTER — Other Ambulatory Visit: Payer: Self-pay | Admitting: Family Medicine

## 2012-06-29 NOTE — Telephone Encounter (Signed)
Pt needs office visit

## 2012-07-10 ENCOUNTER — Encounter (INDEPENDENT_AMBULATORY_CARE_PROVIDER_SITE_OTHER): Payer: BC Managed Care – PPO | Admitting: General Surgery

## 2012-07-15 ENCOUNTER — Ambulatory Visit: Payer: BC Managed Care – PPO | Admitting: *Deleted

## 2012-07-20 ENCOUNTER — Other Ambulatory Visit: Payer: Self-pay | Admitting: Family Medicine

## 2012-07-21 NOTE — Telephone Encounter (Signed)
Pt has not had A1C since 2/12, hasnt been seen by you since 8/12, (for a rash), and has no upcoming appts scheduled.  Please advise.

## 2012-07-21 NOTE — Telephone Encounter (Signed)
Ok to refill one month only.  Needs to be seen for further refills. 

## 2012-08-01 ENCOUNTER — Other Ambulatory Visit: Payer: Self-pay | Admitting: Family Medicine

## 2012-08-03 NOTE — Telephone Encounter (Signed)
Ok to refill once.  No further refills unless she makes appointment.

## 2012-08-03 NOTE — Telephone Encounter (Signed)
Instructions on lisinopril called to [facility], one by mouth daily.

## 2012-08-03 NOTE — Addendum Note (Signed)
Addended by: Eliezer Bottom on: 08/03/2012 04:47 PM   Modules accepted: Orders

## 2012-08-03 NOTE — Telephone Encounter (Signed)
OK to refill?  Pt hasnt been seen in over a year.  Was told last time she needed an office visit.

## 2012-08-05 NOTE — Telephone Encounter (Signed)
Crystal with [facility] [address] request instructions for Lisinopril 10 mg; advised pt to take one tab by mouth daily.

## 2012-08-18 ENCOUNTER — Other Ambulatory Visit: Payer: Self-pay | Admitting: Family Medicine

## 2012-08-18 NOTE — Telephone Encounter (Signed)
Pt is requesting refill on glyburide, pt has not been seen in this office since 04/2011. Was told last month that she needed appt.  Please advise.

## 2012-08-18 NOTE — Telephone Encounter (Signed)
Medicine sent to pharmacy.  Appt made with patient for 08/31/12.

## 2012-08-18 NOTE — Telephone Encounter (Signed)
Needs to be seen but I cannot leave her without diabetes medication.  Only one more refill and then we may have to discharge her from practice if she cannot keep follow up appointments unfortunately.

## 2012-08-27 ENCOUNTER — Ambulatory Visit (INDEPENDENT_AMBULATORY_CARE_PROVIDER_SITE_OTHER): Payer: Medicare Other | Admitting: Family Medicine

## 2012-08-27 ENCOUNTER — Encounter: Payer: Self-pay | Admitting: Family Medicine

## 2012-08-27 ENCOUNTER — Other Ambulatory Visit: Payer: Self-pay | Admitting: *Deleted

## 2012-08-27 VITALS — BP 118/64 | HR 68 | Temp 98.0°F | Wt 239.0 lb

## 2012-08-27 DIAGNOSIS — I10 Essential (primary) hypertension: Secondary | ICD-10-CM

## 2012-08-27 DIAGNOSIS — E785 Hyperlipidemia, unspecified: Secondary | ICD-10-CM

## 2012-08-27 DIAGNOSIS — E1159 Type 2 diabetes mellitus with other circulatory complications: Secondary | ICD-10-CM

## 2012-08-27 DIAGNOSIS — Z23 Encounter for immunization: Secondary | ICD-10-CM

## 2012-08-27 LAB — COMPREHENSIVE METABOLIC PANEL
AST: 19 U/L (ref 0–37)
Alkaline Phosphatase: 57 U/L (ref 39–117)
BUN: 15 mg/dL (ref 6–23)
Creatinine, Ser: 0.7 mg/dL (ref 0.4–1.2)
Potassium: 4 mEq/L (ref 3.5–5.1)

## 2012-08-27 LAB — LIPID PANEL
Cholesterol: 164 mg/dL (ref 0–200)
LDL Cholesterol: 101 mg/dL — ABNORMAL HIGH (ref 0–99)
Triglycerides: 130 mg/dL (ref 0.0–149.0)
VLDL: 26 mg/dL (ref 0.0–40.0)

## 2012-08-27 MED ORDER — GLUCOSE BLOOD VI STRP: ORAL_STRIP | Status: DC

## 2012-08-27 NOTE — Patient Instructions (Addendum)
Good to see you. Have a Merry Christmas. We will call you with your lab results.  Check with your insurance to see if they will cover the shingles shot.

## 2012-08-27 NOTE — Progress Notes (Signed)
61 yo here follow up/med refill.  Has not been seen in over a year for routine follow up.  Obesity-  Had lap band procedure on 08/21/10.   Has been doing better with her diet past few months- has lost 10 pounds.  Still not exercising regularly. Wt Readings from Last 3 Encounters:  08/27/12 239 lb (108.41 kg)  06/17/12 249 lb (112.946 kg)  05/13/12 247 lb 8 oz (112.265 kg)    .  DM- CBGs have been lower than have been in years.  Denies any episodes of hypoglycemia.  Lab Results  Component Value Date   HGBA1C 6.9* 10/29/2010   Checks CBGs daily- never above 130 fasting. Taking Lantus 28 units at bedtime, Glucvance 5-500 1 tab two times a day and Actos 45 mg daily.   HLD- On Mevacor 20 mg daily.  Denies myalgias. Lab Results  Component Value Date   CHOL 163 12/29/2009   HDL 44.00 12/29/2009   LDLCALC 88 12/29/2009   TRIG 156.0* 12/29/2009   CHOLHDL 4 12/29/2009    HTN-  Patient Active Problem List  Diagnosis  . DIABETES MELLITUS, TYPE II, CONTROLLED, W/VASCULAR COMPS  . HYPERLIPIDEMIA  . OBESITY, UNSPECIFIED  . ANEMIA  . ANXIETY  . DEPRESSION  . RECENT RETINAL DETACHMENT PARTIAL W/GIANT TEAR  . RETINAL DETACHMENT  . HYPERTENSION, BENIGN  . CHF  . DIASTOLIC HEART FAILURE, CHRONIC  . URI  . ASTHMA, WITH ACUTE EXACERBATION  . LUNG NODULE  . Contact Dermatitis and Other Eczema, due to Unspecified Cause  . WHEEZING  . DYSPNEA ON EXERTION  . POSTMENOPAUSAL STATUS  . Headache  . Tinea   Past Medical History  Diagnosis Date  . Chronic diastolic heart failure   . Congestive heart failure, unspecified   . Other dyspnea and respiratory abnormality   . Essential hypertension, benign   . Other and unspecified hyperlipidemia   . Type II or unspecified type diabetes mellitus with peripheral circulatory disorders, not stated as uncontrolled(250.70)   . Anxiety state, unspecified   . Depressive disorder, not elsewhere classified   . Unspecified asthma, with exacerbation   .  Recent retinal detachment, partial, with giant tear   . Anemia, unspecified   . Wheezing   . Acute upper respiratory infections of unspecified site   . Contact dermatitis and other eczema, due to unspecified cause   . Other diseases of lung, not elsewhere classified   . Unspecified urinary incontinence   . Cellulitis   . Mastoiditis   . Diabetes mellitus    Past Surgical History  Procedure Date  . Knee surgery 2003    meniscus  . Eye surgery     Left retina repair- 11/09, 02-20-09  . Laparoscopic gastric banding 08/21/10  . Abdominal hysterectomy   . Dilation and curettage of uterus    History  Substance Use Topics  . Smoking status: Never Smoker   . Smokeless tobacco: Never Used  . Alcohol Use: No   Family History  Problem Relation Age of Onset  . Cancer Mother     breast  . Cancer Sister     breast  . Cancer Maternal Aunt     breast  . Cancer Maternal Uncle     breast  . Cancer Maternal Grandmother     breast   Allergies  Allergen Reactions  . Codeine     REACTION: UNSPECIFIED   Current Outpatient Prescriptions on File Prior to Visit  Medication Sig Dispense Refill  . albuterol (PROVENTIL,VENTOLIN)  90 MCG/ACT inhaler Inhale 2 puffs into the lungs every 6 (six) hours as needed. Inhale 2 puffs using inhaler once a day as needed.  17 g  2  . busPIRone (BUSPAR) 10 MG tablet TAKE TWO TABLETS BY MOUTH AT BEDTIME  180 tablet  3  . FLUoxetine (PROZAC) 20 MG capsule TAKE THREE CAPSULES BY MOUTH EVERY DAY  90 capsule  6  . furosemide (LASIX) 20 MG tablet TAKE ONE TABLET BY MOUTH IN THE MORNING  30 tablet  12  . glyBURIDE-metformin (GLUCOVANCE) 5-500 MG per tablet TAKE ONE TABLET BY MOUTH TWICE DAILY  60 tablet  0  . insulin glargine (LANTUS) 100 UNIT/ML injection Inject 20 Units into the skin at bedtime.  10 mL  6  . lisinopril (PRINIVIL,ZESTRIL) 10 MG tablet Take 10 mg by mouth daily. MUST BE SEEN BEFORE FURTHER REFILLS.      Marland Kitchen lovastatin (MEVACOR) 20 MG tablet TAKE ONE  TABLET BY MOUTH EVERY DAY  90 tablet  11  . ONE TOUCH ULTRA TEST test strip USE TO TEST BLOOD SUGAR FOUR TIMES A DAY  120 each  0  . ranitidine (ZANTAC) 150 MG tablet TAKE ONE TABLET BY MOUTH TWICE DAILY  30 tablet  0     Review of Systems  See HPI  General: Denies malaise.  GI: Denies abdominal pain.   Physical Exam  BP 118/64  Pulse 68  Temp 98 F (36.7 C)  Wt 239 lb (108.41 kg)   General:  Well-developed,well-nourished,in no acute distress; alert,appropriate and cooperative throughout examination Head:  normocephalic and atraumatic.   Eyes:  vision grossly intact, pupils equal, pupils round, and pupils reactive to light.   Ears:  R ear normal and L ear normal.   Nose:  no external deformity.   Mouth:  good dentition.   Lungs:  Normal respiratory effort, chest expands symmetrically. Lungs are clear to auscultation, no crackles or wheezes. Heart:  Normal rate and regular rhythm. S1 and S2 normal without gallop, murmur, click, rub or other extra sounds. Abdomen:  Bowel sounds positive,abdomen soft and non-tender without masses, organomegaly or hernias noted. Msk:  No deformity or scoliosis noted of thoracic or lumbar spine.   Extremities:  No clubbing, cyanosis, edema, or deformity noted with normal full range of motion of all joints.   Neurologic:  alert & oriented X3 and gait normal.   Skin:  Intact without suspicious lesions or rashes Cervical Nodes:  No lymphadenopathy noted Axillary Nodes:  No palpable lymphadenopathy Psych:  Cognition and judgment appear intact. Alert and cooperative with normal attention span and concentration. No apparent delusions, illusions, hallucinations  Assessment and Plan: 1. DIABETES MELLITUS, TYPE II, CONTROLLED, W/VASCULAR COMPS  Controlled on current meds.  Recheck a1c today to confirm this. On ACEI. UTD optho exam. Pneumovax today. Hemoglobin A1c  2. HYPERLIPIDEMIA  Continue Mevacor. Recheck labs today. Lipid Panel  3. HYPERTENSION,  BENIGN  Stable on current meds.   Comprehensive metabolic panel

## 2012-08-27 NOTE — Addendum Note (Signed)
Addended by: Eliezer Bottom on: 08/27/2012 08:53 AM   Modules accepted: Orders

## 2012-08-27 NOTE — Telephone Encounter (Signed)
Pharmacy request new rx sent with dx code on it.

## 2012-08-30 ENCOUNTER — Other Ambulatory Visit: Payer: Self-pay | Admitting: Family Medicine

## 2012-09-11 ENCOUNTER — Encounter (INDEPENDENT_AMBULATORY_CARE_PROVIDER_SITE_OTHER): Payer: BC Managed Care – PPO | Admitting: General Surgery

## 2012-09-21 ENCOUNTER — Other Ambulatory Visit: Payer: Self-pay | Admitting: Family Medicine

## 2012-10-03 ENCOUNTER — Other Ambulatory Visit: Payer: Self-pay | Admitting: Family Medicine

## 2012-10-21 ENCOUNTER — Other Ambulatory Visit: Payer: Self-pay | Admitting: Family Medicine

## 2012-11-26 ENCOUNTER — Other Ambulatory Visit (INDEPENDENT_AMBULATORY_CARE_PROVIDER_SITE_OTHER): Payer: Medicare Other

## 2012-11-26 DIAGNOSIS — E1059 Type 1 diabetes mellitus with other circulatory complications: Secondary | ICD-10-CM

## 2012-12-04 ENCOUNTER — Emergency Department (HOSPITAL_COMMUNITY): Payer: Medicare Other

## 2012-12-04 ENCOUNTER — Encounter (HOSPITAL_COMMUNITY): Payer: Self-pay

## 2012-12-04 ENCOUNTER — Other Ambulatory Visit: Payer: Self-pay

## 2012-12-04 ENCOUNTER — Emergency Department (HOSPITAL_COMMUNITY)
Admission: EM | Admit: 2012-12-04 | Discharge: 2012-12-05 | Disposition: A | Discharge status: Home / Self Care | Payer: Medicare Other | Attending: Emergency Medicine | Admitting: Emergency Medicine

## 2012-12-04 DIAGNOSIS — E1159 Type 2 diabetes mellitus with other circulatory complications: Secondary | ICD-10-CM | POA: Insufficient documentation

## 2012-12-04 DIAGNOSIS — Z872 Personal history of diseases of the skin and subcutaneous tissue: Secondary | ICD-10-CM | POA: Insufficient documentation

## 2012-12-04 DIAGNOSIS — R079 Chest pain, unspecified: Secondary | ICD-10-CM

## 2012-12-04 DIAGNOSIS — E119 Type 2 diabetes mellitus without complications: Secondary | ICD-10-CM | POA: Insufficient documentation

## 2012-12-04 DIAGNOSIS — R42 Dizziness and giddiness: Secondary | ICD-10-CM | POA: Insufficient documentation

## 2012-12-04 DIAGNOSIS — R5381 Other malaise: Secondary | ICD-10-CM | POA: Insufficient documentation

## 2012-12-04 DIAGNOSIS — Z8709 Personal history of other diseases of the respiratory system: Secondary | ICD-10-CM | POA: Insufficient documentation

## 2012-12-04 DIAGNOSIS — F411 Generalized anxiety disorder: Secondary | ICD-10-CM | POA: Insufficient documentation

## 2012-12-04 DIAGNOSIS — R072 Precordial pain: Secondary | ICD-10-CM | POA: Insufficient documentation

## 2012-12-04 DIAGNOSIS — Z794 Long term (current) use of insulin: Secondary | ICD-10-CM | POA: Insufficient documentation

## 2012-12-04 DIAGNOSIS — E785 Hyperlipidemia, unspecified: Secondary | ICD-10-CM | POA: Insufficient documentation

## 2012-12-04 DIAGNOSIS — R5383 Other fatigue: Secondary | ICD-10-CM | POA: Insufficient documentation

## 2012-12-04 DIAGNOSIS — R112 Nausea with vomiting, unspecified: Secondary | ICD-10-CM | POA: Insufficient documentation

## 2012-12-04 DIAGNOSIS — I509 Heart failure, unspecified: Secondary | ICD-10-CM | POA: Insufficient documentation

## 2012-12-04 DIAGNOSIS — F3289 Other specified depressive episodes: Secondary | ICD-10-CM | POA: Insufficient documentation

## 2012-12-04 DIAGNOSIS — J45909 Unspecified asthma, uncomplicated: Secondary | ICD-10-CM | POA: Insufficient documentation

## 2012-12-04 DIAGNOSIS — Z8669 Personal history of other diseases of the nervous system and sense organs: Secondary | ICD-10-CM | POA: Insufficient documentation

## 2012-12-04 DIAGNOSIS — I10 Essential (primary) hypertension: Secondary | ICD-10-CM | POA: Insufficient documentation

## 2012-12-04 DIAGNOSIS — Z87448 Personal history of other diseases of urinary system: Secondary | ICD-10-CM | POA: Insufficient documentation

## 2012-12-04 DIAGNOSIS — R61 Generalized hyperhidrosis: Secondary | ICD-10-CM | POA: Insufficient documentation

## 2012-12-04 DIAGNOSIS — I5032 Chronic diastolic (congestive) heart failure: Secondary | ICD-10-CM | POA: Insufficient documentation

## 2012-12-04 DIAGNOSIS — F329 Major depressive disorder, single episode, unspecified: Secondary | ICD-10-CM | POA: Insufficient documentation

## 2012-12-04 DIAGNOSIS — Z862 Personal history of diseases of the blood and blood-forming organs and certain disorders involving the immune mechanism: Secondary | ICD-10-CM | POA: Insufficient documentation

## 2012-12-04 DIAGNOSIS — Z79899 Other long term (current) drug therapy: Secondary | ICD-10-CM | POA: Insufficient documentation

## 2012-12-04 LAB — COMPREHENSIVE METABOLIC PANEL
Albumin: 3.6 g/dL (ref 3.5–5.2)
Alkaline Phosphatase: 61 U/L (ref 39–117)
BUN: 17 mg/dL (ref 6–23)
Potassium: 4.5 mEq/L (ref 3.5–5.1)
Total Protein: 6.7 g/dL (ref 6.0–8.3)

## 2012-12-04 LAB — TROPONIN I
Troponin I: <0.3 ng/mL (ref <0.30)
Troponin I: <0.3 ng/mL (ref <0.30)

## 2012-12-04 LAB — CBC WITH DIFFERENTIAL/PLATELET
Basophils Relative: 0 % (ref 0–1)
Eosinophils Absolute: 0.1 10*3/uL (ref 0.0–0.7)
MCH: 28 pg (ref 26.0–34.0)
MCHC: 34.1 g/dL (ref 30.0–36.0)
Monocytes Relative: 5 % (ref 3–12)
Neutrophils Relative %: 76 % (ref 43–77)
Platelets: 235 10*3/uL (ref 150–400)

## 2012-12-04 LAB — PRO B NATRIURETIC PEPTIDE: Pro B Natriuretic peptide (BNP): 27.7 pg/mL (ref 0–125)

## 2012-12-04 LAB — LIPASE, BLOOD: Lipase: 34 U/L (ref 11–59)

## 2012-12-04 MED ORDER — ONDANSETRON HCL 4 MG/2ML IJ SOLN
4.0000 mg | Freq: Once | INTRAMUSCULAR | Status: AC
  Administered 2012-12-04: 4 mg via INTRAVENOUS
  Filled 2012-12-04: qty 2

## 2012-12-04 MED ORDER — GI COCKTAIL ~~LOC~~: 30.0000 mL | Freq: Once | ORAL | Status: DC

## 2012-12-04 NOTE — ED Notes (Signed)
AIDET performed. 

## 2012-12-04 NOTE — ED Notes (Signed)
Patient presents with substernal chest discomfort, sudden onset after eating today; patient reports SOB and weakness all day today. Denies chest pain at this time. Patient allergic to ASA.

## 2012-12-04 NOTE — ED Provider Notes (Signed)
History     CSN: [id number]  Arrival date & time 12/04/12  5284   First MD Initiated Contact with Patient 12/04/12 1904      Chief Complaint  Patient presents with  . Chest Pain  . Dizziness    (Consider location/radiation/quality/duration/timing/severity/associated sxs/prior treatment) Patient is a 62 y.o. female presenting with chest pain.  Chest Pain Pain location:  Substernal area Pain quality: burning   Pain radiates to:  Does not radiate Pain severity:  Severe Onset quality:  Sudden Timing:  Constant Progression:  Improving Chronicity:  New Context: eating   Relieved by:  Nothing Associated symptoms: diaphoresis, dizziness, nausea, vomiting and weakness   Associated symptoms: no abdominal pain, no cough, no fever and no shortness of breath     Past Medical History  Diagnosis Date  . Chronic diastolic heart failure   . Congestive heart failure, unspecified   . Other dyspnea and respiratory abnormality   . Essential hypertension, benign   . Other and unspecified hyperlipidemia   . Type II or unspecified type diabetes mellitus with peripheral circulatory disorders, not stated as uncontrolled(250.70)   . Anxiety state, unspecified   . Depressive disorder, not elsewhere classified   . Unspecified asthma, with exacerbation   . Recent retinal detachment, partial, with giant tear   . Anemia, unspecified   . Wheezing   . Acute upper respiratory infections of unspecified site   . Contact dermatitis and other eczema, due to unspecified cause   . Other diseases of lung, not elsewhere classified   . Unspecified urinary incontinence   . Cellulitis   . Mastoiditis   . Diabetes mellitus     Past Surgical History  Procedure Laterality Date  . Knee surgery  2003    meniscus  . Eye surgery      Left retina repair- 11/09, 02-20-09  . Laparoscopic gastric banding  08/21/10  . Abdominal hysterectomy    . Dilation and curettage of uterus      Family History  Problem  Relation Age of Onset  . Cancer Mother     breast  . Cancer Sister     breast  . Cancer Maternal Aunt     breast  . Cancer Maternal Uncle     breast  . Cancer Maternal Grandmother     breast    History  Substance Use Topics  . Smoking status: Never Smoker   . Smokeless tobacco: Never Used  . Alcohol Use: No    OB History   Grav Para Term Preterm Abortions TAB SAB Ect Mult Living                  Review of Systems  Constitutional: Positive for diaphoresis. Negative for fever.  HENT: Negative for congestion.   Respiratory: Negative for cough and shortness of breath.   Cardiovascular: Positive for chest pain.  Gastrointestinal: Positive for nausea and vomiting. Negative for abdominal pain and diarrhea.  Genitourinary: Negative for difficulty urinating.  Neurological: Positive for dizziness and weakness.  All other systems reviewed and are negative.    Allergies  Aspirin and Codeine  Home Medications   Current Outpatient Rx  Name  Route  Sig  Dispense  Refill  . albuterol (PROVENTIL,VENTOLIN) 90 MCG/ACT inhaler   Inhalation   Inhale 2 puffs into the lungs every 6 (six) hours as needed. Inhale 2 puffs using inhaler once a day as needed.   17 g   2   . busPIRone (BUSPAR) 10 MG  tablet      TAKE TWO TABLETS BY MOUTH AT BEDTIME   180 tablet   1   . FLUoxetine (PROZAC) 20 MG capsule      TAKE THREE CAPSULES BY MOUTH EVERY DAY   90 capsule   6   . furosemide (LASIX) 20 MG tablet   Oral   Take 20 mg by mouth at bedtime.         Marland Kitchen glucose blood (ONE TOUCH ULTRA TEST) test strip      Check blood sugar four times a day. Dx 250.70   100 each   5   . glyBURIDE-metformin (GLUCOVANCE) 5-500 MG per tablet      TAKE ONE TABLET BY MOUTH TWICE DAILY   60 tablet   5   . insulin glargine (LANTUS) 100 UNIT/ML injection   Subcutaneous   Inject 25 Units into the skin at bedtime.         Marland Kitchen lisinopril (PRINIVIL,ZESTRIL) 10 MG tablet      TAKE ONE TABLET BY  MOUTH EVERY DAY   30 tablet   5   . lovastatin (MEVACOR) 20 MG tablet      TAKE ONE TABLET BY MOUTH EVERY DAY   90 tablet   11   . ranitidine (ZANTAC) 150 MG tablet      TAKE ONE TABLET BY MOUTH TWICE DAILY   30 tablet   0     BP 127/56  Pulse 79  Temp(Src) 98.2 F (36.8 C) (Oral)  Resp 17  SpO2 97%  Physical Exam  Nursing note and vitals reviewed. Constitutional: She is oriented to person, place, and time. She appears well-developed and well-nourished. No distress.  HENT:  Head: Normocephalic and atraumatic.  Mouth/Throat: Oropharynx is clear and moist.  Eyes: Conjunctivae are normal. Pupils are equal, round, and reactive to light. No scleral icterus.  Neck: Neck supple.  Cardiovascular: Normal rate, regular rhythm, normal heart sounds and intact distal pulses.   No murmur heard. Pulmonary/Chest: Effort normal and breath sounds normal. No stridor. No respiratory distress. She has no rales.  Abdominal: Soft. Bowel sounds are normal. She exhibits no distension. There is no tenderness. There is no rebound and no guarding.  Musculoskeletal: Normal range of motion. She exhibits no edema and no tenderness.  Neurological: She is alert and oriented to person, place, and time.  Skin: Skin is warm and dry. No rash noted.  Psychiatric: She has a normal mood and affect. Her behavior is normal.    ED Course  Procedures (including critical care time)  Labs Reviewed  CBC WITH DIFFERENTIAL - Abnormal; Notable for the following:    Hemoglobin 11.5 (*)    HCT 33.7 (*)    All other components within normal limits  COMPREHENSIVE METABOLIC PANEL - Abnormal; Notable for the following:    Glucose, Bld 129 (*)    GFR calc non Af Amer 57 (*)    GFR calc Af Amer 66 (*)    All other components within normal limits  TROPONIN I  LIPASE, BLOOD  PRO B NATRIURETIC PEPTIDE   Dg Chest 2 View  12/04/2012  *RADIOLOGY REPORT*  Clinical Data: Chest pain, dizziness  CHEST - 2 VIEW  Comparison:  05/23/2010  Findings: Cardiomediastinal silhouette is stable.  No acute infiltrate or pleural effusion.  No pulmonary edema.  Minimal degenerative changes lower thoracic spine.  IMPRESSION: No active disease.  No significant change.   Original Report Authenticated By: Natasha Mead, M.D.  Date: 12/05/2012  Rate: 75  Rhythm: normal sinus rhythm  QRS Axis: normal  Intervals: normal  ST/T Wave abnormalities: normal  Conduction Disutrbances:none  Narrative Interpretation:   Old EKG Reviewed: unchanged    1. Chest pain       MDM  62 yo female with chest pain. Sudden onset, after eating, associated with vomiting, diaphoresis, and lightheadedness.  She has a hx of Lap Band, and her symptoms are likely GI related.  However, given symptoms, obtained delta troponin which was negative.  EKG normal.  Discussed with general surgery about lap band.  They recommended obtaining GI series if there was concern about dysfunction.  Discussed with radiology who stated pt could get procedure in AM if needed.  Pt stated she felt it was working appropriately, was able to tolerate PO solids, and agreed to follow up with her surgeon's office.  She was chest pain free prior to discharge.  Of note, she did not want to take an aspirin because she gets "an asthma attack" when she takes it.          Rennis Petty, MD 12/05/12 [phone]

## 2012-12-04 NOTE — ED Notes (Signed)
Patient transported to X-ray 

## 2012-12-04 NOTE — ED Provider Notes (Signed)
I have supervised the resident on the management of this patient and agree with the note above. I personally interviewed and examined the patient and my addendum is below.   [name] is a 62 y.o. female hx of DM, HTN, HL, CHF here with chest pain. She finished dinner and then threw up. Then had dizziness and had chest pressure. Vitals stable. Cardiopulmonary exam unremarkable. Abdomen soft and nontender. Pain improved now. Will do delta troponin, call surgery regarding lap band, and reassess.   11:51 PM Delta troponin neg. Labs unremarkable, CXR nl. Surgery consulted and felt patient can get outpatient barium swallow eval. Patient will f/u with surgery outpatient. Stable for d/c.   Results for orders placed during the hospital encounter of 12/04/12  CBC WITH DIFFERENTIAL      Result Value Range   WBC 8.9  4.0 - 10.5 K/uL   RBC 4.10  3.87 - 5.11 MIL/uL   Hemoglobin 11.5 (*) 12.0 - 15.0 g/dL   HCT 04.5 (*) 40.9 - 81.1 %   MCV 82.2  78.0 - 100.0 fL   MCH 28.0  26.0 - 34.0 pg   MCHC 34.1  30.0 - 36.0 g/dL   RDW 91.4  78.2 - 95.6 %   Platelets 235  150 - 400 K/uL   Neutrophils Relative 76  43 - 77 %   Neutro Abs 6.7  1.7 - 7.7 K/uL   Lymphocytes Relative 18  12 - 46 %   Lymphs Abs 1.6  0.7 - 4.0 K/uL   Monocytes Relative 5  3 - 12 %   Monocytes Absolute 0.5  0.1 - 1.0 K/uL   Eosinophils Relative 2  0 - 5 %   Eosinophils Absolute 0.1  0.0 - 0.7 K/uL   Basophils Relative 0  0 - 1 %   Basophils Absolute 0.0  0.0 - 0.1 K/uL  COMPREHENSIVE METABOLIC PANEL      Result Value Range   Sodium 136  135 - 145 mEq/L   Potassium 4.5  3.5 - 5.1 mEq/L   Chloride 99  96 - 112 mEq/L   CO2 27  19 - 32 mEq/L   Glucose, Bld 129 (*) 70 - 99 mg/dL   BUN 17  6 - 23 mg/dL   Creatinine, Ser 2.13  0.50 - 1.10 mg/dL   Calcium 9.5  8.4 - 08.6 mg/dL   Total Protein 6.7  6.0 - 8.3 g/dL   Albumin 3.6  3.5 - 5.2 g/dL   AST 19  0 - 37 U/L   ALT 18  0 - 35 U/L   Alkaline Phosphatase 61  39 - 117 U/L   Total Bilirubin 0.3  0.3 - 1.2 mg/dL   GFR calc non Af Amer 57 (*) >90 mL/min   GFR calc Af Amer 66 (*) >90 mL/min  TROPONIN I      Result Value Range   Troponin I <0.30  <0.30 ng/mL  LIPASE, BLOOD      Result Value Range   Lipase 34  11 - 59 U/L  PRO B NATRIURETIC PEPTIDE      Result Value Range   Pro B Natriuretic peptide (BNP) 27.7  0 - 125 pg/mL  TROPONIN I      Result Value Range   Troponin I <0.30  <0.30 ng/mL   Dg Chest 2 View  12/04/2012  *RADIOLOGY REPORT*  Clinical Data: Chest pain, dizziness  CHEST - 2 VIEW  Comparison: 05/23/2010  Findings: Cardiomediastinal silhouette is stable.  No acute infiltrate or pleural effusion.  No pulmonary edema.  Minimal degenerative changes lower thoracic spine.  IMPRESSION: No active disease.  No significant change.   Original Report Authenticated By: Natasha Mead, M.D.       Richardean Canal, MD 12/04/12 [phone]

## 2012-12-25 ENCOUNTER — Other Ambulatory Visit: Payer: Self-pay | Admitting: Family Medicine

## 2013-03-08 ENCOUNTER — Other Ambulatory Visit: Payer: Self-pay | Admitting: *Deleted

## 2013-03-08 MED ORDER — LISINOPRIL 10 MG PO TABS: ORAL_TABLET | ORAL | Status: DC

## 2013-03-08 MED ORDER — BUSPIRONE HCL 10 MG PO TABS: ORAL_TABLET | ORAL | Status: DC

## 2013-03-25 ENCOUNTER — Other Ambulatory Visit: Payer: Self-pay

## 2013-04-05 ENCOUNTER — Other Ambulatory Visit: Payer: Self-pay | Admitting: *Deleted

## 2013-04-05 MED ORDER — LOVASTATIN 20 MG PO TABS: ORAL_TABLET | ORAL | Status: DC

## 2013-04-12 ENCOUNTER — Other Ambulatory Visit: Payer: Self-pay

## 2013-04-12 ENCOUNTER — Other Ambulatory Visit: Payer: Self-pay | Admitting: *Deleted

## 2013-04-12 MED ORDER — FUROSEMIDE 20 MG PO TABS: 20.0000 mg | ORAL_TABLET | Freq: Every day | ORAL | Status: DC

## 2013-04-12 MED ORDER — GLYBURIDE-METFORMIN 5-500 MG PO TABS: ORAL_TABLET | ORAL | Status: DC

## 2013-04-12 MED ORDER — RANITIDINE HCL 150 MG PO TABS: ORAL_TABLET | ORAL | Status: DC

## 2013-04-12 NOTE — Telephone Encounter (Signed)
Changed directions in chart, advised pharmacist.

## 2013-04-12 NOTE — Addendum Note (Signed)
Addended by: Eliezer Bottom on: 04/12/2013 02:16 PM   Modules accepted: Orders

## 2013-04-12 NOTE — Telephone Encounter (Signed)
Please see note below, ok to change directions?

## 2013-04-12 NOTE — Telephone Encounter (Signed)
Yes ok to change

## 2013-04-12 NOTE — Telephone Encounter (Signed)
[facility] [address] faxed message requesting change in instructions for furosemide 20 mg to take one tab by mouth in morning. (pt has been taking in AM).Please advise.

## 2013-06-07 ENCOUNTER — Other Ambulatory Visit: Payer: Self-pay | Admitting: *Deleted

## 2013-06-07 MED ORDER — BUSPIRONE HCL 10 MG PO TABS: ORAL_TABLET | ORAL | Status: DC

## 2013-06-08 ENCOUNTER — Telehealth: Payer: Self-pay

## 2013-06-08 NOTE — Telephone Encounter (Signed)
Pt left v/m requesting samples of Lantus; pt will not get pd until 06/23/13.Please advise.

## 2013-06-08 NOTE — Telephone Encounter (Signed)
Called pt and informed her that she can pick up sample today.

## 2013-06-08 NOTE — Telephone Encounter (Signed)
Ok to give if we have them

## 2013-07-12 ENCOUNTER — Telehealth: Payer: Self-pay

## 2013-07-12 NOTE — Telephone Encounter (Signed)
Pt request samples of Lantus pens due to expense; pt said she usually gets 3 pens; pt will not have finances to purchase at pharmacy until mid Nov. Please advise.

## 2013-07-12 NOTE — Telephone Encounter (Signed)
If we have it, then give it to the patient.  If we don't and she can't get it, then notify us to see what we can do.  Thanks.

## 2013-07-13 ENCOUNTER — Other Ambulatory Visit: Payer: Self-pay | Admitting: *Deleted

## 2013-07-13 MED ORDER — FLUOXETINE HCL 20 MG PO CAPS: ORAL_CAPSULE | ORAL | Status: DC

## 2013-07-13 NOTE — Telephone Encounter (Signed)
Patient advised.   Her name is placed on 3 pens in the refrigerator at the front desk to await her husband picking them up.

## 2013-09-07 ENCOUNTER — Ambulatory Visit (INDEPENDENT_AMBULATORY_CARE_PROVIDER_SITE_OTHER): Payer: Medicare Other

## 2013-09-07 DIAGNOSIS — Z23 Encounter for immunization: Secondary | ICD-10-CM

## 2013-09-08 DIAGNOSIS — Z23 Encounter for immunization: Secondary | ICD-10-CM

## 2013-09-14 ENCOUNTER — Other Ambulatory Visit: Payer: Self-pay

## 2013-09-14 MED ORDER — LISINOPRIL 10 MG PO TABS: ORAL_TABLET | ORAL | Status: DC

## 2013-09-14 MED ORDER — BUSPIRONE HCL 10 MG PO TABS: ORAL_TABLET | ORAL | Status: DC

## 2013-09-14 NOTE — Telephone Encounter (Signed)
Last office visit and labs were 08/2012--please advise

## 2013-09-14 NOTE — Telephone Encounter (Signed)
Ok to refill one time.  Needs CPX for further refills.

## 2013-09-30 ENCOUNTER — Other Ambulatory Visit: Payer: Self-pay | Admitting: *Deleted

## 2013-09-30 MED ORDER — INSULIN GLARGINE 100 UNIT/ML ~~LOC~~ SOLN: 20.0000 [IU] | Freq: Every day | SUBCUTANEOUS | Status: DC

## 2013-09-30 NOTE — Telephone Encounter (Signed)
Faxed refill request. Patient has not been seen in greater than 1 year with appt scheduled 10/06/13.  According to refill protocol, please advise.

## 2013-10-06 ENCOUNTER — Ambulatory Visit: Payer: Medicare Other | Admitting: Family Medicine

## 2013-10-11 ENCOUNTER — Telehealth: Payer: Self-pay

## 2013-10-11 NOTE — Telephone Encounter (Signed)
Mr Sandles left v/m requesting samples of Lantus for pt if available. Request cb.

## 2013-10-11 NOTE — Telephone Encounter (Signed)
Left message on cell phone to return my call 

## 2013-10-11 NOTE — Telephone Encounter (Signed)
Gerene notified Lantus Solostar samples are available.  Patient has not been seen in over a year.  Patient will call and make an appointment. Husband has been in hospital and now that he is home, she don't like leaving him home alone.  Son will come to office to pick up samples. 2 Lantus Solostar pens samples given.  [id number] exp: 01-2016.

## 2013-10-11 NOTE — Telephone Encounter (Signed)
See below

## 2013-10-13 ENCOUNTER — Other Ambulatory Visit: Payer: Self-pay

## 2013-10-13 MED ORDER — FLUOXETINE HCL 20 MG PO CAPS: ORAL_CAPSULE | ORAL | Status: DC

## 2013-10-13 NOTE — Telephone Encounter (Signed)
Last filled 09/12/13--Last OV 12/13--however pt has an appt with you 10/27/13--please advise

## 2013-10-18 ENCOUNTER — Other Ambulatory Visit: Payer: Self-pay | Admitting: *Deleted

## 2013-10-18 MED ORDER — LISINOPRIL 10 MG PO TABS: ORAL_TABLET | ORAL | Status: DC

## 2013-10-18 MED ORDER — BUSPIRONE HCL 10 MG PO TABS: ORAL_TABLET | ORAL | Status: DC

## 2013-10-18 NOTE — Telephone Encounter (Signed)
Pt has med f/u scheduled 10/27/13.

## 2013-10-20 ENCOUNTER — Emergency Department (HOSPITAL_COMMUNITY)
Admission: EM | Admit: 2013-10-20 | Discharge: 2013-10-20 | Disposition: A | Discharge status: Home / Self Care | Payer: Medicare Other | Attending: Emergency Medicine | Admitting: Emergency Medicine

## 2013-10-20 ENCOUNTER — Emergency Department (HOSPITAL_COMMUNITY): Payer: Medicare Other

## 2013-10-20 ENCOUNTER — Encounter (HOSPITAL_COMMUNITY): Payer: Self-pay | Admitting: Emergency Medicine

## 2013-10-20 DIAGNOSIS — Z862 Personal history of diseases of the blood and blood-forming organs and certain disorders involving the immune mechanism: Secondary | ICD-10-CM | POA: Insufficient documentation

## 2013-10-20 DIAGNOSIS — F329 Major depressive disorder, single episode, unspecified: Secondary | ICD-10-CM | POA: Insufficient documentation

## 2013-10-20 DIAGNOSIS — E785 Hyperlipidemia, unspecified: Secondary | ICD-10-CM | POA: Insufficient documentation

## 2013-10-20 DIAGNOSIS — Z794 Long term (current) use of insulin: Secondary | ICD-10-CM | POA: Insufficient documentation

## 2013-10-20 DIAGNOSIS — I5032 Chronic diastolic (congestive) heart failure: Secondary | ICD-10-CM | POA: Insufficient documentation

## 2013-10-20 DIAGNOSIS — R42 Dizziness and giddiness: Secondary | ICD-10-CM | POA: Insufficient documentation

## 2013-10-20 DIAGNOSIS — Z8669 Personal history of other diseases of the nervous system and sense organs: Secondary | ICD-10-CM | POA: Insufficient documentation

## 2013-10-20 DIAGNOSIS — F3289 Other specified depressive episodes: Secondary | ICD-10-CM | POA: Insufficient documentation

## 2013-10-20 DIAGNOSIS — Z87448 Personal history of other diseases of urinary system: Secondary | ICD-10-CM | POA: Insufficient documentation

## 2013-10-20 DIAGNOSIS — R55 Syncope and collapse: Secondary | ICD-10-CM | POA: Insufficient documentation

## 2013-10-20 DIAGNOSIS — J45909 Unspecified asthma, uncomplicated: Secondary | ICD-10-CM | POA: Insufficient documentation

## 2013-10-20 DIAGNOSIS — I10 Essential (primary) hypertension: Secondary | ICD-10-CM | POA: Insufficient documentation

## 2013-10-20 DIAGNOSIS — Z79899 Other long term (current) drug therapy: Secondary | ICD-10-CM | POA: Insufficient documentation

## 2013-10-20 DIAGNOSIS — E1159 Type 2 diabetes mellitus with other circulatory complications: Secondary | ICD-10-CM | POA: Insufficient documentation

## 2013-10-20 DIAGNOSIS — Z792 Long term (current) use of antibiotics: Secondary | ICD-10-CM | POA: Insufficient documentation

## 2013-10-20 DIAGNOSIS — N390 Urinary tract infection, site not specified: Secondary | ICD-10-CM

## 2013-10-20 DIAGNOSIS — R739 Hyperglycemia, unspecified: Secondary | ICD-10-CM

## 2013-10-20 DIAGNOSIS — F411 Generalized anxiety disorder: Secondary | ICD-10-CM | POA: Insufficient documentation

## 2013-10-20 DIAGNOSIS — Z872 Personal history of diseases of the skin and subcutaneous tissue: Secondary | ICD-10-CM | POA: Insufficient documentation

## 2013-10-20 DIAGNOSIS — Z9884 Bariatric surgery status: Secondary | ICD-10-CM | POA: Insufficient documentation

## 2013-10-20 LAB — URINALYSIS, ROUTINE W REFLEX MICROSCOPIC
Bilirubin Urine: NEGATIVE
Hgb urine dipstick: NEGATIVE
KETONES UR: NEGATIVE mg/dL
NITRITE: NEGATIVE
PROTEIN: NEGATIVE mg/dL
Specific Gravity, Urine: 1.023 (ref 1.005–1.030)
Urobilinogen, UA: 0.2 mg/dL (ref 0.0–1.0)
pH: 5 (ref 5.0–8.0)

## 2013-10-20 LAB — CBC
HEMATOCRIT: 37.6 % (ref 36.0–46.0)
HEMOGLOBIN: 13.1 g/dL (ref 12.0–15.0)
MCH: 28.9 pg (ref 26.0–34.0)
MCHC: 34.8 g/dL (ref 30.0–36.0)
MCV: 82.8 fL (ref 78.0–100.0)
PLATELETS: 262 10*3/uL (ref 150–400)
RBC: 4.54 MIL/uL (ref 3.87–5.11)
RDW: 12.7 % (ref 11.5–15.5)
WBC: 15.2 10*3/uL — ABNORMAL HIGH (ref 4.0–10.5)

## 2013-10-20 LAB — BASIC METABOLIC PANEL
BUN: 14 mg/dL (ref 6–23)
CALCIUM: 8.5 mg/dL (ref 8.4–10.5)
CO2: 22 meq/L (ref 19–32)
CREATININE: 0.7 mg/dL (ref 0.50–1.10)
Chloride: 99 mEq/L (ref 96–112)
GFR calc Af Amer: >90 mL/min (ref >90)
GFR calc non Af Amer: 90 mL/min — ABNORMAL LOW (ref >90)
Glucose, Bld: 264 mg/dL — ABNORMAL HIGH (ref 70–99)
Potassium: 4.1 mEq/L (ref 3.7–5.3)
SODIUM: 137 meq/L (ref 137–147)

## 2013-10-20 LAB — URINE MICROSCOPIC-ADD ON

## 2013-10-20 MED ORDER — SODIUM CHLORIDE 0.9 % IV BOLUS (SEPSIS)
1000.0000 mL | Freq: Once | INTRAVENOUS | Status: AC
  Administered 2013-10-20: 1000 mL via INTRAVENOUS

## 2013-10-20 MED ORDER — ONDANSETRON HCL 4 MG/2ML IJ SOLN
4.0000 mg | Freq: Once | INTRAMUSCULAR | Status: AC
  Administered 2013-10-20: 4 mg via INTRAVENOUS
  Filled 2013-10-20: qty 2

## 2013-10-20 MED ORDER — CEPHALEXIN 500 MG PO CAPS: 500.0000 mg | ORAL_CAPSULE | Freq: Four times a day (QID) | ORAL | Status: DC

## 2013-10-20 MED ORDER — ONDANSETRON HCL 4 MG PO TABS: 4.0000 mg | ORAL_TABLET | Freq: Four times a day (QID) | ORAL | Status: DC

## 2013-10-20 NOTE — ED Notes (Signed)
MD at bedside. 

## 2013-10-20 NOTE — Discharge Instructions (Signed)
Return to the ED with any concerns including vomiting and not able to keep down liquids, fainting, chest pain, difficulty breathing, weakness in arms or legs, changes in vision or speech, decreased level of alertness/lethargy, or any other alarming symptoms

## 2013-10-20 NOTE — ED Notes (Signed)
Patient transported to X-Ray 

## 2013-10-20 NOTE — ED Notes (Signed)
Per EMS pt states around 12 am she began feeling nauseate but denies vomiting; pt denies pain; states she felt discomfort in abdomen. Pt has hx of vision problems but feels like they are getting worse. Pt denies pain on arrival. Pt has had 4 mg of Zofran. Hx HTN, torn Retina;

## 2013-10-20 NOTE — ED Provider Notes (Signed)
CSN: [id number]     Arrival date & time 10/20/13  0204 History   First MD Initiated Contact with Patient 10/20/13 0226     Chief Complaint  Patient presents with  . Nausea   (Consider location/radiation/quality/duration/timing/severity/associated sxs/prior Treatment) HPI Pt presenting with c/o near syncope. Pt states she got up to use the bathroom tonight, she began to feel nauseated and lightheaded, she felt she was about to faint and quickly sat down on a chair.  She felt her blood sugar may be low, so she took glucose tab.  No chest pain, no palpiations.  No vertigo.  No loss of consciousness.  She states she felt tunnel vision.  No vomiting or seiuzre activity.  Pt continues to feel mild nausea, feels better lying down, states she feels lightheaded upon sitting or standing.  Of note, she was in the ER today with her husband and did not eat or drink per her normal routine.  There are no other associated systemic symptoms, there are no other alleviating or modifying factors.   Past Medical History  Diagnosis Date  . Chronic diastolic heart failure   . Congestive heart failure, unspecified   . Other dyspnea and respiratory abnormality   . Essential hypertension, benign   . Other and unspecified hyperlipidemia   . Type II or unspecified type diabetes mellitus with peripheral circulatory disorders, not stated as uncontrolled(250.70)   . Anxiety state, unspecified   . Depressive disorder, not elsewhere classified   . Unspecified asthma, with exacerbation   . Recent retinal detachment, partial, with giant tear   . Anemia, unspecified   . Wheezing   . Acute upper respiratory infections of unspecified site   . Contact dermatitis and other eczema, due to unspecified cause   . Other diseases of lung, not elsewhere classified   . Unspecified urinary incontinence   . Cellulitis   . Mastoiditis   . Diabetes mellitus    Past Surgical History  Procedure Laterality Date  . Knee surgery  2003   meniscus  . Eye surgery      Left retina repair- 11/09, 02-20-09  . Laparoscopic gastric banding  08/21/10  . Abdominal hysterectomy    . Dilation and curettage of uterus     Family History  Problem Relation Age of Onset  . Cancer Mother     breast  . Cancer Sister     breast  . Cancer Maternal Aunt     breast  . Cancer Maternal Uncle     breast  . Cancer Maternal Grandmother     breast   History  Substance Use Topics  . Smoking status: Never Smoker   . Smokeless tobacco: Never Used  . Alcohol Use: No   OB History   Grav Para Term Preterm Abortions TAB SAB Ect Mult Living                 Review of Systems ROS reviewed and all otherwise negative except for mentioned in HPI  Allergies  Aspirin and Codeine  Home Medications   Current Outpatient Rx  Name  Route  Sig  Dispense  Refill  . albuterol (PROVENTIL,VENTOLIN) 90 MCG/ACT inhaler   Inhalation   Inhale 2 puffs into the lungs every 6 (six) hours as needed. Inhale 2 puffs using inhaler once a day as needed.   17 g   2   . busPIRone (BUSPAR) 10 MG tablet   Oral   Take 10 mg by mouth at bedtime.         Marland Kitchen  FLUoxetine (PROZAC) 20 MG capsule   Oral   Take 60 mg by mouth daily.         . furosemide (LASIX) 20 MG tablet   Oral   Take 20 mg by mouth daily. Take one by mouth in the morning.         . glyBURIDE-metformin (GLUCOVANCE) 5-500 MG per tablet   Oral   Take 1 tablet by mouth 2 (two) times daily with a meal.         . insulin glargine (LANTUS) 100 UNIT/ML injection   Subcutaneous   Inject 25 Units into the skin at bedtime.         Marland Kitchen lisinopril (PRINIVIL,ZESTRIL) 10 MG tablet   Oral   Take 10 mg by mouth daily.         Marland Kitchen lovastatin (MEVACOR) 20 MG tablet   Oral   Take 20 mg by mouth at bedtime.         . ranitidine (ZANTAC) 150 MG tablet   Oral   Take 150 mg by mouth 2 (two) times daily.         . cephALEXin (KEFLEX) 500 MG capsule   Oral   Take 1 capsule (500 mg total) by  mouth 4 (four) times daily.   28 capsule   0   . glucose blood (ONE TOUCH ULTRA TEST) test strip      Check blood sugar four times a day. Dx 250.70   100 each   5   . ondansetron (ZOFRAN) 4 MG tablet   Oral   Take 1 tablet (4 mg total) by mouth every 6 (six) hours.   12 tablet   0    BP 124/62  Pulse 88  Temp(Src) 97.6 F (36.4 C) (Oral)  Resp 16  SpO2 96% Vitals reviewed Physical Exam Physical Examination: General appearance - alert, well appearing, and in no distress Mental status - alert, oriented to person, place, and time Eyes - pupils equal and reactive, extraocular eye movements intact, no nystagmus Mouth - mucous membranes moist, pharynx normal without lesions Chest - clear to auscultation, no wheezes, rales or rhonchi, symmetric air entry Heart - normal rate, regular rhythm, normal S1, S2, no murmurs, rubs, clicks or gallops Abdomen - soft, nontender, nondistended, no masses or organomegaly Neurological - alert, oriented, normal speech, cranial nerves grossly intact, strength 5/5 in extremities x 4, sensation intact Extremities - peripheral pulses normal, no pedal edema, no clubbing or cyanosis Skin - normal coloration and turgor, no rashes  ED Course  Procedures (including critical care time) Labs Review Labs Reviewed  CBC - Abnormal; Notable for the following:    WBC 15.2 (*)    All other components within normal limits  BASIC METABOLIC PANEL - Abnormal; Notable for the following:    Glucose, Bld 264 (*)    GFR calc non Af Amer 90 (*)    All other components within normal limits  URINALYSIS, ROUTINE W REFLEX MICROSCOPIC - Abnormal; Notable for the following:    APPearance CLOUDY (*)    Glucose, UA >1000 (*)    Leukocytes, UA SMALL (*)    All other components within normal limits  URINE MICROSCOPIC-ADD ON - Abnormal; Notable for the following:    Bacteria, UA FEW (*)    Casts HYALINE CASTS (*)    Crystals CA OXALATE CRYSTALS (*)    All other  components within normal limits  URINE CULTURE   Imaging Review Dg Chest 2 View  10/20/2013   CLINICAL DATA:  Nausea and syncope  EXAM: CHEST  2 VIEW  COMPARISON:  12/04/2012  FINDINGS: Normal heart size and mediastinal contours. No acute infiltrate or edema. No effusion or pneumothorax. No acute osseous findings.  IMPRESSION: No active cardiopulmonary disease.   Electronically Signed   By: Jorje Guild M.D.   On: 10/20/2013 05:09    EKG Interpretation   None      Date: 10/20/2013  Rate: 84  Rhythm: normal sinus rhythm  QRS Axis: normal  Intervals: normal  ST/T Wave abnormalities: nonspecific ST/T changes/artifact  Conduction Disutrbances:none  Narrative Interpretation:   Old EKG Reviewed: unchanged EKG not available in epic for interpretation in muse  MDM   1. Near syncope   2. Urinary tract infection   3. Hyperglycemia    Pt presenting after near syncopal event.  EKG labs reassuring with the exception of elevated glucose, glucosuria, and urinalysis c/w UTI- urine culture pending.  Pt feels improved after IV hydration.  Will discharge on antibiotics for UTI.  Discharged with strict return precautions.  Pt agreeable with plan.    Threasa Beards, MD 10/20/13 [phone]

## 2013-10-21 LAB — URINE CULTURE: Colony Count: >=100000

## 2013-10-27 ENCOUNTER — Telehealth: Payer: Self-pay | Admitting: Family Medicine

## 2013-10-27 ENCOUNTER — Encounter: Payer: Self-pay | Admitting: Family Medicine

## 2013-10-27 ENCOUNTER — Ambulatory Visit (INDEPENDENT_AMBULATORY_CARE_PROVIDER_SITE_OTHER): Payer: Medicare Other | Admitting: Family Medicine

## 2013-10-27 VITALS — BP 116/62 | HR 86 | Temp 97.8°F | Wt 234.5 lb

## 2013-10-27 DIAGNOSIS — E669 Obesity, unspecified: Secondary | ICD-10-CM

## 2013-10-27 DIAGNOSIS — E785 Hyperlipidemia, unspecified: Secondary | ICD-10-CM

## 2013-10-27 DIAGNOSIS — E1159 Type 2 diabetes mellitus with other circulatory complications: Secondary | ICD-10-CM

## 2013-10-27 LAB — MICROALBUMIN / CREATININE URINE RATIO
Creatinine,U: 308.3 mg/dL
MICROALB UR: 3 mg/dL — AB (ref 0.0–1.9)
Microalb Creat Ratio: 1 mg/g (ref 0.0–30.0)

## 2013-10-27 LAB — COMPREHENSIVE METABOLIC PANEL
ALBUMIN: 3.8 g/dL (ref 3.5–5.2)
ALK PHOS: 60 U/L (ref 39–117)
ALT: 22 U/L (ref 0–35)
AST: 17 U/L (ref 0–37)
BUN: 11 mg/dL (ref 6–23)
CALCIUM: 9 mg/dL (ref 8.4–10.5)
CHLORIDE: 102 meq/L (ref 96–112)
CO2: 28 mEq/L (ref 19–32)
Creatinine, Ser: 0.8 mg/dL (ref 0.4–1.2)
GFR: 78.92 mL/min (ref >60.00)
GLUCOSE: 166 mg/dL — AB (ref 70–99)
POTASSIUM: 4.1 meq/L (ref 3.5–5.1)
Sodium: 138 mEq/L (ref 135–145)
TOTAL PROTEIN: 7.2 g/dL (ref 6.0–8.3)
Total Bilirubin: 0.6 mg/dL (ref 0.3–1.2)

## 2013-10-27 LAB — LIPID PANEL
CHOLESTEROL: 178 mg/dL (ref 0–200)
HDL: 38.9 mg/dL — ABNORMAL LOW (ref >39.00)
LDL CALC: 106 mg/dL — AB (ref 0–99)
TRIGLYCERIDES: 167 mg/dL — AB (ref 0.0–149.0)
Total CHOL/HDL Ratio: 5
VLDL: 33.4 mg/dL (ref 0.0–40.0)

## 2013-10-27 LAB — HEMOGLOBIN A1C: Hgb A1c MFr Bld: 7.5 % — ABNORMAL HIGH (ref 4.6–6.5)

## 2013-10-27 NOTE — Telephone Encounter (Signed)
Pt is wanting to know if we could mail her a copy of her lab work when it comes back.

## 2013-10-27 NOTE — Telephone Encounter (Signed)
Message received. I can mail it to her, but she may also receive a call depending on whether results are normal/abnormal.

## 2013-10-27 NOTE — Assessment & Plan Note (Signed)
Improved.  Encouraged to continue weight loss strategies. The patient indicates understanding of these issues and agrees with the plan.

## 2013-10-27 NOTE — Assessment & Plan Note (Signed)
Continue Mevacor. Check lipid panel today. Orders Placed This Encounter  Procedures  . Hemoglobin A1c  . Microalbumin/Creatinine Ratio, Urine  . Comprehensive metabolic panel  . Lipid panel  . HM Diabetes Foot Exam

## 2013-10-27 NOTE — Patient Instructions (Signed)
Great to see you. I will call you with your lab results. Hang in there.   

## 2013-10-27 NOTE — Progress Notes (Signed)
Pre-visit discussion using our clinic review tool. No additional management support is needed unless otherwise documented below in the visit note.  

## 2013-10-27 NOTE — Assessment & Plan Note (Signed)
No change in rx today. Check a1c first. The patient indicates understanding of these issues and agrees with the plan.

## 2013-10-27 NOTE — Progress Notes (Signed)
63 yo here follow up/med refill.  Has not been seen in over a year for routine follow up.  Obesity-  Had lap band procedure on 08/21/10.   Has been doing better with her diet past few months-has lost weight. Wt Readings from Last 3 Encounters:  10/27/13 234 lb 8 oz (106.369 kg)  08/27/12 239 lb (108.41 kg)  06/17/12 249 lb (112.946 kg)    .  DM- CBGs have been lower than have been in years.   Checks CBGs daily- fasting usually around 70s- 80s. Denies any episodes of hypoglycemia.   Has toe nails cut every 4 weeks. Lab Results  Component Value Date   HGBA1C 7.9* 11/26/2012   Checks CBGs daily- never above 130 fasting. Taking Lantus 25 units at bedtime, Glucvance 5-500 1 tab two times a day.  HLD- On Mevacor 20 mg daily.  Denies myalgias. Lab Results  Component Value Date   CHOL 164 08/27/2012   HDL 37.30* 08/27/2012   LDLCALC 101* 08/27/2012   TRIG 130.0 08/27/2012   CHOLHDL 4 08/27/2012    HTN- well controlled. Denies HA, blurred vision or CP.  Patient Active Problem List   Diagnosis Date Noted  . OBESITY, UNSPECIFIED 12/29/2009  . POSTMENOPAUSAL STATUS 08/09/2009  . RETINAL DETACHMENT 05/10/2009  . DIASTOLIC HEART FAILURE, CHRONIC 02/28/2009  . DYSPNEA ON EXERTION 02/28/2009  . RECENT RETINAL DETACHMENT PARTIAL W/GIANT TEAR 07/24/2008  . ANEMIA 12/08/2007  . ASTHMA, WITH ACUTE EXACERBATION 12/08/2007  . HYPERTENSION, BENIGN 11/03/2007  . WHEEZING 11/02/2007  . DIABETES MELLITUS, TYPE II, CONTROLLED, W/VASCULAR COMPS 05/05/2007  . HYPERLIPIDEMIA 05/05/2007  . ANXIETY 05/05/2007  . DEPRESSION 05/05/2007  . Contact Dermatitis and Other Eczema, due to Unspecified Cause 05/05/2007  . CHF 01/20/2007  . LUNG NODULE 01/20/2007   Past Medical History  Diagnosis Date  . Chronic diastolic heart failure   . Congestive heart failure, unspecified   . Other dyspnea and respiratory abnormality   . Essential hypertension, benign   . Other and unspecified hyperlipidemia   .  Type II or unspecified type diabetes mellitus with peripheral circulatory disorders, not stated as uncontrolled(250.70)   . Anxiety state, unspecified   . Depressive disorder, not elsewhere classified   . Unspecified asthma, with exacerbation   . Recent retinal detachment, partial, with giant tear   . Anemia, unspecified   . Wheezing   . Acute upper respiratory infections of unspecified site   . Contact dermatitis and other eczema, due to unspecified cause   . Other diseases of lung, not elsewhere classified   . Unspecified urinary incontinence   . Cellulitis   . Mastoiditis   . Diabetes mellitus    Past Surgical History  Procedure Laterality Date  . Knee surgery  2003    meniscus  . Eye surgery      Left retina repair- 11/09, 02-20-09  . Laparoscopic gastric banding  08/21/10  . Abdominal hysterectomy    . Dilation and curettage of uterus     History  Substance Use Topics  . Smoking status: Never Smoker   . Smokeless tobacco: Never Used  . Alcohol Use: No   Family History  Problem Relation Age of Onset  . Cancer Mother     breast  . Cancer Sister     breast  . Cancer Maternal Aunt     breast  . Cancer Maternal Uncle     breast  . Cancer Maternal Grandmother     breast   Allergies  Allergen  Reactions  . Aspirin     unknown  . Codeine     REACTION: UNSPECIFIED   Current Outpatient Prescriptions on File Prior to Visit  Medication Sig Dispense Refill  . albuterol (PROVENTIL,VENTOLIN) 90 MCG/ACT inhaler Inhale 2 puffs into the lungs every 6 (six) hours as needed. Inhale 2 puffs using inhaler once a day as needed.  17 g  2  . busPIRone (BUSPAR) 10 MG tablet Take 10 mg by mouth at bedtime.      Marland Kitchen FLUoxetine (PROZAC) 20 MG capsule Take 60 mg by mouth daily.      . furosemide (LASIX) 20 MG tablet Take 20 mg by mouth daily. Take one by mouth in the morning.      Marland Kitchen glucose blood (ONE TOUCH ULTRA TEST) test strip Check blood sugar four times a day. Dx 250.70  100 each  5   . glyBURIDE-metformin (GLUCOVANCE) 5-500 MG per tablet Take 1 tablet by mouth 2 (two) times daily with a meal.      . insulin glargine (LANTUS) 100 UNIT/ML injection Inject 25 Units into the skin at bedtime.      Marland Kitchen lisinopril (PRINIVIL,ZESTRIL) 10 MG tablet Take 10 mg by mouth daily.      Marland Kitchen lovastatin (MEVACOR) 20 MG tablet Take 20 mg by mouth at bedtime.      . ranitidine (ZANTAC) 150 MG tablet Take 150 mg by mouth 2 (two) times daily.       No current facility-administered medications on file prior to visit.     Review of Systems  See HPI  General: Denies malaise.  GI: Denies abdominal pain.   Physical Exam  BP 116/62  Pulse 86  Temp(Src) 97.8 F (36.6 C) (Oral)  Wt 234 lb 8 oz (106.369 kg)  SpO2 97%   General:  Well-developed,well-nourished,in no acute distress; alert,appropriate and cooperative throughout examination Head:  normocephalic and atraumatic.   Eyes:  vision grossly intact, pupils equal, pupils round, and pupils reactive to light.   Ears:  R ear normal and L ear normal.   Nose:  no external deformity.   Mouth:  good dentition.   Lungs:  Normal respiratory effort, chest expands symmetrically. Lungs are clear to auscultation, no crackles or wheezes. Heart:  Normal rate and regular rhythm. S1 and S2 normal without gallop, murmur, click, rub or other extra sounds. Abdomen:  Bowel sounds positive,abdomen soft and non-tender without masses, organomegaly or hernias noted. Msk:  No deformity or scoliosis noted of thoracic or lumbar spine.   Extremities:  No clubbing, cyanosis, edema, or deformity noted with normal full range of motion of all joints.   Neurologic:  alert & oriented X3 and gait normal.   Skin:  Intact without suspicious lesions or rashes Cervical Nodes:  No lymphadenopathy noted Axillary Nodes:  No palpable lymphadenopathy Psych:  Cognition and judgment appear intact. Alert and cooperative with normal attention span and concentration. No apparent  delusions, illusions, hallucinations Diabetic foot exam: Normal inspection No skin breakdown No calluses  Normal DP pulses Normal sensation to light touch and monofilament Nails normal   Assessment and Plan:

## 2013-10-29 ENCOUNTER — Other Ambulatory Visit: Payer: Self-pay | Admitting: *Deleted

## 2013-10-29 MED ORDER — INSULIN GLARGINE 100 UNIT/ML ~~LOC~~ SOLN: 28.0000 [IU] | Freq: Every day | SUBCUTANEOUS | Status: DC

## 2013-11-04 ENCOUNTER — Other Ambulatory Visit: Payer: Self-pay

## 2013-11-04 ENCOUNTER — Telehealth: Payer: Self-pay | Admitting: *Deleted

## 2013-11-04 MED ORDER — INSULIN GLARGINE 100 UNIT/ML ~~LOC~~ SOLN: 28.0000 [IU] | Freq: Every day | SUBCUTANEOUS | Status: DC

## 2013-11-04 NOTE — Telephone Encounter (Signed)
Pt said [facility] [address] is supposed to have requested refill of lantus earlier in week and pt is at pharmacy and out of med. Medication phoned to [place] pharmacy spoke with Joellen Jersey as instructed.pt notified done.

## 2013-11-04 NOTE — Telephone Encounter (Signed)
Rx called into pharmacy per [facility]

## 2013-11-08 ENCOUNTER — Encounter: Payer: Self-pay | Admitting: Family Medicine

## 2013-11-08 ENCOUNTER — Ambulatory Visit (INDEPENDENT_AMBULATORY_CARE_PROVIDER_SITE_OTHER): Payer: Medicare Other | Admitting: Family Medicine

## 2013-11-08 ENCOUNTER — Telehealth: Payer: Self-pay

## 2013-11-08 ENCOUNTER — Ambulatory Visit (INDEPENDENT_AMBULATORY_CARE_PROVIDER_SITE_OTHER)
Admission: RE | Admit: 2013-11-08 | Discharge: 2013-11-08 | Disposition: A | Discharge status: Home / Self Care | Payer: Medicare Other | Source: Ambulatory Visit | Attending: Family Medicine | Admitting: Family Medicine

## 2013-11-08 VITALS — BP 124/68 | HR 69 | Temp 98.0°F | Wt 236.2 lb

## 2013-11-08 DIAGNOSIS — M25561 Pain in right knee: Secondary | ICD-10-CM | POA: Insufficient documentation

## 2013-11-08 DIAGNOSIS — M25569 Pain in unspecified knee: Secondary | ICD-10-CM

## 2013-11-08 MED ORDER — FUROSEMIDE 20 MG PO TABS: 20.0000 mg | ORAL_TABLET | Freq: Every day | ORAL | Status: DC

## 2013-11-08 NOTE — Progress Notes (Signed)
Subjective:   Patient ID: [name], female    DOB: [birth year], 63 y.o.   MRN: [id number]  [name] is a pleasant 63 y.o. year old female who presents to clinic today with Knee Pain  on 11/08/2013  HPI: Was sitting for 20 minutes on a stool and stood up and knee felt strange, right >left.  States "my bones feel normal" but "the meat behind my knees feels strange."  It is getting a little better.  No fevers or chills.  Never had anything like this before.  Patient Active Problem List   Diagnosis Date Noted  . OBESITY, UNSPECIFIED 12/29/2009  . POSTMENOPAUSAL STATUS 08/09/2009  . RETINAL DETACHMENT 05/10/2009  . DIASTOLIC HEART FAILURE, CHRONIC 02/28/2009  . DYSPNEA ON EXERTION 02/28/2009  . RECENT RETINAL DETACHMENT PARTIAL W/GIANT TEAR 07/24/2008  . ANEMIA 12/08/2007  . ASTHMA, WITH ACUTE EXACERBATION 12/08/2007  . HYPERTENSION, BENIGN 11/03/2007  . WHEEZING 11/02/2007  . Type II or unspecified type diabetes mellitus without mention of complication, uncontrolled 05/05/2007  . HYPERLIPIDEMIA 05/05/2007  . ANXIETY 05/05/2007  . DEPRESSION 05/05/2007  . Contact Dermatitis and Other Eczema, due to Unspecified Cause 05/05/2007  . CHF 01/20/2007  . LUNG NODULE 01/20/2007   Past Medical History  Diagnosis Date  . Chronic diastolic heart failure   . Congestive heart failure, unspecified   . Other dyspnea and respiratory abnormality   . Essential hypertension, benign   . Other and unspecified hyperlipidemia   . Type II or unspecified type diabetes mellitus with peripheral circulatory disorders, not stated as uncontrolled(250.70)   . Anxiety state, unspecified   . Depressive disorder, not elsewhere classified   . Unspecified asthma, with exacerbation   . Recent retinal detachment, partial, with giant tear   . Anemia, unspecified   . Wheezing   . Acute upper respiratory infections of unspecified site   . Contact dermatitis and other eczema, due to unspecified cause     . Other diseases of lung, not elsewhere classified   . Unspecified urinary incontinence   . Cellulitis   . Mastoiditis   . Diabetes mellitus    Past Surgical History  Procedure Laterality Date  . Knee surgery  2003    meniscus  . Eye surgery      Left retina repair- 11/09, 02-20-09  . Laparoscopic gastric banding  08/21/10  . Abdominal hysterectomy    . Dilation and curettage of uterus     History  Substance Use Topics  . Smoking status: Never Smoker   . Smokeless tobacco: Never Used  . Alcohol Use: No   Family History  Problem Relation Age of Onset  . Cancer Mother     breast  . Cancer Sister     breast  . Cancer Maternal Aunt     breast  . Cancer Maternal Uncle     breast  . Cancer Maternal Grandmother     breast   Allergies  Allergen Reactions  . Aspirin     unknown  . Codeine     REACTION: UNSPECIFIED   Current Outpatient Prescriptions on File Prior to Visit  Medication Sig Dispense Refill  . albuterol (PROVENTIL,VENTOLIN) 90 MCG/ACT inhaler Inhale 2 puffs into the lungs every 6 (six) hours as needed. Inhale 2 puffs using inhaler once a day as needed.  17 g  2  . busPIRone (BUSPAR) 10 MG tablet Take 10 mg by mouth at bedtime.      Marland Kitchen FLUoxetine (PROZAC) 20 MG capsule Take 60  mg by mouth daily.      Marland Kitchen glucose blood (ONE TOUCH ULTRA TEST) test strip Check blood sugar four times a day. Dx 250.70  100 each  5  . glyBURIDE-metformin (GLUCOVANCE) 5-500 MG per tablet Take 1 tablet by mouth 2 (two) times daily with a meal.      . insulin glargine (LANTUS) 100 UNIT/ML injection Inject 0.28 mLs (28 Units total) into the skin at bedtime.  10 mL  5  . lisinopril (PRINIVIL,ZESTRIL) 10 MG tablet Take 10 mg by mouth daily.      Marland Kitchen lovastatin (MEVACOR) 20 MG tablet Take 20 mg by mouth at bedtime.      . ranitidine (ZANTAC) 150 MG tablet Take 150 mg by mouth 2 (two) times daily.       No current facility-administered medications on file prior to visit.   The PMH, PSH, Social  History, Family History, Medications, and allergies have been reviewed in [facility] and have been updated if relevant.   Review of Systems    See HPI Objective:    BP 124/68  Pulse 69  Temp(Src) 98 F (36.7 C) (Oral)  Wt 236 lb 4 oz (107.162 kg)  SpO2 97%   Physical Exam  Constitutional: She appears well-developed and well-nourished. No distress.  Musculoskeletal:       Right knee: She exhibits swelling. She exhibits normal range of motion, no effusion, no bony tenderness and no MCL laxity. No tenderness found. No medial joint line, no lateral joint line, no LCL and no patellar tendon tenderness noted.          Assessment & Plan:   Knee pain - Plan: DG Knee Complete 4 Views Right No Follow-up on file.

## 2013-11-08 NOTE — Progress Notes (Signed)
Pre visit review using our clinic review tool, if applicable. No additional management support is needed unless otherwise documented below in the visit note. 

## 2013-11-08 NOTE — Patient Instructions (Signed)
Baker Cyst  A Baker cyst is a sac-like structure that forms in the back of the knee. It is filled with the same fluid that is located in your knee. This fluid lubricates the bones and cartilage of the knee and allows them to move over each other more easily.  CAUSES   When the knee becomes injured or inflamed, increased fluid forms in the knee. When this happens, the joint lining is pushed out behind the knee and forms the Baker cyst. This cyst may also be caused by inflammation from arthritic conditions and infections.  SIGNS AND SYMPTOMS   A Baker cyst usually has no symptoms. When the cyst is substantially enlarged:  · You may feel pressure behind the knee, stiffness in the knee, or a mass in the area behind the knee.  · You may develop pain, redness, and swelling in the calf.  This can suggest a blood clot and requires evaluation by your health care provider.  DIAGNOSIS   A Baker cyst is most often found during an ultrasound exam. This exam may have been performed for other reasons, and the cyst was found incidentally. Sometimes an MRI is used. This picks up other problems within a joint that an ultrasound exam may not. If the Baker cyst developed immediately after an injury, X-ray exams may be used to diagnose the cyst.  TREATMENT   The treatment depends on the cause of the cyst. Anti-inflammatory medicines and rest often will be prescribed. If the cyst is caused by a bacterial infection, antibiotic medicines may be prescribed.   HOME CARE INSTRUCTIONS   · If the cyst was caused by an injury, for the first 24 hours, keep the injured leg elevated on 2 pillows while lying down.  · For the first 24 hours while you are awake, apply ice to the injured area:  · Put ice in a plastic bag.  · Place a towel between your skin and the bag.  · Leave the ice on for 20 minutes, 2 3 times a day.  · Only take over-the-counter or prescription medicines for pain, discomfort, or fever as directed by your health care  provider.  · Only take antibiotic medicine as directed. Make sure to finish it even if you start to feel better.  MAKE SURE YOU:   · Understand these instructions.  · Will watch your condition.  · Will get help right away if you are not doing well or get worse.  Document Released: 09/02/2005 Document Revised: 06/23/2013 Document Reviewed: 04/14/2013  [employer]® Patient Information ©2014 [employer].

## 2013-11-08 NOTE — Assessment & Plan Note (Signed)
?   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 Will get xray for further evaluation. The patient indicates understanding of these issues and agrees with the plan.

## 2013-11-08 NOTE — Telephone Encounter (Signed)
Pt left v/m requesting refill on meds; did not leave name of meds. I called pt and she said she has appt this afternoon and will get refilled when meds are reviewed at appt.

## 2013-11-10 ENCOUNTER — Other Ambulatory Visit: Payer: Self-pay

## 2013-11-10 MED ORDER — GLYBURIDE-METFORMIN 5-500 MG PO TABS: 1.0000 | ORAL_TABLET | Freq: Two times a day (BID) | ORAL | Status: DC

## 2013-11-10 MED ORDER — BUSPIRONE HCL 10 MG PO TABS: 10.0000 mg | ORAL_TABLET | Freq: Every day | ORAL | Status: DC

## 2013-11-10 MED ORDER — RANITIDINE HCL 150 MG PO TABS: 150.0000 mg | ORAL_TABLET | Freq: Two times a day (BID) | ORAL | Status: DC

## 2013-11-10 NOTE — Telephone Encounter (Signed)
Pt request refill buspar, glyburide metformin and ranitidine to [facility] [address]. Advised pt done.

## 2013-11-16 ENCOUNTER — Other Ambulatory Visit: Payer: Self-pay | Admitting: *Deleted

## 2013-11-16 MED ORDER — LISINOPRIL 10 MG PO TABS: 10.0000 mg | ORAL_TABLET | Freq: Every day | ORAL | Status: DC

## 2013-11-16 NOTE — Telephone Encounter (Signed)
Received faxed refill request from pharmacy. Refill sent electronically.

## 2013-12-14 ENCOUNTER — Other Ambulatory Visit: Payer: Self-pay | Admitting: *Deleted

## 2013-12-14 MED ORDER — FUROSEMIDE 20 MG PO TABS: 20.0000 mg | ORAL_TABLET | Freq: Every day | ORAL | Status: DC

## 2013-12-27 ENCOUNTER — Other Ambulatory Visit: Payer: Self-pay | Admitting: Family Medicine

## 2013-12-27 MED ORDER — LOVASTATIN 20 MG PO TABS: 20.0000 mg | ORAL_TABLET | Freq: Every day | ORAL | Status: DC

## 2014-01-12 ENCOUNTER — Other Ambulatory Visit: Payer: Self-pay | Admitting: *Deleted

## 2014-01-12 MED ORDER — FLUOXETINE HCL 20 MG PO CAPS: 60.0000 mg | ORAL_CAPSULE | Freq: Every day | ORAL | Status: DC

## 2014-01-12 NOTE — Telephone Encounter (Signed)
Pt requesting medication refill. Last ov 10/2013 with f/u 01/2014. Medication given at [facility]. Pls advise

## 2014-01-26 ENCOUNTER — Telehealth: Payer: Self-pay

## 2014-01-26 NOTE — Telephone Encounter (Signed)
Pt left v/m requesting another rx for generic prozac # 90 to [facility] [address]. Pt did not pick up the # 30 recently refilled. Pt request cb.

## 2014-01-27 MED ORDER — FLUOXETINE HCL 20 MG PO CAPS
60.0000 mg | ORAL_CAPSULE | Freq: Every day | ORAL | Status: DC
Start: ? — End: 2014-02-28

## 2014-01-27 NOTE — Telephone Encounter (Signed)
Spoke to pt and informed her Rx has been sent to requested pharmacy 

## 2014-01-27 NOTE — Telephone Encounter (Signed)
[name] left v/m requesting status of fluoxetine refill and request cb when refilled.

## 2014-01-31 ENCOUNTER — Ambulatory Visit: Payer: Medicare Other | Admitting: Family Medicine

## 2014-02-09 ENCOUNTER — Other Ambulatory Visit: Payer: Self-pay | Admitting: *Deleted

## 2014-02-09 MED ORDER — BUSPIRONE HCL 10 MG PO TABS: 10.0000 mg | ORAL_TABLET | Freq: Every day | ORAL | Status: DC

## 2014-02-23 ENCOUNTER — Ambulatory Visit: Payer: Self-pay | Admitting: Ophthalmology

## 2014-02-28 ENCOUNTER — Other Ambulatory Visit: Payer: Self-pay | Admitting: *Deleted

## 2014-02-28 MED ORDER — FLUOXETINE HCL 20 MG PO CAPS: 60.0000 mg | ORAL_CAPSULE | Freq: Every day | ORAL | Status: DC

## 2014-03-03 LAB — HM DIABETES EYE EXAM

## 2014-03-24 ENCOUNTER — Ambulatory Visit (INDEPENDENT_AMBULATORY_CARE_PROVIDER_SITE_OTHER): Payer: Medicare Other | Admitting: Family Medicine

## 2014-03-24 ENCOUNTER — Encounter: Payer: Self-pay | Admitting: Family Medicine

## 2014-03-24 VITALS — BP 128/74 | HR 66 | Temp 98.1°F | Wt 228.5 lb

## 2014-03-24 DIAGNOSIS — M19049 Primary osteoarthritis, unspecified hand: Secondary | ICD-10-CM

## 2014-03-24 DIAGNOSIS — E669 Obesity, unspecified: Secondary | ICD-10-CM

## 2014-03-24 DIAGNOSIS — E13319 Other specified diabetes mellitus with unspecified diabetic retinopathy without macular edema: Secondary | ICD-10-CM

## 2014-03-24 DIAGNOSIS — E11319 Type 2 diabetes mellitus with unspecified diabetic retinopathy without macular edema: Secondary | ICD-10-CM

## 2014-03-24 DIAGNOSIS — E1139 Type 2 diabetes mellitus with other diabetic ophthalmic complication: Secondary | ICD-10-CM

## 2014-03-24 LAB — COMPREHENSIVE METABOLIC PANEL
ALK PHOS: 61 U/L (ref 39–117)
ALT: 17 U/L (ref 0–35)
AST: 19 U/L (ref 0–37)
Albumin: 3.9 g/dL (ref 3.5–5.2)
BILIRUBIN TOTAL: 0.4 mg/dL (ref 0.2–1.2)
BUN: 12 mg/dL (ref 6–23)
CO2: 25 mEq/L (ref 19–32)
Calcium: 9.3 mg/dL (ref 8.4–10.5)
Chloride: 99 mEq/L (ref 96–112)
Creatinine, Ser: 0.7 mg/dL (ref 0.4–1.2)
GFR: 90.8 mL/min (ref >60.00)
GLUCOSE: 66 mg/dL — AB (ref 70–99)
POTASSIUM: 4.4 meq/L (ref 3.5–5.1)
SODIUM: 134 meq/L — AB (ref 135–145)
TOTAL PROTEIN: 7.5 g/dL (ref 6.0–8.3)

## 2014-03-24 LAB — MICROALBUMIN / CREATININE URINE RATIO
CREATININE, U: 272 mg/dL
MICROALB UR: 1.8 mg/dL (ref 0.0–1.9)
Microalb Creat Ratio: 0.7 mg/g (ref 0.0–30.0)

## 2014-03-24 LAB — HEMOGLOBIN A1C: HEMOGLOBIN A1C: 6.9 % — AB (ref 4.6–6.5)

## 2014-03-24 MED ORDER — BUSPIRONE HCL 10 MG PO TABS: 10.0000 mg | ORAL_TABLET | Freq: Every day | ORAL | Status: DC

## 2014-03-24 MED ORDER — RANITIDINE HCL 150 MG PO TABS: 150.0000 mg | ORAL_TABLET | Freq: Two times a day (BID) | ORAL | Status: DC

## 2014-03-24 MED ORDER — FLUOXETINE HCL 20 MG PO CAPS: 60.0000 mg | ORAL_CAPSULE | Freq: Every day | ORAL | Status: DC

## 2014-03-24 MED ORDER — FUROSEMIDE 20 MG PO TABS: 20.0000 mg | ORAL_TABLET | Freq: Every day | ORAL | Status: DC

## 2014-03-24 NOTE — Progress Notes (Signed)
Pre visit review using our clinic review tool, if applicable. No additional management support is needed unless otherwise documented below in the visit note. 

## 2014-03-24 NOTE — Patient Instructions (Signed)
Good to see you. I will call you with your lab results.   

## 2014-03-24 NOTE — Progress Notes (Signed)
63 yo here follow up/med refill.    Obesity-  Had lap band procedure on 08/21/10.   Has been doing better with her diet past few months-has lost weight. Wt Readings from Last 3 Encounters:  03/24/14 228 lb 8 oz (103.647 kg)  11/08/13 236 lb 4 oz (107.162 kg)  10/27/13 234 lb 8 oz (106.369 kg)    DM-    Checks CBGs daily- fasting usually around 70s- 80s.  Was 21 this am.    Has toe nails cut every 4 weeks.  Eye exam UTD.  Urine micro done this year. Lab Results  Component Value Date   HGBA1C 7.5* 10/27/2013   Checks FSBS daily- never above 130 fasting. Taking Lantus 22 units at bedtime because she was having low blood sugars, Glucvance 5-500 1 tab two times a day.  HLD- On Mevacor 20 mg daily.  Denies myalgias. Lab Results  Component Value Date   CHOL 178 10/27/2013   HDL 38.90* 10/27/2013   LDLCALC 106* 10/27/2013   TRIG 167.0* 10/27/2013   CHOLHDL 5 10/27/2013    HTN- well controlled. Denies HA, blurred vision or CP.  Patient Active Problem List   Diagnosis Date Noted  . Right knee pain 11/08/2013  . OBESITY, UNSPECIFIED 12/29/2009  . POSTMENOPAUSAL STATUS 08/09/2009  . RETINAL DETACHMENT 05/10/2009  . DIASTOLIC HEART FAILURE, CHRONIC 02/28/2009  . DYSPNEA ON EXERTION 02/28/2009  . RECENT RETINAL DETACHMENT PARTIAL W/GIANT TEAR 07/24/2008  . ANEMIA 12/08/2007  . ASTHMA, WITH ACUTE EXACERBATION 12/08/2007  . HYPERTENSION, BENIGN 11/03/2007  . WHEEZING 11/02/2007  . Diabetes with retinopathy 05/05/2007  . HYPERLIPIDEMIA 05/05/2007  . ANXIETY 05/05/2007  . DEPRESSION 05/05/2007  . Contact Dermatitis and Other Eczema, due to Unspecified Cause 05/05/2007  . CHF 01/20/2007  . LUNG NODULE 01/20/2007   Past Medical History  Diagnosis Date  . Chronic diastolic heart failure   . Congestive heart failure, unspecified   . Other dyspnea and respiratory abnormality   . Essential hypertension, benign   . Other and unspecified hyperlipidemia   . Type II or unspecified type  diabetes mellitus with peripheral circulatory disorders, not stated as uncontrolled(250.70)   . Anxiety state, unspecified   . Depressive disorder, not elsewhere classified   . Unspecified asthma, with exacerbation   . Recent retinal detachment, partial, with giant tear   . Anemia, unspecified   . Wheezing   . Acute upper respiratory infections of unspecified site   . Contact dermatitis and other eczema, due to unspecified cause   . Other diseases of lung, not elsewhere classified   . Unspecified urinary incontinence   . Cellulitis   . Mastoiditis   . Diabetes mellitus    Past Surgical History  Procedure Laterality Date  . Knee surgery  2003    meniscus  . Eye surgery      Left retina repair- 11/09, 02-20-09  . Laparoscopic gastric banding  08/21/10  . Abdominal hysterectomy    . Dilation and curettage of uterus     History  Substance Use Topics  . Smoking status: Never Smoker   . Smokeless tobacco: Never Used  . Alcohol Use: No   Family History  Problem Relation Age of Onset  . Cancer Mother     breast  . Cancer Sister     breast  . Cancer Maternal Aunt     breast  . Cancer Maternal Uncle     breast  . Cancer Maternal Grandmother     breast  Allergies  Allergen Reactions  . Aspirin     unknown  . Codeine     REACTION: UNSPECIFIED   Current Outpatient Prescriptions on File Prior to Visit  Medication Sig Dispense Refill  . albuterol (PROVENTIL,VENTOLIN) 90 MCG/ACT inhaler Inhale 2 puffs into the lungs every 6 (six) hours as needed. Inhale 2 puffs using inhaler once a day as needed.  17 g  2  . glucose blood (ONE TOUCH ULTRA TEST) test strip Check blood sugar four times a day. Dx 250.70  100 each  5  . glyBURIDE-metformin (GLUCOVANCE) 5-500 MG per tablet Take 1 tablet by mouth 2 (two) times daily with a meal.  60 tablet  5  . insulin glargine (LANTUS) 100 UNIT/ML injection Inject 0.28 mLs (28 Units total) into the skin at bedtime.  10 mL  5  . lisinopril  (PRINIVIL,ZESTRIL) 10 MG tablet Take 1 tablet (10 mg total) by mouth daily.  30 tablet  5  . lovastatin (MEVACOR) 20 MG tablet Take 1 tablet (20 mg total) by mouth at bedtime.  90 tablet  1   No current facility-administered medications on file prior to visit.     Review of Systems  See HPI  General: Denies malaise.  GI: Denies abdominal pain.   Physical Exam  BP 128/74  Pulse 66  Temp(Src) 98.1 F (36.7 C) (Oral)  Wt 228 lb 8 oz (103.647 kg)  SpO2 97%   General:  Well-developed,well-nourished,in no acute distress; alert,appropriate and cooperative throughout examination Head:  normocephalic and atraumatic.   Eyes:  vision grossly intact, pupils equal, pupils round, and pupils reactive to light.   Ears:  R ear normal and L ear normal.   Nose:  no external deformity.   Mouth:  good dentition.   Lungs:  Normal respiratory effort, chest expands symmetrically. Lungs are clear to auscultation, no crackles or wheezes. Heart:  Normal rate and regular rhythm. S1 and S2 normal without gallop, murmur, click, rub or other extra sounds. Abdomen:  Bowel sounds positive,abdomen soft and non-tender without masses, organomegaly or hernias noted. Msk:  No deformity or scoliosis noted of thoracic or lumbar spine.   Extremities:  No clubbing, cyanosis, edema, or deformity noted with normal full range of motion of all joints.   Neurologic:  alert & oriented X3 and gait normal.   Skin:  Intact without suspicious lesions or rashes Psych:  Cognition and judgment appear intact. Alert and cooperative with normal attention span and concentration. No apparent delusions, illusions, hallucinations

## 2014-03-24 NOTE — Assessment & Plan Note (Signed)
Congratulated her on her weight loss and encouraged her to continue working on her diet.

## 2014-03-24 NOTE — Assessment & Plan Note (Signed)
Seems to be better controlled with weight loss. She has decreased her lantus dose.  Will check a1c today.  Orders Placed This Encounter  Procedures  . Hemoglobin A1c  . Microalbumin / creatinine urine ratio  . Comprehensive metabolic panel  . HM DIABETES EYE EXAM

## 2014-04-28 ENCOUNTER — Other Ambulatory Visit: Payer: Self-pay | Admitting: *Deleted

## 2014-04-28 MED ORDER — FLUOXETINE HCL 20 MG PO CAPS: 60.0000 mg | ORAL_CAPSULE | Freq: Every day | ORAL | Status: DC

## 2014-05-12 ENCOUNTER — Other Ambulatory Visit: Payer: Self-pay | Admitting: *Deleted

## 2014-05-12 MED ORDER — LISINOPRIL 10 MG PO TABS: 10.0000 mg | ORAL_TABLET | Freq: Every day | ORAL | Status: DC

## 2014-06-22 ENCOUNTER — Other Ambulatory Visit: Payer: Self-pay

## 2014-06-22 MED ORDER — GLYBURIDE-METFORMIN 5-500 MG PO TABS: 1.0000 | ORAL_TABLET | Freq: Two times a day (BID) | ORAL | Status: DC

## 2014-06-22 NOTE — Telephone Encounter (Signed)
Pt request refill glyburide metformin to [facility] [address].advised done.

## 2014-06-23 ENCOUNTER — Telehealth: Payer: Self-pay

## 2014-06-23 NOTE — Telephone Encounter (Signed)
Pt left v/m wanting status of approval for glyburide metformin. Spoke with Theadora Rama at [address] and PA request faxed to [facility] today for glyburide metform. Pt request cb.

## 2014-06-24 NOTE — Telephone Encounter (Signed)
[facility] did not send over any information to office, they submitted info via covermymeds. Awaiting PA and message sent to Dr Deborra Medina, requesting to know if there are any alternates

## 2014-06-24 NOTE — Telephone Encounter (Signed)
Fax received indicated that pts glyburide will be covered and approved through 06/25/2015. Fax received sent for scanning.

## 2014-06-27 NOTE — Telephone Encounter (Signed)
Pt left v/m; pt request call to [phone] before 3 pm re; approval for glyburide metformin. Pt request cb today.

## 2014-06-30 ENCOUNTER — Encounter: Payer: Self-pay | Admitting: Family Medicine

## 2014-06-30 ENCOUNTER — Telehealth: Payer: Self-pay | Admitting: Family Medicine

## 2014-06-30 ENCOUNTER — Ambulatory Visit (INDEPENDENT_AMBULATORY_CARE_PROVIDER_SITE_OTHER): Payer: Medicare Other | Admitting: Family Medicine

## 2014-06-30 VITALS — BP 134/74 | HR 71 | Temp 98.2°F | Wt 228.0 lb

## 2014-06-30 DIAGNOSIS — E13319 Other specified diabetes mellitus with unspecified diabetic retinopathy without macular edema: Secondary | ICD-10-CM

## 2014-06-30 DIAGNOSIS — Z23 Encounter for immunization: Secondary | ICD-10-CM

## 2014-06-30 DIAGNOSIS — N898 Other specified noninflammatory disorders of vagina: Secondary | ICD-10-CM

## 2014-06-30 DIAGNOSIS — L298 Other pruritus: Secondary | ICD-10-CM

## 2014-06-30 LAB — COMPREHENSIVE METABOLIC PANEL
ALK PHOS: 67 U/L (ref 39–117)
ALT: 15 U/L (ref 0–35)
AST: 19 U/L (ref 0–37)
Albumin: 3.3 g/dL — ABNORMAL LOW (ref 3.5–5.2)
BILIRUBIN TOTAL: 0.4 mg/dL (ref 0.2–1.2)
BUN: 11 mg/dL (ref 6–23)
CO2: 29 meq/L (ref 19–32)
Calcium: 8.9 mg/dL (ref 8.4–10.5)
Chloride: 101 mEq/L (ref 96–112)
Creatinine, Ser: 0.8 mg/dL (ref 0.4–1.2)
GFR: 77.6 mL/min (ref >60.00)
GLUCOSE: 170 mg/dL — AB (ref 70–99)
Potassium: 4.6 mEq/L (ref 3.5–5.1)
Sodium: 135 mEq/L (ref 135–145)
Total Protein: 7.7 g/dL (ref 6.0–8.3)

## 2014-06-30 LAB — POCT URINALYSIS DIPSTICK
Bilirubin, UA: NEGATIVE
Glucose, UA: NEGATIVE
Ketones, UA: NEGATIVE
NITRITE UA: NEGATIVE
PROTEIN UA: POSITIVE
RBC UA: NEGATIVE
Spec Grav, UA: 1.015
UROBILINOGEN UA: 0.2
pH, UA: 7.5

## 2014-06-30 LAB — HEMOGLOBIN A1C: Hgb A1c MFr Bld: 6.8 % — ABNORMAL HIGH (ref 4.6–6.5)

## 2014-06-30 MED ORDER — INSULIN GLARGINE 100 UNIT/ML ~~LOC~~ SOLN: 28.0000 [IU] | Freq: Every day | SUBCUTANEOUS | Status: DC

## 2014-06-30 NOTE — Telephone Encounter (Signed)
UA pos for LE. Will await urine cx results.

## 2014-06-30 NOTE — Progress Notes (Signed)
Subjective:    Patient ID: [name], female    DOB: 1951-06-24, 63 y.o.   MRN: [id number]  Urinary Tract Infection  Associated symptoms include flank pain and frequency. Pertinent negatives include no hematuria or urgency.    63 yo pleasant female here for ? UTI.  Had some vaginal itching, burning, dysuria for past few days.  Had some left sided back pain this morning.  +/- increased urinary frequency.  Denies nausea or vomiting. No fevers. No abnormal vaginal discharge or odor. Remote h/o hysterectomy.  Working on her weight.  FSBS have improved- brings in log- fasting not above 120. Denies any episodes of hypoglycemia.  Current Outpatient Prescriptions on File Prior to Visit  Medication Sig Dispense Refill  . albuterol (PROVENTIL,VENTOLIN) 90 MCG/ACT inhaler Inhale 2 puffs into the lungs every 6 (six) hours as needed. Inhale 2 puffs using inhaler once a day as needed.  17 g  2  . busPIRone (BUSPAR) 10 MG tablet Take 1 tablet (10 mg total) by mouth at bedtime.  90 tablet  1  . FLUoxetine (PROZAC) 20 MG capsule Take 3 capsules (60 mg total) by mouth daily.  90 capsule  2  . furosemide (LASIX) 20 MG tablet Take 1 tablet (20 mg total) by mouth daily. Take one by mouth in the morning.  90 tablet  0  . glucose blood (ONE TOUCH ULTRA TEST) test strip Check blood sugar four times a day. Dx 250.70  100 each  5  . glyBURIDE-metformin (GLUCOVANCE) 5-500 MG per tablet Take 1 tablet by mouth 2 (two) times daily with a meal.  60 tablet  5  . lisinopril (PRINIVIL,ZESTRIL) 10 MG tablet Take 1 tablet (10 mg total) by mouth daily.  30 tablet  5  . lovastatin (MEVACOR) 20 MG tablet Take 1 tablet (20 mg total) by mouth at bedtime.  90 tablet  1  . ranitidine (ZANTAC) 150 MG tablet Take 1 tablet (150 mg total) by mouth 2 (two) times daily.  180 tablet  0   No current facility-administered medications on file prior to visit.    Allergies  Allergen Reactions  . Aspirin     unknown  .  Codeine     REACTION: UNSPECIFIED    Past Medical History  Diagnosis Date  . Chronic diastolic heart failure   . Congestive heart failure, unspecified   . Other dyspnea and respiratory abnormality   . Essential hypertension, benign   . Other and unspecified hyperlipidemia   . Type II or unspecified type diabetes mellitus with peripheral circulatory disorders, not stated as uncontrolled(250.70)   . Anxiety state, unspecified   . Depressive disorder, not elsewhere classified   . Unspecified asthma, with exacerbation   . Recent retinal detachment, partial, with giant tear   . Anemia, unspecified   . Wheezing   . Acute upper respiratory infections of unspecified site   . Contact dermatitis and other eczema, due to unspecified cause   . Other diseases of lung, not elsewhere classified   . Unspecified urinary incontinence   . Cellulitis   . Mastoiditis   . Diabetes mellitus     Past Surgical History  Procedure Laterality Date  . Knee surgery  2003    meniscus  . Eye surgery      Left retina repair- 11/09, 02-20-09  . Laparoscopic gastric banding  08/21/10  . Abdominal hysterectomy    . Dilation and curettage of uterus      Family History  Problem Relation Age of Onset  . Cancer Mother     breast  . Cancer Sister     breast  . Cancer Maternal Aunt     breast  . Cancer Maternal Uncle     breast  . Cancer Maternal Grandmother     breast    History   Social History  . Marital Status: Married    Spouse Name: N/A    Number of Children: N/A  . Years of Education: N/A   Occupational History  . School bus driver    Social History Main Topics  . Smoking status: Never Smoker   . Smokeless tobacco: Never Used  . Alcohol Use: No  . Drug Use: No  . Sexual Activity: Not on file   Other Topics Concern  . Not on file   Social History Narrative  . No narrative on file   The PMH, PSH, Social History, Family History, Medications, and allergies have been reviewed in [facility],  and have been updated if relevant.  Review of Systems  Constitutional: Negative for fever.  Genitourinary: Positive for dysuria, frequency, flank pain and vaginal pain. Negative for urgency, hematuria, vaginal bleeding, vaginal discharge, difficulty urinating and dyspareunia.  All other systems reviewed and are negative.      Objective:   Physical Exam  Nursing note and vitals reviewed. Constitutional: She appears well-developed and well-nourished. No distress.  HENT:  Head: Normocephalic.  Abdominal: Soft. Bowel sounds are normal. She exhibits no distension and no mass. There is no tenderness. There is no rebound and no guarding.  Musculoskeletal:  No CVA tenderness  Psychiatric: She has a normal mood and affect. Her behavior is normal. Judgment and thought content normal.   BP 134/74  Pulse 71  Temp(Src) 98.2 F (36.8 C) (Oral)  Wt 228 lb (103.42 kg)  SpO2 97% Wt Readings from Last 3 Encounters:  06/30/14 228 lb (103.42 kg)  03/24/14 228 lb 8 oz (103.647 kg)  11/08/13 236 lb 4 oz (107.162 kg)         Assessment & Plan:

## 2014-06-30 NOTE — Assessment & Plan Note (Signed)
Improved. Recheck labs today. Orders Placed This Encounter  Procedures  . Flu Vaccine QUAD 36+ mos PF IM (Fluarix Quad PF)  . Hemoglobin A1c  . Comprehensive metabolic panel  . Urinalysis Dipstick

## 2014-06-30 NOTE — Assessment & Plan Note (Signed)
New- she preferred not to have pelvic exam today.  Will check UA, if neg, will advised OTC monistat. Call or return to clinic prn if these symptoms worsen or fail to improve as anticipated. The patient indicates understanding of these issues and agrees with the plan.

## 2014-06-30 NOTE — Progress Notes (Signed)
Pre visit review using our clinic review tool, if applicable. No additional management support is needed unless otherwise documented below in the visit note. 

## 2014-06-30 NOTE — Addendum Note (Signed)
Addended by: Modena Nunnery on: 06/30/2014 12:49 PM   Modules accepted: Orders

## 2014-07-01 ENCOUNTER — Telehealth: Payer: Self-pay

## 2014-07-01 ENCOUNTER — Encounter: Payer: Self-pay | Admitting: *Deleted

## 2014-07-01 LAB — URINE CULTURE
COLONY COUNT: NO GROWTH
Organism ID, Bacteria: NO GROWTH

## 2014-07-01 NOTE — Telephone Encounter (Signed)
Attempted to contact pt; line busy. Letter mailed to pt indicating results.

## 2014-07-01 NOTE — Telephone Encounter (Signed)
Pt left v/m requesting cb when gets urine culture results and A1C test.

## 2014-07-04 ENCOUNTER — Telehealth: Payer: Self-pay

## 2014-07-04 NOTE — Telephone Encounter (Signed)
Pt advised of results per Mearl Latin, see lab results

## 2014-07-04 NOTE — Telephone Encounter (Signed)
Pt left v/m requesting cb about urine culture results.

## 2014-07-04 NOTE — Telephone Encounter (Signed)
The culture showed no bacteria.  How is she feeling?

## 2014-07-12 ENCOUNTER — Other Ambulatory Visit: Payer: Self-pay | Admitting: *Deleted

## 2014-07-12 MED ORDER — INSULIN GLARGINE 100 UNIT/ML ~~LOC~~ SOLN: 28.0000 [IU] | Freq: Every day | SUBCUTANEOUS | Status: DC

## 2014-07-14 ENCOUNTER — Telehealth: Payer: Self-pay

## 2014-07-14 NOTE — Telephone Encounter (Signed)
Pharmacist with [facility] left v/m requesting cb [phone] re; to glyburide metformin is high risk for pt's age group glyburide metformin has long half life that can cause prolonged hyperglycemia. Pharmacist request cb to discuss other med alternatives.

## 2014-07-14 NOTE — Telephone Encounter (Signed)
To pcp

## 2014-07-15 NOTE — Telephone Encounter (Signed)
Please call pharmacist and ask what alternatives he or she suggests.

## 2014-07-18 ENCOUNTER — Other Ambulatory Visit: Payer: Self-pay | Admitting: *Deleted

## 2014-07-18 MED ORDER — FUROSEMIDE 20 MG PO TABS: 20.0000 mg | ORAL_TABLET | Freq: Every day | ORAL | Status: DC

## 2014-07-18 NOTE — Telephone Encounter (Signed)
Spoke to pt who states that the medication has been approved for a "few months" and she has picked up Rx from pharmacy

## 2014-08-01 ENCOUNTER — Telehealth: Payer: Self-pay

## 2014-08-01 NOTE — Telephone Encounter (Signed)
Pt left v/m requesting cb about her med refills; no name of refills noted; left v/m requesting cb.

## 2014-08-03 ENCOUNTER — Other Ambulatory Visit: Payer: Self-pay

## 2014-08-03 MED ORDER — FLUOXETINE HCL 20 MG PO CAPS: 60.0000 mg | ORAL_CAPSULE | Freq: Every day | ORAL | Status: DC

## 2014-08-03 NOTE — Telephone Encounter (Signed)
See 08/03/14 phone note.

## 2014-08-03 NOTE — Telephone Encounter (Signed)
Pt called requesting refills on glyburide metformin,lantus and fluoxetine. Margarita Grizzle at [address] said pt has available refills for glyburide metformin and lantus. Fluoxetine sent to [facility] [address]. Pt will cb and schedule 3 month f/u with Dr Deborra Medina. Pt voiced understanding on status of refills.

## 2014-08-05 ENCOUNTER — Emergency Department (HOSPITAL_COMMUNITY): Payer: Medicare Other

## 2014-08-05 ENCOUNTER — Emergency Department (HOSPITAL_COMMUNITY)
Admission: EM | Admit: 2014-08-05 | Discharge: 2014-08-05 | Disposition: A | Discharge status: Home / Self Care | Payer: Medicare Other | Attending: Emergency Medicine | Admitting: Emergency Medicine

## 2014-08-05 ENCOUNTER — Encounter (HOSPITAL_COMMUNITY): Payer: Self-pay | Admitting: *Deleted

## 2014-08-05 ENCOUNTER — Telehealth: Payer: Self-pay

## 2014-08-05 DIAGNOSIS — J45901 Unspecified asthma with (acute) exacerbation: Secondary | ICD-10-CM | POA: Insufficient documentation

## 2014-08-05 DIAGNOSIS — F329 Major depressive disorder, single episode, unspecified: Secondary | ICD-10-CM | POA: Diagnosis not present

## 2014-08-05 DIAGNOSIS — Z87448 Personal history of other diseases of urinary system: Secondary | ICD-10-CM | POA: Diagnosis not present

## 2014-08-05 DIAGNOSIS — Z862 Personal history of diseases of the blood and blood-forming organs and certain disorders involving the immune mechanism: Secondary | ICD-10-CM | POA: Diagnosis not present

## 2014-08-05 DIAGNOSIS — Z79899 Other long term (current) drug therapy: Secondary | ICD-10-CM | POA: Diagnosis not present

## 2014-08-05 DIAGNOSIS — R42 Dizziness and giddiness: Secondary | ICD-10-CM

## 2014-08-05 DIAGNOSIS — R079 Chest pain, unspecified: Secondary | ICD-10-CM | POA: Insufficient documentation

## 2014-08-05 DIAGNOSIS — I10 Essential (primary) hypertension: Secondary | ICD-10-CM | POA: Diagnosis not present

## 2014-08-05 DIAGNOSIS — F419 Anxiety disorder, unspecified: Secondary | ICD-10-CM | POA: Diagnosis not present

## 2014-08-05 DIAGNOSIS — Z794 Long term (current) use of insulin: Secondary | ICD-10-CM | POA: Diagnosis not present

## 2014-08-05 DIAGNOSIS — E119 Type 2 diabetes mellitus without complications: Secondary | ICD-10-CM | POA: Insufficient documentation

## 2014-08-05 DIAGNOSIS — I5032 Chronic diastolic (congestive) heart failure: Secondary | ICD-10-CM | POA: Insufficient documentation

## 2014-08-05 LAB — CBC
HEMATOCRIT: 33.8 % — AB (ref 36.0–46.0)
Hemoglobin: 10.9 g/dL — ABNORMAL LOW (ref 12.0–15.0)
MCH: 25.3 pg — ABNORMAL LOW (ref 26.0–34.0)
MCHC: 32.2 g/dL (ref 30.0–36.0)
MCV: 78.6 fL (ref 78.0–100.0)
Platelets: 303 10*3/uL (ref 150–400)
RBC: 4.3 MIL/uL (ref 3.87–5.11)
RDW: 13.7 % (ref 11.5–15.5)
WBC: 6.1 10*3/uL (ref 4.0–10.5)

## 2014-08-05 LAB — BASIC METABOLIC PANEL
Anion gap: 12 (ref 5–15)
BUN: 11 mg/dL (ref 6–23)
CHLORIDE: 99 meq/L (ref 96–112)
CO2: 26 mEq/L (ref 19–32)
Calcium: 9.3 mg/dL (ref 8.4–10.5)
Creatinine, Ser: 0.64 mg/dL (ref 0.50–1.10)
GFR calc non Af Amer: >90 mL/min (ref >90)
GLUCOSE: 90 mg/dL (ref 70–99)
Potassium: 4.4 mEq/L (ref 3.7–5.3)
Sodium: 137 mEq/L (ref 137–147)

## 2014-08-05 LAB — URINE MICROSCOPIC-ADD ON

## 2014-08-05 LAB — URINALYSIS, ROUTINE W REFLEX MICROSCOPIC
BILIRUBIN URINE: NEGATIVE
Glucose, UA: NEGATIVE mg/dL
Hgb urine dipstick: NEGATIVE
Ketones, ur: NEGATIVE mg/dL
NITRITE: NEGATIVE
PH: 5.5 (ref 5.0–8.0)
Protein, ur: NEGATIVE mg/dL
Specific Gravity, Urine: 1.015 (ref 1.005–1.030)
UROBILINOGEN UA: 0.2 mg/dL (ref 0.0–1.0)

## 2014-08-05 LAB — PRO B NATRIURETIC PEPTIDE: Pro B Natriuretic peptide (BNP): 46.9 pg/mL (ref 0–125)

## 2014-08-05 LAB — I-STAT TROPONIN, ED: TROPONIN I, POC: 0 ng/mL (ref 0.00–0.08)

## 2014-08-05 MED ORDER — MECLIZINE HCL 25 MG PO TABS: 25.0000 mg | ORAL_TABLET | Freq: Three times a day (TID) | ORAL | Status: DC | PRN

## 2014-08-05 MED ORDER — MECLIZINE HCL 25 MG PO TABS
25.0000 mg | ORAL_TABLET | Freq: Once | ORAL | Status: AC
  Administered 2014-08-05: 25 mg via ORAL
  Filled 2014-08-05: qty 1

## 2014-08-05 NOTE — Telephone Encounter (Signed)
Pt walked in to schedule appt with Dr Deborra Medina for loss of balance; cannot walk straight pt thinks her body pulls to the left; slight h/a on rt side of head today, symptoms started on 08/04/14 and continuing today; N&V a 1 on 08/04/14; no N&V today.Pt was seated in w/c. Pt has hx of inner ear problem but pt said this is not like previous inner ear episodes. Dr Glori Bickers advised pt should go to ED for eval for possible CT scan if needed. Pt voiced understanding and pts husband will take her to [facility] ED now. While waiting on pt's husband to bring up car; pt said she did have quick jabby CP on 08/03/14. No CP since then. Pt did not admit to CP on 08/03/14 when triaged because she did not want to upset her husband. Pt will let ED know she did have CP on 08/03/14.

## 2014-08-05 NOTE — Telephone Encounter (Signed)
Worrisome symptoms- sent to ER

## 2014-08-05 NOTE — Discharge Instructions (Signed)
Benign Positional Vertigo Vertigo means you feel like you or your surroundings are moving when they are not. Benign positional vertigo is the most common form of vertigo. Benign means that the cause of your condition is not serious. Benign positional vertigo is more common in older adults. CAUSES  Benign positional vertigo is the result of an upset in the labyrinth system. This is an area in the middle ear that helps control your balance. This may be caused by a viral infection, head injury, or repetitive motion. However, often no specific cause is found. SYMPTOMS  Symptoms of benign positional vertigo occur when you move your head or eyes in different directions. Some of the symptoms may include:  Loss of balance and falls.  Vomiting.  Blurred vision.  Dizziness.  Nausea.  Involuntary eye movements (nystagmus). DIAGNOSIS  Benign positional vertigo is usually diagnosed by physical exam. If the specific cause of your benign positional vertigo is unknown, your caregiver may perform imaging tests, such as magnetic resonance imaging (MRI) or computed tomography (CT). TREATMENT  Your caregiver may recommend movements or procedures to correct the benign positional vertigo. Medicines such as meclizine, benzodiazepines, and medicines for nausea may be used to treat your symptoms. In rare cases, if your symptoms are caused by certain conditions that affect the inner ear, you may need surgery. HOME CARE INSTRUCTIONS   Follow your caregiver's instructions.  Move slowly. Do not make sudden body or head movements.  Avoid driving.  Avoid operating heavy machinery.  Avoid performing any tasks that would be dangerous to you or others during a vertigo episode.  Drink enough fluids to keep your urine clear or pale yellow. SEEK IMMEDIATE MEDICAL CARE IF:   You develop problems with walking, weakness, numbness, or using your arms, hands, or legs.  You have difficulty speaking.  You develop  severe headaches.  Your nausea or vomiting continues or gets worse.  You develop visual changes.  Your family or friends notice any behavioral changes.  Your condition gets worse.  You have a fever.  You develop a stiff neck or sensitivity to light. MAKE SURE YOU:   Understand these instructions.  Will watch your condition.  Will get help right away if you are not doing well or get worse. Document Released: 06/10/2006 Document Revised: 11/25/2011 Document Reviewed: 05/23/2011 Exit[employer] Patient Information 2015 [employer]. This information is not intended to replace advice given to you by your health care provider. Make sure you discuss any questions you have with your health care provider.    

## 2014-08-05 NOTE — ED Provider Notes (Signed)
CSN: [id number]     Arrival date & time 08/05/14  1143 History   First MD Initiated Contact with Patient 08/05/14 1222     Chief Complaint  Patient presents with  . Dizziness  . Chest Pain     (Consider location/radiation/quality/duration/timing/severity/associated sxs/prior Treatment) Patient is a 63 y.o. female presenting with dizziness and chest pain. The history is provided by the patient.  Dizziness Quality:  Head spinning and imbalance Severity:  Moderate Onset quality:  Sudden Duration:  2 days Timing:  Constant Progression:  Unchanged Chronicity:  New Context comment:  Started when got out of bed yesterday Relieved by:  Nothing Worsened by:  Movement and standing up (bending over) Associated symptoms: chest pain, headaches and nausea   Associated symptoms: no shortness of breath, no syncope, no tinnitus, no vomiting and no weakness   Risk factors: hx of vertigo   Risk factors: no hx of stroke and no new medications   Chest Pain Associated symptoms: dizziness, headache and nausea   Associated symptoms: no shortness of breath, no syncope and not vomiting     Past Medical History  Diagnosis Date  . Chronic diastolic heart failure   . Congestive heart failure, unspecified   . Other dyspnea and respiratory abnormality   . Essential hypertension, benign   . Other and unspecified hyperlipidemia   . Type II or unspecified type diabetes mellitus with peripheral circulatory disorders, not stated as uncontrolled(250.70)   . Anxiety state, unspecified   . Depressive disorder, not elsewhere classified   . Unspecified asthma, with exacerbation   . Recent retinal detachment, partial, with giant tear   . Anemia, unspecified   . Wheezing   . Acute upper respiratory infections of unspecified site   . Contact dermatitis and other eczema, due to unspecified cause   . Other diseases of lung, not elsewhere classified   . Unspecified urinary incontinence   . Cellulitis   .  Mastoiditis   . Diabetes mellitus    Past Surgical History  Procedure Laterality Date  . Knee surgery  2003    meniscus  . Eye surgery      Left retina repair- 11/09, 02-20-09  . Laparoscopic gastric banding  08/21/10  . Abdominal hysterectomy    . Dilation and curettage of uterus     Family History  Problem Relation Age of Onset  . Cancer Mother     breast  . Cancer Sister     breast  . Cancer Maternal Aunt     breast  . Cancer Maternal Uncle     breast  . Cancer Maternal Grandmother     breast   History  Substance Use Topics  . Smoking status: Never Smoker   . Smokeless tobacco: Never Used  . Alcohol Use: No   OB History    No data available     Review of Systems  HENT: Negative for tinnitus.   Respiratory: Negative for shortness of breath.   Cardiovascular: Positive for chest pain. Negative for syncope.  Gastrointestinal: Positive for nausea. Negative for vomiting.  Neurological: Positive for dizziness and headaches.  All other systems reviewed and are negative.     Allergies  Aspirin and Codeine  Home Medications   Prior to Admission medications   Medication Sig Start Date End Date Taking? Authorizing Provider  albuterol (PROVENTIL,VENTOLIN) 90 MCG/ACT inhaler Inhale 2 puffs into the lungs every 6 (six) hours as needed. Inhale 2 puffs using inhaler once a day as needed. 09/20/11  Yes Lucille Passy, MD  busPIRone (BUSPAR) 10 MG tablet Take 1 tablet (10 mg total) by mouth at bedtime. 03/24/14  Yes Lucille Passy, MD  FLUoxetine (PROZAC) 20 MG capsule Take 3 capsules (60 mg total) by mouth daily. 08/03/14  Yes Lucille Passy, MD  furosemide (LASIX) 20 MG tablet Take 1 tablet (20 mg total) by mouth daily. Take one by mouth in the morning. 07/18/14  Yes Lucille Passy, MD  glucose blood (ONE TOUCH ULTRA TEST) test strip Check blood sugar four times a day. Dx 250.70 08/27/12  Yes Lucille Passy, MD  glyBURIDE-metformin (GLUCOVANCE) 5-500 MG per tablet Take 1 tablet by mouth 2  (two) times daily with a meal. 06/22/14  Yes Lucille Passy, MD  insulin glargine (LANTUS) 100 UNIT/ML injection Inject 0.28 mLs (28 Units total) into the skin at bedtime. Patient taking differently: Inject 25 Units into the skin at bedtime.  07/12/14  Yes Lucille Passy, MD  lisinopril (PRINIVIL,ZESTRIL) 10 MG tablet Take 1 tablet (10 mg total) by mouth daily. 05/12/14  Yes Lucille Passy, MD  lovastatin (MEVACOR) 20 MG tablet Take 1 tablet (20 mg total) by mouth at bedtime. 12/27/13  Yes Lucille Passy, MD  Propylene Glycol (SYSTANE BALANCE OP) Place 1 drop into the right eye every hour. For dry eye   Yes Historical Provider, MD  pseudoephedrine (SUDAFED) 30 MG tablet Take 30 mg by mouth once.   Yes Historical Provider, MD  ranitidine (ZANTAC) 150 MG tablet Take 1 tablet (150 mg total) by mouth 2 (two) times daily. 03/24/14  Yes Lucille Passy, MD   BP 108/48 mmHg  Pulse 77  Temp(Src) 98.2 F (36.8 C) (Oral)  Resp 17  SpO2 98% Physical Exam  Constitutional: She is oriented to person, place, and time. She appears well-developed and well-nourished. No distress.  HENT:  Head: Normocephalic and atraumatic.  Mouth/Throat: Oropharynx is clear and moist.  Eyes: Conjunctivae and EOM are normal. Pupils are equal, round, and reactive to light.  Neck: Normal range of motion. Neck supple.  Cardiovascular: Normal rate, regular rhythm and intact distal pulses.   Murmur heard.  Systolic murmur is present with a grade of 2/6  Heard best at LSB and radiated to bilateral carotids  Pulmonary/Chest: Effort normal and breath sounds normal. No respiratory distress. She has no wheezes. She has no rales.  Abdominal: Soft. She exhibits no distension. There is no tenderness. There is no rebound and no guarding.  Musculoskeletal: Normal range of motion. She exhibits no edema or tenderness.  Neurological: She is alert and oriented to person, place, and time. She has normal strength. No cranial nerve deficit or sensory deficit.  Coordination normal.  Normal heel to shin and normal finger to nose.  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  Pt walks without ataxia   Skin: Skin is warm and dry. No rash noted. No erythema.  Psychiatric: She has a normal mood and affect. Her behavior is normal.  Nursing note and vitals reviewed.   ED Course  Procedures (including critical care time) Labs Review Labs Reviewed  CBC - Abnormal; Notable for the following:    Hemoglobin 10.9 (*)    HCT 33.8 (*)    MCH 25.3 (*)    All other components within normal limits  BASIC METABOLIC PANEL  PRO B NATRIURETIC PEPTIDE  URINALYSIS, ROUTINE W REFLEX MICROSCOPIC  I-STAT TROPOININ, ED    Imaging Review Dg Chest 2 View  08/05/2014   CLINICAL DATA:  Initial encounter for today history of dizziness  EXAM: CHEST  2 VIEW  COMPARISON:  10/20/2013  FINDINGS: The heart size and mediastinal contours are within normal limits. Both lungs are clear. The visualized skeletal structures are unremarkable.  IMPRESSION: No active cardiopulmonary disease.   Electronically Signed   By: Misty Stanley M.D.   On: 08/05/2014 12:33   Mr Brain Wo Contrast  08/05/2014   CLINICAL DATA:  63 year old female with sudden onset of dizziness headache nausea and mild chest pains. Initial encounter.  EXAM: MRI HEAD WITHOUT CONTRAST  TECHNIQUE: Multiplanar, multiecho pulse sequences of the brain and surrounding structures were obtained without intravenous contrast.  COMPARISON:  Head CT without contrast 02/05/2011.  FINDINGS: Cerebral volume is normal. No restricted diffusion to suggest acute infarction. No midline shift, mass effect, evidence of mass lesion, ventriculomegaly, extra-axial collection or acute intracranial hemorrhage. Cervicomedullary junction and pituitary are within normal limits. Negative visualized cervical spine. Major intracranial vascular flow voids are preserved.  Visible internal auditory structures appear normal. There is a small chronic micro  hemorrhage in the left cerebellum on series 9, image 37 near the dentate nucleus. There may be occasional chronic micro hemorrhages also in the right temporal lobe seen on series 9, image 68. Otherwise normal gray and white matter signal throughout the brain.  Mastoids are clear. Postoperative changes to both globes. Mild paranasal sinus mucosal thickening. Visualized scalp soft tissues are within normal limits. Normal bone marrow signal.  IMPRESSION: 1.  No acute intracranial abnormality. 2. Several chronic microhemorrhages, often due to small vessel disease or could be posttraumatic in nature. 3. Otherwise negative noncontrast MRI appearance of the brain.   Electronically Signed   By: Lars Pinks M.D.   On: 08/05/2014 15:00     EKG Interpretation   Date/Time:  Friday August 05 2014 12:00:33 EST Ventricular Rate:  68 PR Interval:  182 QRS Duration: 70 QT Interval:  402 QTC Calculation: 427 R Axis:   29 Text Interpretation:  Normal sinus rhythm Normal ECG No significant change  since last tracing Confirmed by Maryan Rued  MD, Loree Fee ([id number]) on  08/05/2014 12:21:29 PM      MDM   Final diagnoses:  Dizziness    Patient complaining of dizziness that started yesterday. She also describes difficulty walking and causes mild nausea. The dizziness she describes as her body moving but feels different than her inner ear symptoms she's had in the past.  On exam patient has no focal neurologic deficits. She is otherwise well-appearing. She does complain of fast twitches in the chest which last no more than 1-2 seconds been going on since the symptoms started. However this is not sound cardiac in nature she has no prior cardiac history except for transient CHF years ago. Low suspicion that she has any cardiac involvement with symptoms today. She does not have orthostatic hypotension.  Concern for possible cerebellar stroke. MRI pending for further evaluation. EKG unchanged. Chest x-ray within normal  limits. Labs are all within normal limits  3:21 PM MRI neg for acute issues.  Microhemorrhage discussed with neuro who states this does not need further eval and is from hypertension and not unusual.  Family states last time pt had sx like this it was related to UTI.  Will get urine and walk pt.  Blanchie Dessert, MD 08/05/14 [phone]

## 2014-08-05 NOTE — ED Notes (Signed)
Pt reports onset yesterday of dizziness, headache, nausea and mild chest pains that she describes as "twitching." airway intact at triage, ekg done.

## 2014-08-05 NOTE — ED Provider Notes (Signed)
I was asked by my partner Dr. Maryan Rued to evaluate the patient's urinalysis. No minimal white blood cells. Nitrate negative. She is asymptomatic. No dysuria frequency or hematuria. No additional dizziness. She received symptom improvement here with Antivert. Plan will be discharged home with Antivert. Vertigo precautions. Medications until 24 hours without symptoms. No driving until 24 hours without symptoms.  Tanna Furry, MD 08/05/14 [phone]

## 2014-08-09 ENCOUNTER — Ambulatory Visit (INDEPENDENT_AMBULATORY_CARE_PROVIDER_SITE_OTHER): Payer: Medicare Other | Admitting: Family Medicine

## 2014-08-09 ENCOUNTER — Encounter: Payer: Self-pay | Admitting: Family Medicine

## 2014-08-09 VITALS — BP 116/60 | HR 79 | Temp 97.9°F | Wt 222.2 lb

## 2014-08-09 DIAGNOSIS — R42 Dizziness and giddiness: Secondary | ICD-10-CM

## 2014-08-09 NOTE — Progress Notes (Signed)
Pre visit review using our clinic review tool, if applicable. No additional management support is needed unless otherwise documented below in the visit note. 

## 2014-08-09 NOTE — Progress Notes (Signed)
Subjective:   Patient ID: [name], female    DOB: October 09, 1950, 63 y.o.   MRN: [id number]  [name] is a pleasant 63 y.o. year old female who presents to clinic today with Dizziness and Hospitalization Follow-up  on 08/09/2014  HPI: Here for ER follow up. Notes reviewed- went to [facility] on 11/20 for chest pain, sensation room was spinning with nausea and HA.  MRI neg for acute issues. Dg Chest 2 View  08/05/2014   CLINICAL DATA:  Initial encounter for today history of dizziness  EXAM: CHEST  2 VIEW  COMPARISON:  10/20/2013  FINDINGS: The heart size and mediastinal contours are within normal limits. Both lungs are clear. The visualized skeletal structures are unremarkable.  IMPRESSION: No active cardiopulmonary disease.   Electronically Signed   By: Misty Stanley M.D.   On: 08/05/2014 12:33   Mr Brain Wo Contrast  08/05/2014   CLINICAL DATA:  63 year old female with sudden onset of dizziness headache nausea and mild chest pains. Initial encounter.  EXAM: MRI HEAD WITHOUT CONTRAST  TECHNIQUE: Multiplanar, multiecho pulse sequences of the brain and surrounding structures were obtained without intravenous contrast.  COMPARISON:  Head CT without contrast 02/05/2011.  FINDINGS: Cerebral volume is normal. No restricted diffusion to suggest acute infarction. No midline shift, mass effect, evidence of mass lesion, ventriculomegaly, extra-axial collection or acute intracranial hemorrhage. Cervicomedullary junction and pituitary are within normal limits. Negative visualized cervical spine. Major intracranial vascular flow voids are preserved.  Visible internal auditory structures appear normal. There is a small chronic micro hemorrhage in the left cerebellum on series 9, image 37 near the dentate nucleus. There may be occasional chronic micro hemorrhages also in the right temporal lobe seen on series 9, image 68. Otherwise normal gray and white matter signal throughout the brain.  Mastoids are  clear. Postoperative changes to both globes. Mild paranasal sinus mucosal thickening. Visualized scalp soft tissues are within normal limits. Normal bone marrow signal.  IMPRESSION: 1.  No acute intracranial abnormality. 2. Several chronic microhemorrhages, often due to small vessel disease or could be posttraumatic in nature. 3. Otherwise negative noncontrast MRI appearance of the brain.   Electronically Signed   By: Lars Pinks M.D.   On: 08/05/2014 15:00   Lab Results  Component Value Date   WBC 6.1 08/05/2014   HGB 10.9* 08/05/2014   HCT 33.8* 08/05/2014   MCV 78.6 08/05/2014   PLT 303 08/05/2014   Lab Results  Component Value Date   CREATININE 0.64 08/05/2014    UA neg.  Given antivert and symptoms improved.  D/c'd home and advised to follow up with me here today. Feels she still has symptoms but improving- feels she cannot walk straight, symptoms worse when she changes head positions quickly. +nausea and vomiting that have resolved. Current Outpatient Prescriptions on File Prior to Visit  Medication Sig Dispense Refill  . albuterol (PROVENTIL,VENTOLIN) 90 MCG/ACT inhaler Inhale 2 puffs into the lungs every 6 (six) hours as needed. Inhale 2 puffs using inhaler once a day as needed. 17 g 2  . busPIRone (BUSPAR) 10 MG tablet Take 1 tablet (10 mg total) by mouth at bedtime. 90 tablet 1  . FLUoxetine (PROZAC) 20 MG capsule Take 3 capsules (60 mg total) by mouth daily. 90 capsule 1  . furosemide (LASIX) 20 MG tablet Take 1 tablet (20 mg total) by mouth daily. Take one by mouth in the morning. 90 tablet 0  . glucose blood (ONE TOUCH ULTRA  TEST) test strip Check blood sugar four times a day. Dx 250.70 100 each 5  . glyBURIDE-metformin (GLUCOVANCE) 5-500 MG per tablet Take 1 tablet by mouth 2 (two) times daily with a meal. 60 tablet 5  . insulin glargine (LANTUS) 100 UNIT/ML injection Inject 0.28 mLs (28 Units total) into the skin at bedtime. (Patient taking differently: Inject 25 Units into  the skin at bedtime. ) 10 mL 5  . lisinopril (PRINIVIL,ZESTRIL) 10 MG tablet Take 1 tablet (10 mg total) by mouth daily. 30 tablet 5  . lovastatin (MEVACOR) 20 MG tablet Take 1 tablet (20 mg total) by mouth at bedtime. 90 tablet 1  . meclizine (ANTIVERT) 25 MG tablet Take 1 tablet (25 mg total) by mouth 3 (three) times daily as needed. 20 tablet 0  . Propylene Glycol (SYSTANE BALANCE OP) Place 1 drop into the right eye every hour. For dry eye    . pseudoephedrine (SUDAFED) 30 MG tablet Take 30 mg by mouth once.    . ranitidine (ZANTAC) 150 MG tablet Take 1 tablet (150 mg total) by mouth 2 (two) times daily. 180 tablet 0   No current facility-administered medications on file prior to visit.    Allergies  Allergen Reactions  . Aspirin Other (See Comments)    asthma  . Codeine Nausea And Vomiting    Past Medical History  Diagnosis Date  . Chronic diastolic heart failure   . Congestive heart failure, unspecified   . Other dyspnea and respiratory abnormality   . Essential hypertension, benign   . Other and unspecified hyperlipidemia   . Type II or unspecified type diabetes mellitus with peripheral circulatory disorders, not stated as uncontrolled(250.70)   . Anxiety state, unspecified   . Depressive disorder, not elsewhere classified   . Unspecified asthma, with exacerbation   . Recent retinal detachment, partial, with giant tear   . Anemia, unspecified   . Wheezing   . Acute upper respiratory infections of unspecified site   . Contact dermatitis and other eczema, due to unspecified cause   . Other diseases of lung, not elsewhere classified   . Unspecified urinary incontinence   . Cellulitis   . Mastoiditis   . Diabetes mellitus     Past Surgical History  Procedure Laterality Date  . Knee surgery  2003    meniscus  . Eye surgery      Left retina repair- 11/09, 02-20-09  . Laparoscopic gastric banding  08/21/10  . Abdominal hysterectomy    . Dilation and curettage of uterus        Family History  Problem Relation Age of Onset  . Cancer Mother     breast  . Cancer Sister     breast  . Cancer Maternal Aunt     breast  . Cancer Maternal Uncle     breast  . Cancer Maternal Grandmother     breast    History   Social History  . Marital Status: Married    Spouse Name: N/A    Number of Children: N/A  . Years of Education: N/A   Occupational History  . School bus driver    Social History Main Topics  . Smoking status: Never Smoker   . Smokeless tobacco: Never Used  . Alcohol Use: No  . Drug Use: No  . Sexual Activity: Not on file   Other Topics Concern  . Not on file   Social History Narrative   The PMH, PSH, Social History, Family History,  Medications, and allergies have been reviewed in [facility], and have been updated if relevant.  Review of Systems  Constitutional: Negative.   HENT: Negative for tinnitus.   Neurological: Positive for dizziness. Negative for tremors, seizures, syncope, speech difficulty and headaches.  Hematological: Negative.   Psychiatric/Behavioral: Negative.   All other systems reviewed and are negative.      Objective:    BP 116/60 mmHg  Pulse 79  Temp(Src) 97.9 F (36.6 C) (Oral)  Wt 222 lb 4 oz (100.812 kg)  SpO2 98%  Wt Readings from Last 3 Encounters:  08/09/14 222 lb 4 oz (100.812 kg)  06/30/14 228 lb (103.42 kg)  03/24/14 228 lb 8 oz (103.647 kg)    Physical Exam  Constitutional: She is oriented to person, place, and time. She appears well-developed and well-nourished. No distress.  HENT:  Head: Normocephalic.  Eyes: Pupils are equal, round, and reactive to light.  Musculoskeletal: She exhibits no edema.  Neurological: She is alert and oriented to person, place, and time. Coordination normal.  Skin: Skin is warm and dry.  Psychiatric: She has a normal mood and affect. Thought content normal.          Assessment & Plan:   Dizziness and giddiness No Follow-up on file.

## 2014-08-09 NOTE — Assessment & Plan Note (Signed)
New- improving. Consistent with BPV. Advised continued supportive care/prn antivert. If symptoms worsen, refer for vestibular rehab. The patient indicates understanding of these issues and agrees with the plan.

## 2014-08-28 ENCOUNTER — Emergency Department: Payer: Self-pay | Admitting: Emergency Medicine

## 2014-10-04 ENCOUNTER — Telehealth: Payer: Self-pay

## 2014-10-04 NOTE — Telephone Encounter (Signed)
Pt left v/m; ins will not approve glyburide-metformin; pt request another prescription so pt can get diabetic med. Pt last seen 08/09/14 for hospital f/u.Not sure if pt wants rx for glyburide-metformin or if pt wants substitute med. Left v/m requesting pt to cb. To clarify.

## 2014-10-05 NOTE — Telephone Encounter (Signed)
Mr Dobbins left v/m that ins will no longer cover glyburide-metformin and request substitute med sent to [facility]. Spoke with Mrs Sortino and she will contact ins co to find out what substitute med would be covered and pt will cb.

## 2014-10-11 ENCOUNTER — Other Ambulatory Visit: Payer: Self-pay | Admitting: *Deleted

## 2014-10-11 MED ORDER — FLUOXETINE HCL 20 MG PO CAPS: 60.0000 mg | ORAL_CAPSULE | Freq: Every day | ORAL | Status: DC

## 2014-10-17 ENCOUNTER — Other Ambulatory Visit: Payer: Self-pay | Admitting: *Deleted

## 2014-10-17 MED ORDER — FUROSEMIDE 20 MG PO TABS: 20.0000 mg | ORAL_TABLET | Freq: Every day | ORAL | Status: DC

## 2014-10-17 MED ORDER — BUSPIRONE HCL 10 MG PO TABS: 10.0000 mg | ORAL_TABLET | Freq: Every day | ORAL | Status: DC

## 2014-11-14 ENCOUNTER — Other Ambulatory Visit: Payer: Self-pay | Admitting: *Deleted

## 2014-11-14 MED ORDER — RANITIDINE HCL 150 MG PO TABS: 150.0000 mg | ORAL_TABLET | Freq: Two times a day (BID) | ORAL | Status: DC

## 2014-11-21 ENCOUNTER — Other Ambulatory Visit: Payer: Self-pay | Admitting: *Deleted

## 2014-11-21 MED ORDER — LOVASTATIN 20 MG PO TABS: 20.0000 mg | ORAL_TABLET | Freq: Every day | ORAL | Status: DC

## 2014-11-23 ENCOUNTER — Other Ambulatory Visit: Payer: Self-pay | Admitting: *Deleted

## 2014-11-23 MED ORDER — LISINOPRIL 10 MG PO TABS: 10.0000 mg | ORAL_TABLET | Freq: Every day | ORAL | Status: DC

## 2014-11-24 ENCOUNTER — Encounter: Payer: Self-pay | Admitting: Primary Care

## 2014-11-24 ENCOUNTER — Ambulatory Visit (INDEPENDENT_AMBULATORY_CARE_PROVIDER_SITE_OTHER): Payer: Medicare Other | Admitting: Primary Care

## 2014-11-24 VITALS — BP 116/58 | HR 76 | Temp 98.0°F | Ht 67.0 in | Wt 226.8 lb

## 2014-11-24 DIAGNOSIS — N949 Unspecified condition associated with female genital organs and menstrual cycle: Secondary | ICD-10-CM

## 2014-11-24 DIAGNOSIS — R102 Pelvic and perineal pain: Secondary | ICD-10-CM | POA: Insufficient documentation

## 2014-11-24 DIAGNOSIS — R319 Hematuria, unspecified: Secondary | ICD-10-CM | POA: Diagnosis not present

## 2014-11-24 LAB — POCT URINALYSIS DIPSTICK
Bilirubin, UA: NEGATIVE
Glucose, UA: NEGATIVE
Ketones, UA: NEGATIVE
Leukocytes, UA: NEGATIVE
Nitrite, UA: NEGATIVE
PROTEIN UA: NEGATIVE
Spec Grav, UA: 1.01
UROBILINOGEN UA: 4
pH, UA: 6

## 2014-11-24 NOTE — Assessment & Plan Note (Signed)
Appears to be originating to where ovaries are located, and she is unsure if her ovaries remain after hysterectomy.  Urinalysis dipstick negative except for +1 blood. Sent for urine micro. Patient declined pelvic exam. Follow up with Dr. Deborra Medina next week for re-evaluation.

## 2014-11-24 NOTE — Patient Instructions (Signed)
You had a small amount of blood in your urine, and do not have a urinary tract infection today. Please return in one week for a follow up appointment to have this rechecked. Please call if you notice any blood in the toilet bowl or if you develop worsening irritation. Please schedule an appointment with Dr. Deborra Medina next week to discuss your medications and the blood in your urine. I hope you feel better!

## 2014-11-24 NOTE — Progress Notes (Signed)
Subjective:    Patient ID: Pricilla Riffle, female    DOB: 02-01-1951, 64 y.o.   MRN: [id number]  HPI  Ms. Normington is a 64 year old female who presents today for further evaluation of bilateral groin pain. She denies symptoms of dysuria, frequency, urgency, hematuria, but does report intermittent sharp pain to the bilateral groin (lasting 1-2 seconds) that has occurred 3 times when she lays flat. She reports she was once in the emergency department due to a syncopal episode and was found with a urinary tract infection. She is concerned she may have either a UTI or cancer. Denies vaginal pain or bleeding, denies abdominal symptoms, and reports she feels well overall. She admits to not drinking enough water and could possibly be dehydrated. She has not taken anything for this pain due to it lasting only seconds and its frequency of 3 episodes.  Review of Systems  Constitutional: Negative for fever and chills.  HENT: Negative for rhinorrhea.   Respiratory: Negative for cough and shortness of breath.   Cardiovascular: Negative for chest pain.  Gastrointestinal: Negative for abdominal pain, diarrhea, constipation, blood in stool and anal bleeding.  Genitourinary: Positive for pelvic pain. Negative for dysuria, urgency, frequency, hematuria, vaginal bleeding and vaginal discharge.       Twinge in groin area.  Musculoskeletal: Negative for myalgias.  Neurological: Negative for dizziness and weakness.  Hematological: Negative for adenopathy.       Past Medical History  Diagnosis Date  . Chronic diastolic heart failure   . Congestive heart failure, unspecified   . Other dyspnea and respiratory abnormality   . Essential hypertension, benign   . Other and unspecified hyperlipidemia   . Type II or unspecified type diabetes mellitus with peripheral circulatory disorders, not stated as uncontrolled(250.70)   . Anxiety state, unspecified   . Depressive disorder, not elsewhere classified   .  Unspecified asthma, with exacerbation   . Recent retinal detachment, partial, with giant tear   . Anemia, unspecified   . Wheezing   . Acute upper respiratory infections of unspecified site   . Contact dermatitis and other eczema, due to unspecified cause   . Other diseases of lung, not elsewhere classified   . Unspecified urinary incontinence   . Cellulitis   . Mastoiditis   . Diabetes mellitus     History   Social History  . Marital Status: Married    Spouse Name: N/A  . Number of Children: N/A  . Years of Education: N/A   Occupational History  . School bus driver    Social History Main Topics  . Smoking status: Never Smoker   . Smokeless tobacco: Never Used  . Alcohol Use: No  . Drug Use: No  . Sexual Activity: Not on file   Other Topics Concern  . Not on file   Social History Narrative    Past Surgical History  Procedure Laterality Date  . Knee surgery  2003    meniscus  . Eye surgery      Left retina repair- 11/09, 02-20-09  . Laparoscopic gastric banding  08/21/10  . Abdominal hysterectomy    . Dilation and curettage of uterus      Family History  Problem Relation Age of Onset  . Cancer Mother     breast  . Cancer Sister     breast  . Cancer Maternal Aunt     breast  . Cancer Maternal Uncle     breast  . Cancer  Maternal Grandmother     breast    Allergies  Allergen Reactions  . Aspirin Other (See Comments)    asthma  . Codeine Nausea And Vomiting    Current Outpatient Prescriptions on File Prior to Visit  Medication Sig Dispense Refill  . albuterol (PROVENTIL,VENTOLIN) 90 MCG/ACT inhaler Inhale 2 puffs into the lungs every 6 (six) hours as needed. Inhale 2 puffs using inhaler once a day as needed. 17 g 2  . busPIRone (BUSPAR) 10 MG tablet Take 1 tablet (10 mg total) by mouth at bedtime. 90 tablet 1  . FLUoxetine (PROZAC) 20 MG capsule Take 3 capsules (60 mg total) by mouth daily. 90 capsule 2  . furosemide (LASIX) 20 MG tablet Take 1 tablet  (20 mg total) by mouth daily. Take one by mouth in the morning. 90 tablet 0  . glucose blood (ONE TOUCH ULTRA TEST) test strip Check blood sugar four times a day. Dx 250.70 100 each 5  . glyBURIDE-metformin (GLUCOVANCE) 5-500 MG per tablet Take 1 tablet by mouth 2 (two) times daily with a meal. 60 tablet 5  . insulin glargine (LANTUS) 100 UNIT/ML injection Inject 0.28 mLs (28 Units total) into the skin at bedtime. (Patient taking differently: Inject 25 Units into the skin at bedtime. ) 10 mL 5  . lisinopril (PRINIVIL,ZESTRIL) 10 MG tablet Take 1 tablet (10 mg total) by mouth daily. * Need labs, and appointment with Dr Deborra Medina for refills. 30 tablet 1  . lovastatin (MEVACOR) 20 MG tablet Take 1 tablet (20 mg total) by mouth at bedtime. 90 tablet 0  . meclizine (ANTIVERT) 25 MG tablet Take 1 tablet (25 mg total) by mouth 3 (three) times daily as needed. 20 tablet 0  . Propylene Glycol (SYSTANE BALANCE OP) Place 1 drop into the right eye every hour. For dry eye    . pseudoephedrine (SUDAFED) 30 MG tablet Take 30 mg by mouth once.    . ranitidine (ZANTAC) 150 MG tablet Take 1 tablet (150 mg total) by mouth 2 (two) times daily. 180 tablet 2   No current facility-administered medications on file prior to visit.    BP 116/58 mmHg  Pulse 76  Temp(Src) 98 F (36.7 C) (Oral)  Ht 5\' 7"  (1.702 m)  Wt 226 lb 12.8 oz (102.876 kg)  BMI 35.51 kg/m2  SpO2 97%    Objective:   Physical Exam  Constitutional: She is oriented to person, place, and time. She appears well-developed.  HENT:  Head: Normocephalic.  Neck: Neck supple.  Cardiovascular: Normal rate and regular rhythm.   Pulmonary/Chest: Effort normal and breath sounds normal.  Abdominal: Soft. Bowel sounds are normal. She exhibits no mass. There is no tenderness.  Genitourinary:  Patient declined my suggestion for a pelvic exam.  Lymphadenopathy:    She has no cervical adenopathy.  Neurological: She is alert and oriented to person, place, and  time.  Skin: Skin is warm and dry.  Psychiatric: She has a normal mood and affect.          Assessment & Plan:

## 2014-11-24 NOTE — Assessment & Plan Note (Signed)
Sent urine micro. Re-check urine in one to two weeks. Follow up if development of any UTI symptoms or vaginal bleeding.

## 2014-11-24 NOTE — Progress Notes (Signed)
Pre visit review using our clinic review tool, if applicable. No additional management support is needed unless otherwise documented below in the visit note. 

## 2014-11-28 LAB — URINALYSIS, MICROSCOPIC ONLY

## 2014-12-01 ENCOUNTER — Ambulatory Visit: Payer: Medicare Other | Admitting: Family Medicine

## 2014-12-27 ENCOUNTER — Ambulatory Visit (INDEPENDENT_AMBULATORY_CARE_PROVIDER_SITE_OTHER): Payer: Medicare Other | Admitting: Family Medicine

## 2014-12-27 ENCOUNTER — Encounter: Payer: Self-pay | Admitting: Family Medicine

## 2014-12-27 VITALS — BP 126/50 | HR 83 | Temp 98.3°F | Wt 222.2 lb

## 2014-12-27 DIAGNOSIS — Z1239 Encounter for other screening for malignant neoplasm of breast: Secondary | ICD-10-CM | POA: Diagnosis not present

## 2014-12-27 DIAGNOSIS — I10 Essential (primary) hypertension: Secondary | ICD-10-CM | POA: Diagnosis not present

## 2014-12-27 DIAGNOSIS — Z0000 Encounter for general adult medical examination without abnormal findings: Secondary | ICD-10-CM | POA: Diagnosis not present

## 2014-12-27 DIAGNOSIS — Z23 Encounter for immunization: Secondary | ICD-10-CM

## 2014-12-27 NOTE — Progress Notes (Signed)
Pre visit review using our clinic review tool, if applicable. No additional management support is needed unless otherwise documented below in the visit note. 

## 2014-12-27 NOTE — Patient Instructions (Addendum)
Check with your insurance to see if they will cover the shingles shot.  Please call [facility] to set up your mammogram- [phone].  Please stop taking your lasix for now.  Come see me in two weeks to recheck your blood pressure and blood work.  My favorite brand is OMRON. Check your blood pressure at home as well and keep me updated.

## 2014-12-27 NOTE — Progress Notes (Signed)
Subjective:   Patient ID: [name], female    DOB: [birth year], 64 y.o.   MRN: [id number]  [name] is a pleasant 64 y.o. year old female who presents to clinic today with Follow-up  on 12/27/2014  HPI:  [facility] RN came to her home and suggested that she needed to update her vaccines.  She is due for prevnar 13 and zostavax.   Also due for mammogram.  Dizziness- has been eating right and losing weight.  Has been getting dizzy when she stands from a seated position. Takes Lisinopril 10 mg daily and Lasix 20 mg daily.  Has not had issues with LE edema in years. Lab Results  Component Value Date   NA 137 08/05/2014   K 4.4 08/05/2014   CL 99 08/05/2014   CO2 26 08/05/2014   Lab Results  Component Value Date   CREATININE 0.64 08/05/2014   Current Outpatient Prescriptions on File Prior to Visit  Medication Sig Dispense Refill  . albuterol (PROVENTIL,VENTOLIN) 90 MCG/ACT inhaler Inhale 2 puffs into the lungs every 6 (six) hours as needed. Inhale 2 puffs using inhaler once a day as needed. 17 g 2  . busPIRone (BUSPAR) 10 MG tablet Take 1 tablet (10 mg total) by mouth at bedtime. 90 tablet 1  . FLUoxetine (PROZAC) 20 MG capsule Take 3 capsules (60 mg total) by mouth daily. 90 capsule 2  . furosemide (LASIX) 20 MG tablet Take 1 tablet (20 mg total) by mouth daily. Take one by mouth in the morning. 90 tablet 0  . glucose blood (ONE TOUCH ULTRA TEST) test strip Check blood sugar four times a day. Dx 250.70 100 each 5  . glyBURIDE-metformin (GLUCOVANCE) 5-500 MG per tablet Take 1 tablet by mouth 2 (two) times daily with a meal. 60 tablet 5  . insulin glargine (LANTUS) 100 UNIT/ML injection Inject 0.28 mLs (28 Units total) into the skin at bedtime. (Patient taking differently: Inject 25 Units into the skin at bedtime. ) 10 mL 5  . lisinopril (PRINIVIL,ZESTRIL) 10 MG tablet Take 1 tablet (10 mg total) by mouth daily. * Need labs, and appointment with Dr Deborra Medina for refills. 30 tablet 1    . lovastatin (MEVACOR) 20 MG tablet Take 1 tablet (20 mg total) by mouth at bedtime. 90 tablet 0  . meclizine (ANTIVERT) 25 MG tablet Take 1 tablet (25 mg total) by mouth 3 (three) times daily as needed. 20 tablet 0  . Propylene Glycol (SYSTANE BALANCE OP) Place 1 drop into the right eye every hour. For dry eye    . pseudoephedrine (SUDAFED) 30 MG tablet Take 30 mg by mouth once.    . ranitidine (ZANTAC) 150 MG tablet Take 1 tablet (150 mg total) by mouth 2 (two) times daily. 180 tablet 2   No current facility-administered medications on file prior to visit.    Allergies  Allergen Reactions  . Aspirin Other (See Comments)    asthma  . Codeine Nausea And Vomiting    Past Medical History  Diagnosis Date  . Chronic diastolic heart failure   . Congestive heart failure, unspecified   . Other dyspnea and respiratory abnormality   . Essential hypertension, benign   . Other and unspecified hyperlipidemia   . Type II or unspecified type diabetes mellitus with peripheral circulatory disorders, not stated as uncontrolled(250.70)   . Anxiety state, unspecified   . Depressive disorder, not elsewhere classified   . Unspecified asthma, with exacerbation   . Recent  retinal detachment, partial, with giant tear   . Anemia, unspecified   . Wheezing   . Acute upper respiratory infections of unspecified site   . Contact dermatitis and other eczema, due to unspecified cause   . Other diseases of lung, not elsewhere classified   . Unspecified urinary incontinence   . Cellulitis   . Mastoiditis   . Diabetes mellitus     Past Surgical History  Procedure Laterality Date  . Knee surgery  2003    meniscus  . Eye surgery      Left retina repair- 11/09, 02-20-09  . Laparoscopic gastric banding  08/21/10  . Abdominal hysterectomy    . Dilation and curettage of uterus      Family History  Problem Relation Age of Onset  . Cancer Mother     breast  . Cancer Sister     breast  . Cancer Maternal  Aunt     breast  . Cancer Maternal Uncle     breast  . Cancer Maternal Grandmother     breast    History   Social History  . Marital Status: Married    Spouse Name: N/A  . Number of Children: N/A  . Years of Education: N/A   Occupational History  . School bus driver    Social History Main Topics  . Smoking status: Never Smoker   . Smokeless tobacco: Never Used  . Alcohol Use: No  . Drug Use: No  . Sexual Activity: Not on file   Other Topics Concern  . Not on file   Social History Narrative   The PMH, PSH, Social History, Family History, Medications, and allergies have been reviewed in [facility], and have been updated if relevant.   Review of Systems  Respiratory: Negative.   Cardiovascular: Negative.   Neurological: Positive for dizziness and light-headedness.  All other systems reviewed and are negative.      Objective:    BP 126/50 mmHg  Pulse 83  Temp(Src) 98.3 F (36.8 C) (Oral)  Wt 222 lb 4 oz (100.812 kg)  SpO2 98%   Physical Exam  Constitutional: She is oriented to person, place, and time. She appears well-developed and well-nourished. No distress.  HENT:  Head: Normocephalic.  Eyes: Conjunctivae are normal.  Cardiovascular: Normal rate.   Pulmonary/Chest: Effort normal.  Musculoskeletal: She exhibits no edema.  Neurological: She is alert and oriented to person, place, and time. No cranial nerve deficit.  Skin: Skin is warm and dry.  Psychiatric: She has a normal mood and affect. Her behavior is normal. Judgment and thought content normal.  Nursing note and vitals reviewed.         Assessment & Plan:   Screening for breast cancer - Plan: MM Digital Screening  Preventative health care  HYPERTENSION, BENIGN No Follow-up on file.

## 2014-12-27 NOTE — Addendum Note (Signed)
Addended by: Modena Nunnery on: 12/27/2014 01:02 PM   Modules accepted: Orders

## 2014-12-27 NOTE — Assessment & Plan Note (Signed)
Now with symptoms of hypo tension. Will hold lasix as she likely does not need this.  Not taking potassium- will need to check BMET in [redacted] weeks along with checking her blood pressure. She will also check blood pressure at home and keep me updated.

## 2014-12-27 NOTE — Assessment & Plan Note (Signed)
Prevnar 13 given to pt today. Mammogram ordered. She will call [employer] about zostavax coverage and plan to give this to her when she returns for follow up. The patient indicates understanding of these issues and agrees with the plan.

## 2014-12-28 ENCOUNTER — Ambulatory Visit: Payer: Medicare Other | Admitting: Family Medicine

## 2015-01-09 ENCOUNTER — Encounter: Payer: Self-pay | Admitting: Family Medicine

## 2015-01-09 ENCOUNTER — Telehealth: Payer: Self-pay | Admitting: Family Medicine

## 2015-01-09 ENCOUNTER — Ambulatory Visit (INDEPENDENT_AMBULATORY_CARE_PROVIDER_SITE_OTHER): Payer: Medicare Other | Admitting: Family Medicine

## 2015-01-09 VITALS — BP 116/62 | HR 62 | Temp 98.1°F | Wt 226.0 lb

## 2015-01-09 DIAGNOSIS — E785 Hyperlipidemia, unspecified: Secondary | ICD-10-CM

## 2015-01-09 DIAGNOSIS — E08311 Diabetes mellitus due to underlying condition with unspecified diabetic retinopathy with macular edema: Secondary | ICD-10-CM

## 2015-01-09 DIAGNOSIS — I10 Essential (primary) hypertension: Secondary | ICD-10-CM | POA: Diagnosis not present

## 2015-01-09 LAB — LIPID PANEL
Cholesterol: 158 mg/dL (ref 0–200)
HDL: 35.7 mg/dL — ABNORMAL LOW (ref >39.00)
LDL Cholesterol: 91 mg/dL (ref 0–99)
NONHDL: 122.3
Total CHOL/HDL Ratio: 4
Triglycerides: 155 mg/dL — ABNORMAL HIGH (ref 0.0–149.0)
VLDL: 31 mg/dL (ref 0.0–40.0)

## 2015-01-09 LAB — BASIC METABOLIC PANEL
BUN: 12 mg/dL (ref 6–23)
CO2: 28 meq/L (ref 19–32)
Calcium: 9.1 mg/dL (ref 8.4–10.5)
Chloride: 101 mEq/L (ref 96–112)
Creatinine, Ser: 0.79 mg/dL (ref 0.40–1.20)
GFR: 77.48 mL/min (ref >60.00)
Glucose, Bld: 136 mg/dL — ABNORMAL HIGH (ref 70–99)
Potassium: 4.5 mEq/L (ref 3.5–5.1)
Sodium: 133 mEq/L — ABNORMAL LOW (ref 135–145)

## 2015-01-09 LAB — HEMOGLOBIN A1C: Hgb A1c MFr Bld: 6.6 % — ABNORMAL HIGH (ref 4.6–6.5)

## 2015-01-09 MED ORDER — GLYBURIDE 5 MG PO TABS: 5.0000 mg | ORAL_TABLET | Freq: Two times a day (BID) | ORAL | Status: DC

## 2015-01-09 MED ORDER — BUSPIRONE HCL 10 MG PO TABS: 10.0000 mg | ORAL_TABLET | Freq: Every day | ORAL | Status: DC

## 2015-01-09 MED ORDER — FLUOXETINE HCL 20 MG PO CAPS: 60.0000 mg | ORAL_CAPSULE | Freq: Every day | ORAL | Status: DC

## 2015-01-09 MED ORDER — ZOSTER VACCINE LIVE 19400 UNT/0.65ML ~~LOC~~ SOLR: 0.6500 mL | Freq: Once | SUBCUTANEOUS | Status: DC

## 2015-01-09 MED ORDER — METFORMIN HCL 500 MG PO TABS: 500.0000 mg | ORAL_TABLET | Freq: Two times a day (BID) | ORAL | Status: DC

## 2015-01-09 MED ORDER — LOVASTATIN 20 MG PO TABS: 20.0000 mg | ORAL_TABLET | Freq: Every day | ORAL | Status: DC

## 2015-01-09 MED ORDER — LISINOPRIL 10 MG PO TABS: 10.0000 mg | ORAL_TABLET | Freq: Every day | ORAL | Status: DC

## 2015-01-09 NOTE — Assessment & Plan Note (Addendum)
Well controlled without lasix and dizziness resolved. Check BMET today. Rx given to pt for BP cuff.

## 2015-01-09 NOTE — Assessment & Plan Note (Signed)
Due for labs. Continue statin. The patient indicates understanding of these issues and agrees with the plan.

## 2015-01-09 NOTE — Assessment & Plan Note (Signed)
Improved control. We discussed options- she agrees to breaking up rx to components- glyburide 5 mg twice daily and metformin 500 mg twice daily. eRx sent. Check labs today. On ACEI.

## 2015-01-09 NOTE — Patient Instructions (Signed)
Good to see you. We will call you with your lab results.   

## 2015-01-09 NOTE — Telephone Encounter (Signed)
Pt wants to have a printed copy of labs mailed to her home address when the results are in. Thanks.

## 2015-01-09 NOTE — Progress Notes (Signed)
Subjective:   Patient ID: [name], female    DOB: Oct 18, 1950, 64 y.o.   MRN: [id number]  [name] is a pleasant 64 y.o. year old female who presents to clinic today with Follow-up  on 01/09/2015  HPI:  Here for two week follow up.  When I saw her on 12/27/14, she was complaining of symptoms of orthostatic hypotension. Advised to stop taking lasix and follow up with me here today for repeat blood pressure, assess for edema, and check BMET. Dizziness has resolved, has not had any LE edema.  Has gained 4 pounds but ate pizza last night. No CP or SOB.  Wants rx for BP cuff- insurance will pay if I give her an rx.  DM- Lab Results  Component Value Date   HGBA1C 6.8* 06/30/2014   Insurance will no longer cover glucovance.  Wants to discuss alternatives. FSBS have been great- 70-110 fasting.  No episodes of hypoglycemia.   HLD- on Mevacor 20 mg daily. Denies myalgias.  Due for labs. Lab Results  Component Value Date   CHOL 178 10/27/2013   HDL 38.90* 10/27/2013   LDLCALC 106* 10/27/2013   TRIG 167.0* 10/27/2013   CHOLHDL 5 10/27/2013    Current Outpatient Prescriptions on File Prior to Visit  Medication Sig Dispense Refill  . albuterol (PROVENTIL,VENTOLIN) 90 MCG/ACT inhaler Inhale 2 puffs into the lungs every 6 (six) hours as needed. Inhale 2 puffs using inhaler once a day as needed. 17 g 2  . glucose blood (ONE TOUCH ULTRA TEST) test strip Check blood sugar four times a day. Dx 250.70 100 each 5  . insulin glargine (LANTUS) 100 UNIT/ML injection Inject 0.28 mLs (28 Units total) into the skin at bedtime. (Patient taking differently: Inject 25 Units into the skin at bedtime. ) 10 mL 5  . meclizine (ANTIVERT) 25 MG tablet Take 1 tablet (25 mg total) by mouth 3 (three) times daily as needed. 20 tablet 0  . Propylene Glycol (SYSTANE BALANCE OP) Place 1 drop into the right eye every hour. For dry eye    . pseudoephedrine (SUDAFED) 30 MG tablet Take 30 mg by mouth  once.    . ranitidine (ZANTAC) 150 MG tablet Take 1 tablet (150 mg total) by mouth 2 (two) times daily. 180 tablet 2   No current facility-administered medications on file prior to visit.    Allergies  Allergen Reactions  . Aspirin Other (See Comments)    asthma  . Codeine Nausea And Vomiting    Past Medical History  Diagnosis Date  . Chronic diastolic heart failure   . Congestive heart failure, unspecified   . Other dyspnea and respiratory abnormality   . Essential hypertension, benign   . Other and unspecified hyperlipidemia   . Type II or unspecified type diabetes mellitus with peripheral circulatory disorders, not stated as uncontrolled(250.70)   . Anxiety state, unspecified   . Depressive disorder, not elsewhere classified   . Unspecified asthma, with exacerbation   . Recent retinal detachment, partial, with giant tear   . Anemia, unspecified   . Wheezing   . Acute upper respiratory infections of unspecified site   . Contact dermatitis and other eczema, due to unspecified cause   . Other diseases of lung, not elsewhere classified   . Unspecified urinary incontinence   . Cellulitis   . Mastoiditis   . Diabetes mellitus     Past Surgical History  Procedure Laterality Date  . Knee surgery  2003  meniscus  . Eye surgery      Left retina repair- 11/09, 02-20-09  . Laparoscopic gastric banding  08/21/10  . Abdominal hysterectomy    . Dilation and curettage of uterus      Family History  Problem Relation Age of Onset  . Cancer Mother     breast  . Cancer Sister     breast  . Cancer Maternal Aunt     breast  . Cancer Maternal Uncle     breast  . Cancer Maternal Grandmother     breast    History   Social History  . Marital Status: Married    Spouse Name: N/A  . Number of Children: N/A  . Years of Education: N/A   Occupational History  . School bus driver    Social History Main Topics  . Smoking status: Never Smoker   . Smokeless tobacco: Never Used   . Alcohol Use: No  . Drug Use: No  . Sexual Activity: Not on file   Other Topics Concern  . Not on file   Social History Narrative   The PMH, PSH, Social History, Family History, Medications, and allergies have been reviewed in [facility], and have been updated if relevant.     Review of Systems  Constitutional: Negative.   Eyes: Negative.   Respiratory: Negative.   Cardiovascular: Negative.   Endocrine: Negative.   Genitourinary: Negative.   Musculoskeletal: Negative.   Skin: Negative.   Allergic/Immunologic: Negative.   Neurological: Negative.   Hematological: Negative.   Psychiatric/Behavioral: Negative.   All other systems reviewed and are negative.      Objective:    BP 116/62 mmHg  Pulse 62  Temp(Src) 98.1 F (36.7 C) (Oral)  Wt 226 lb (102.513 kg)  SpO2 98%   Physical Exam  Constitutional: She is oriented to person, place, and time. She appears well-developed and well-nourished. No distress.  HENT:  Head: Normocephalic.  Eyes: Conjunctivae are normal.  Neck: Normal range of motion.  Cardiovascular: Normal rate and regular rhythm.   Pulmonary/Chest: Effort normal and breath sounds normal. No respiratory distress.  Musculoskeletal: She exhibits no edema.  Neurological: She is alert and oriented to person, place, and time. No cranial nerve deficit.  Skin: Skin is warm and dry.  Psychiatric: She has a normal mood and affect. Her behavior is normal. Judgment and thought content normal.  Nursing note and vitals reviewed.         Assessment & Plan:   HYPERTENSION, BENIGN - Plan: Basic metabolic panel No Follow-up on file.

## 2015-01-09 NOTE — Progress Notes (Signed)
Pre visit review using our clinic review tool, if applicable. No additional management support is needed unless otherwise documented below in the visit note. 

## 2015-01-12 ENCOUNTER — Ambulatory Visit
Admission: RE | Admit: 2015-01-12 | Discharge: 2015-01-12 | Disposition: A | Discharge status: Home / Self Care | Payer: Medicare Other | Source: Ambulatory Visit | Attending: Family Medicine | Admitting: Family Medicine

## 2015-01-12 ENCOUNTER — Ambulatory Visit: Payer: Medicare Other

## 2015-01-12 DIAGNOSIS — Z1239 Encounter for other screening for malignant neoplasm of breast: Secondary | ICD-10-CM

## 2015-01-12 NOTE — Telephone Encounter (Signed)
Please call patient with lab results and mail a copy of lab results to patient.

## 2015-01-12 NOTE — Telephone Encounter (Signed)
Labs have not yet been resulted. Pt will be notified upon resulting

## 2015-01-16 ENCOUNTER — Encounter: Payer: Self-pay | Admitting: *Deleted

## 2015-01-16 NOTE — Telephone Encounter (Signed)
See result note.  

## 2015-03-27 ENCOUNTER — Ambulatory Visit (INDEPENDENT_AMBULATORY_CARE_PROVIDER_SITE_OTHER): Payer: Medicare Other | Admitting: Family Medicine

## 2015-03-27 ENCOUNTER — Ambulatory Visit (INDEPENDENT_AMBULATORY_CARE_PROVIDER_SITE_OTHER)
Admission: RE | Admit: 2015-03-27 | Discharge: 2015-03-27 | Disposition: A | Discharge status: Home / Self Care | Payer: Medicare Other | Source: Ambulatory Visit | Attending: Family Medicine | Admitting: Family Medicine

## 2015-03-27 ENCOUNTER — Encounter: Payer: Self-pay | Admitting: Family Medicine

## 2015-03-27 VITALS — BP 120/70 | HR 84 | Temp 98.0°F | Ht 67.0 in | Wt 219.5 lb

## 2015-03-27 DIAGNOSIS — R079 Chest pain, unspecified: Secondary | ICD-10-CM

## 2015-03-27 DIAGNOSIS — E785 Hyperlipidemia, unspecified: Secondary | ICD-10-CM

## 2015-03-27 DIAGNOSIS — R059 Cough, unspecified: Secondary | ICD-10-CM | POA: Insufficient documentation

## 2015-03-27 DIAGNOSIS — R0789 Other chest pain: Secondary | ICD-10-CM | POA: Insufficient documentation

## 2015-03-27 DIAGNOSIS — R05 Cough: Secondary | ICD-10-CM | POA: Diagnosis not present

## 2015-03-27 LAB — COMPREHENSIVE METABOLIC PANEL
ALT: 9 U/L (ref 0–35)
AST: 14 U/L (ref 0–37)
Albumin: 3.9 g/dL (ref 3.5–5.2)
Alkaline Phosphatase: 81 U/L (ref 39–117)
BUN: 11 mg/dL (ref 6–23)
CALCIUM: 9.5 mg/dL (ref 8.4–10.5)
CO2: 30 meq/L (ref 19–32)
Chloride: 100 mEq/L (ref 96–112)
Creatinine, Ser: 0.81 mg/dL (ref 0.40–1.20)
GFR: 75.23 mL/min (ref >60.00)
GLUCOSE: 93 mg/dL (ref 70–99)
Potassium: 4.2 mEq/L (ref 3.5–5.1)
SODIUM: 136 meq/L (ref 135–145)
Total Bilirubin: 0.3 mg/dL (ref 0.2–1.2)
Total Protein: 8.2 g/dL (ref 6.0–8.3)

## 2015-03-27 LAB — CBC WITH DIFFERENTIAL/PLATELET
Basophils Absolute: 0 10*3/uL (ref 0.0–0.1)
Basophils Relative: 0.4 % (ref 0.0–3.0)
EOS ABS: 0 10*3/uL (ref 0.0–0.7)
Eosinophils Relative: 0.7 % (ref 0.0–5.0)
HCT: 31.3 % — ABNORMAL LOW (ref 36.0–46.0)
Hemoglobin: 10.1 g/dL — ABNORMAL LOW (ref 12.0–15.0)
LYMPHS ABS: 0.8 10*3/uL (ref 0.7–4.0)
LYMPHS PCT: 13.4 % (ref 12.0–46.0)
MCHC: 32.2 g/dL (ref 30.0–36.0)
MCV: 75.6 fl — AB (ref 78.0–100.0)
MONOS PCT: 6.6 % (ref 3.0–12.0)
Monocytes Absolute: 0.4 10*3/uL (ref 0.1–1.0)
Neutro Abs: 4.8 10*3/uL (ref 1.4–7.7)
Neutrophils Relative %: 78.9 % — ABNORMAL HIGH (ref 43.0–77.0)
Platelets: 402 10*3/uL — ABNORMAL HIGH (ref 150.0–400.0)
RBC: 4.14 Mil/uL (ref 3.87–5.11)
RDW: 15.4 % (ref 11.5–15.5)
WBC: 6.1 10*3/uL (ref 4.0–10.5)

## 2015-03-27 LAB — TSH: TSH: 1.99 u[IU]/mL (ref 0.35–4.50)

## 2015-03-27 LAB — BRAIN NATRIURETIC PEPTIDE: PRO B NATRI PEPTIDE: 20 pg/mL (ref 0.0–100.0)

## 2015-03-27 NOTE — Progress Notes (Signed)
Pre visit review using our clinic review tool, if applicable. No additional management support is needed unless otherwise documented below in the visit note. 

## 2015-03-27 NOTE — Assessment & Plan Note (Signed)
EKG reassuring.  If labs and xray unremarkable, proceed with 2 d echo. The patient indicates understanding of these issues and agrees with the plan.

## 2015-03-27 NOTE — Progress Notes (Signed)
Subjective:   Patient ID: [name], female    DOB: June 19, 1951, 64 y.o.   MRN: [id number]  [name] is a pleasant 64 y.o. year old female who presents to clinic today with Cough  on 03/27/2015  HPI:  Cough/CP/SOB- noticed a few weeks ago that "I feel something in in my lung."   She thought she was just "paranoid" because two family friends have lung CA  She has been coughing but it is a dry cough.  No hemoptysis.  Has never smoked.  The other night- felt a "twinge" of left sided chest pain at rest.  No other associated symptoms.  Lasted for a few minutes.  Did not take anything for it.  At night, when she lays flat, feels fluid in her throat.  She does not think this is reflux. Upon questioning, she is a little SOB but she thinks this is due to the "heat."  No fevers.  No LE edema.  Lab Results  Component Value Date   WBC 6.1 08/05/2014   HGB 10.9* 08/05/2014   HCT 33.8* 08/05/2014   MCV 78.6 08/05/2014   PLT 303 08/05/2014   Lab Results  Component Value Date   ALT 15 06/30/2014   AST 19 06/30/2014   ALKPHOS 67 06/30/2014   BILITOT 0.4 06/30/2014   Lab Results  Component Value Date   NA 133* 01/09/2015   K 4.5 01/09/2015   CL 101 01/09/2015   CO2 28 01/09/2015   Lab Results  Component Value Date   TSH 1.67 08/09/2009    Current Outpatient Prescriptions on File Prior to Visit  Medication Sig Dispense Refill  . albuterol (PROVENTIL,VENTOLIN) 90 MCG/ACT inhaler Inhale 2 puffs into the lungs every 6 (six) hours as needed. Inhale 2 puffs using inhaler once a day as needed. 17 g 2  . busPIRone (BUSPAR) 10 MG tablet Take 1 tablet (10 mg total) by mouth at bedtime. 90 tablet 3  . FLUoxetine (PROZAC) 20 MG capsule Take 3 capsules (60 mg total) by mouth daily. 90 capsule 3  . glucose blood (ONE TOUCH ULTRA TEST) test strip Check blood sugar four times a day. Dx 250.70 100 each 5  . glyBURIDE (DIABETA) 5 MG tablet Take 1 tablet (5 mg total) by mouth 2 (two)  times daily with a meal. 60 tablet 3  . insulin glargine (LANTUS) 100 UNIT/ML injection Inject 0.28 mLs (28 Units total) into the skin at bedtime. (Patient taking differently: Inject 15 Units into the skin at bedtime. ) 10 mL 5  . lisinopril (PRINIVIL,ZESTRIL) 10 MG tablet Take 1 tablet (10 mg total) by mouth daily. 30 tablet 6  . lovastatin (MEVACOR) 20 MG tablet Take 1 tablet (20 mg total) by mouth at bedtime. 90 tablet 3  . metFORMIN (GLUCOPHAGE) 500 MG tablet Take 1 tablet (500 mg total) by mouth 2 (two) times daily with a meal. 180 tablet 3  . Propylene Glycol (SYSTANE BALANCE OP) Place 1 drop into the right eye every hour. For dry eye    . ranitidine (ZANTAC) 150 MG tablet Take 1 tablet (150 mg total) by mouth 2 (two) times daily. 180 tablet 2  . zoster vaccine live, PF, (ZOSTAVAX) 14431 UNT/0.65ML injection Inject 19,400 Units into the skin once. 1 each 0  . meclizine (ANTIVERT) 25 MG tablet Take 1 tablet (25 mg total) by mouth 3 (three) times daily as needed. (Patient not taking: Reported on 03/27/2015) 20 tablet 0  . pseudoephedrine (SUDAFED) 30  MG tablet Take 30 mg by mouth once.     No current facility-administered medications on file prior to visit.    Allergies  Allergen Reactions  . Aspirin Other (See Comments)    asthma  . Codeine Nausea And Vomiting    Past Medical History  Diagnosis Date  . Chronic diastolic heart failure   . Congestive heart failure, unspecified   . Other dyspnea and respiratory abnormality   . Essential hypertension, benign   . Other and unspecified hyperlipidemia   . Type II or unspecified type diabetes mellitus with peripheral circulatory disorders, not stated as uncontrolled(250.70)   . Anxiety state, unspecified   . Depressive disorder, not elsewhere classified   . Unspecified asthma, with exacerbation   . Recent retinal detachment, partial, with giant tear   . Anemia, unspecified   . Wheezing   . Acute upper respiratory infections of  unspecified site   . Contact dermatitis and other eczema, due to unspecified cause   . Other diseases of lung, not elsewhere classified   . Unspecified urinary incontinence   . Cellulitis   . Mastoiditis   . Diabetes mellitus     Past Surgical History  Procedure Laterality Date  . Knee surgery  2003    meniscus  . Eye surgery      Left retina repair- 11/09, 02-20-09  . Laparoscopic gastric banding  08/21/10  . Abdominal hysterectomy    . Dilation and curettage of uterus      Family History  Problem Relation Age of Onset  . Cancer Mother     breast  . Cancer Sister     breast  . Cancer Maternal Aunt     breast  . Cancer Maternal Uncle     breast  . Cancer Maternal Grandmother     breast    History   Social History  . Marital Status: Married    Spouse Name: N/A  . Number of Children: N/A  . Years of Education: N/A   Occupational History  . School bus driver    Social History Main Topics  . Smoking status: Never Smoker   . Smokeless tobacco: Never Used  . Alcohol Use: No  . Drug Use: No  . Sexual Activity: Not on file   Other Topics Concern  . Not on file   Social History Narrative   The PMH, PSH, Social History, Family History, Medications, and allergies have been reviewed in [facility], and have been updated if relevant.   Review of Systems  Constitutional: Negative.   Respiratory: Positive for cough and shortness of breath. Negative for apnea, chest tightness, wheezing and stridor.   Cardiovascular: Positive for chest pain. Negative for palpitations and leg swelling.  Gastrointestinal: Negative.   Genitourinary: Negative.   Musculoskeletal: Negative.   Skin: Negative.   Hematological: Negative.   Psychiatric/Behavioral: Negative.   All other systems reviewed and are negative.      Objective:    BP 120/70 mmHg  Pulse 84  Temp(Src) 98 F (36.7 C) (Oral)  Ht 5\' 7"  (1.702 m)  Wt 219 lb 8 oz (99.565 kg)  BMI 34.37 kg/m2  SpO2 97%   Physical Exam    Constitutional: She is oriented to person, place, and time. She appears well-developed and well-nourished. No distress.  HENT:  Head: Normocephalic.  Eyes: Conjunctivae are normal.  Neck: Normal range of motion.  Cardiovascular: Normal rate, regular rhythm and normal heart sounds.   Pulmonary/Chest: Effort normal and breath sounds normal.  No respiratory distress. She has no wheezes.  Musculoskeletal: She exhibits no edema.  Neurological: She is alert and oriented to person, place, and time. No cranial nerve deficit.  Skin: Skin is warm and dry.  Psychiatric: She has a normal mood and affect. Her behavior is normal. Judgment and thought content normal.  Nursing note and vitals reviewed.         Assessment & Plan:   Chest pain, unspecified chest pain type - Plan: EKG 12-Lead  Other chest pain No Follow-up on file.

## 2015-03-27 NOTE — Assessment & Plan Note (Signed)
Lung exam reassuring but given progression and associated symptoms, check CXR, labs including BNP.

## 2015-03-28 ENCOUNTER — Other Ambulatory Visit: Payer: Self-pay | Admitting: Family Medicine

## 2015-03-28 DIAGNOSIS — R9389 Abnormal findings on diagnostic imaging of other specified body structures: Secondary | ICD-10-CM

## 2015-03-29 ENCOUNTER — Ambulatory Visit (INDEPENDENT_AMBULATORY_CARE_PROVIDER_SITE_OTHER)
Admission: RE | Admit: 2015-03-29 | Discharge: 2015-03-29 | Disposition: A | Discharge status: Home / Self Care | Payer: Medicare Other | Source: Ambulatory Visit | Attending: Family Medicine | Admitting: Family Medicine

## 2015-03-29 DIAGNOSIS — R938 Abnormal findings on diagnostic imaging of other specified body structures: Secondary | ICD-10-CM

## 2015-03-29 DIAGNOSIS — R9389 Abnormal findings on diagnostic imaging of other specified body structures: Secondary | ICD-10-CM

## 2015-03-30 ENCOUNTER — Other Ambulatory Visit: Payer: Self-pay | Admitting: Family Medicine

## 2015-03-30 DIAGNOSIS — R188 Other ascites: Secondary | ICD-10-CM

## 2015-03-31 ENCOUNTER — Encounter: Payer: Self-pay | Admitting: Gynecology

## 2015-03-31 ENCOUNTER — Ambulatory Visit (INDEPENDENT_AMBULATORY_CARE_PROVIDER_SITE_OTHER)
Admission: RE | Admit: 2015-03-31 | Discharge: 2015-03-31 | Disposition: A | Discharge status: Home / Self Care | Payer: Medicare Other | Source: Ambulatory Visit | Attending: Family Medicine | Admitting: Family Medicine

## 2015-03-31 ENCOUNTER — Telehealth: Payer: Self-pay | Admitting: *Deleted

## 2015-03-31 ENCOUNTER — Other Ambulatory Visit: Payer: Self-pay | Admitting: Family Medicine

## 2015-03-31 ENCOUNTER — Ambulatory Visit: Payer: Medicare Other | Attending: Gynecology | Admitting: Gynecology

## 2015-03-31 ENCOUNTER — Encounter: Payer: Self-pay | Admitting: Gynecologic Oncology

## 2015-03-31 ENCOUNTER — Telehealth: Payer: Self-pay | Admitting: Family Medicine

## 2015-03-31 VITALS — BP 129/51 | HR 88 | Temp 98.0°F | Resp 22 | Ht 67.0 in | Wt 219.7 lb

## 2015-03-31 DIAGNOSIS — C786 Secondary malignant neoplasm of retroperitoneum and peritoneum: Secondary | ICD-10-CM | POA: Insufficient documentation

## 2015-03-31 DIAGNOSIS — R188 Other ascites: Secondary | ICD-10-CM

## 2015-03-31 DIAGNOSIS — R1900 Intra-abdominal and pelvic swelling, mass and lump, unspecified site: Secondary | ICD-10-CM | POA: Diagnosis present

## 2015-03-31 DIAGNOSIS — C801 Malignant (primary) neoplasm, unspecified: Secondary | ICD-10-CM | POA: Diagnosis not present

## 2015-03-31 DIAGNOSIS — C579 Malignant neoplasm of female genital organ, unspecified: Secondary | ICD-10-CM

## 2015-03-31 MED ORDER — IOHEXOL 300 MG/ML  SOLN
100.0000 mL | Freq: Once | INTRAMUSCULAR | Status: AC | PRN
  Administered 2015-03-31: 100 mL via INTRAVENOUS

## 2015-03-31 NOTE — Telephone Encounter (Signed)
Received call report from radiologist- Dr. Kathlene Cote- Extensive malignancy throughout the abdomen.  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  I called pt and informed her of these results and placed an urgent gyn onc referral.

## 2015-03-31 NOTE — Progress Notes (Signed)
Consult Note: Gyn-Onc   Pricilla Riffle 64 y.o. female  Chief Complaint  Patient presents with  . pelvic mass    New consult    Assessment and plan : Physical exam and CT findings consistent with advanced ovarian cancer. In my review the CT scan do not believe the patient can be optimally debulked at this juncture and therefore would recommend she undergo neo-adjuvant chemotherapy. We will obtain a fine-needle aspirate for histologic confirmation before initiating chemotherapy. This is discussed with the patient and her husband are in agreement with this plan. I would suggest reassessing the patient after 3 cycles. We'll obtain a baseline CA-125.    HPI: 64 year old white married female seen in consultation at the request of Dr.Aron regarding recent CT scan suggesting carcinomatosis and a pelvic mass consistent with ovarian cancer. Patient initially presented with what she felt was "something in her long". She also describes what sounds like reflux. Initially she underwent CT scan of the chest which showed small to moderate right pleural effusion abdominal ascites with stranding in the greater omentum and peritoneal carcinomatosis. Subsequent lid patient underwent a CT scan of the abdomen and pelvis revealing extensive malignancy and the peritoneal cavity with carcinomatosis and tumor implants in the omentum and a 12 cm pelvic mass likely originating from the right ovary.   Previous the patient's had an abdominal hysterectomy for menorrhagia.  The patient is a very strong family history of breast and ovarian cancer including breast cancer in men a young age. (We will plan genetic counseling later).  Review of Systems:10 point review of systems is negative except as noted in interval history.   Vitals: Blood pressure 129/51, pulse 88, temperature 98 F (36.7 C), temperature source Oral, resp. rate 22, height 5\' 7"  (1.702 m), weight 219 lb 11.2 oz (99.655 kg), SpO2 99 %.  Physical  Exam: General : The patient is a healthy woman in no acute distress.  HEENT: normocephalic, extraoccular movements normal; neck is supple without thyromegally  Lynphnodes: Supraclavicular and inguinal nodes not enlarged  Abdomen: Obese, Soft, non-tender, no ascites, no organomegally, no masses, no hernias  Pelvic:  EGBUS: Normal female  Vagina: Normal, no lesions  Urethra and Bladder: Normal, non-tender  Cervix: Surgically absent  Uterus: Surgically absent  Bi-manual examination: There is a firm mass filling the pelvis measuring approximately 8 cm in diameter. Rectal: normal sphincter tone, no masses, no blood  Lower extremities: No edema or varicosities. Normal range of motion      Allergies  Allergen Reactions  . Aspirin Other (See Comments)    asthma  . Codeine Nausea And Vomiting    Past Medical History  Diagnosis Date  . Chronic diastolic heart failure   . Congestive heart failure, unspecified   . Other dyspnea and respiratory abnormality   . Essential hypertension, benign   . Other and unspecified hyperlipidemia   . Type II or unspecified type diabetes mellitus with peripheral circulatory disorders, not stated as uncontrolled(250.70)   . Anxiety state, unspecified   . Depressive disorder, not elsewhere classified   . Unspecified asthma, with exacerbation   . Recent retinal detachment, partial, with giant tear   . Anemia, unspecified   . Wheezing   . Acute upper respiratory infections of unspecified site   . Contact dermatitis and other eczema, due to unspecified cause   . Other diseases of lung, not elsewhere classified   . Unspecified urinary incontinence   . Cellulitis   . Mastoiditis   . Diabetes  mellitus     Past Surgical History  Procedure Laterality Date  . Knee surgery  2003    meniscus  . Eye surgery      Left retina repair- 11/09, 02-20-09  . Laparoscopic gastric banding  08/21/10  . Abdominal hysterectomy    . Dilation and curettage of uterus       Current Outpatient Prescriptions  Medication Sig Dispense Refill  . busPIRone (BUSPAR) 10 MG tablet Take 1 tablet (10 mg total) by mouth at bedtime. 90 tablet 3  . cycloSPORINE (RESTASIS) 0.05 % ophthalmic emulsion Place 1 drop into both eyes 2 (two) times daily.    Marland Kitchen FLUoxetine (PROZAC) 20 MG capsule Take 3 capsules (60 mg total) by mouth daily. 90 capsule 3  . glucose blood (ONE TOUCH ULTRA TEST) test strip Check blood sugar four times a day. Dx 250.70 100 each 5  . glyBURIDE (DIABETA) 5 MG tablet Take 1 tablet (5 mg total) by mouth 2 (two) times daily with a meal. 60 tablet 3  . insulin glargine (LANTUS) 100 UNIT/ML injection Inject 0.28 mLs (28 Units total) into the skin at bedtime. (Patient taking differently: Inject 15 Units into the skin at bedtime. ) 10 mL 5  . lisinopril (PRINIVIL,ZESTRIL) 10 MG tablet Take 1 tablet (10 mg total) by mouth daily. 30 tablet 6  . lovastatin (MEVACOR) 20 MG tablet Take 1 tablet (20 mg total) by mouth at bedtime. 90 tablet 3  . metFORMIN (GLUCOPHAGE) 500 MG tablet Take 1 tablet (500 mg total) by mouth 2 (two) times daily with a meal. 180 tablet 3  . Propylene Glycol (SYSTANE BALANCE OP) Place 1 drop into the right eye every hour. For dry eye    . ranitidine (ZANTAC) 150 MG tablet Take 1 tablet (150 mg total) by mouth 2 (two) times daily. 180 tablet 2  . albuterol (PROVENTIL,VENTOLIN) 90 MCG/ACT inhaler Inhale 2 puffs into the lungs every 6 (six) hours as needed. Inhale 2 puffs using inhaler once a day as needed. (Patient not taking: Reported on 03/31/2015) 17 g 2  . meclizine (ANTIVERT) 25 MG tablet Take 1 tablet (25 mg total) by mouth 3 (three) times daily as needed. (Patient not taking: Reported on 03/27/2015) 20 tablet 0  . zoster vaccine live, PF, (ZOSTAVAX) 94503 UNT/0.65ML injection Inject 19,400 Units into the skin once. 1 each 0   No current facility-administered medications for this visit.    History   Social History  . Marital Status: Married     Spouse Name: N/A  . Number of Children: N/A  . Years of Education: N/A   Occupational History  . School bus driver    Social History Main Topics  . Smoking status: Never Smoker   . Smokeless tobacco: Never Used  . Alcohol Use: No  . Drug Use: No  . Sexual Activity: Not on file   Other Topics Concern  . Not on file   Social History Narrative    Family History  Problem Relation Age of Onset  . Cancer Mother     breast  . Cancer Sister     breast  . Cancer Maternal Aunt     breast  . Cancer Maternal Uncle     breast  . Cancer Maternal Grandmother     breast  . Cancer Maternal Uncle       CLARKE-PEARSON,Desiderio Dolata L, MD 03/31/2015, 1:20 PM

## 2015-03-31 NOTE — Patient Instructions (Signed)
You will receive a phone call from Radiology to schedule an appointment to come in to [facility] for a biopsy.  You will also receive a phone call from the [place] with a date and time to meet with Dr. Evlyn Clines, medical oncologist, who would be giving chemotherapy.  Please call for any questions or concerns.  We will plan for three cycles of carboplatin and taxol then potential surgery, followed by three more cycles.

## 2015-03-31 NOTE — Telephone Encounter (Signed)
Notified pt on scheduled appointments on 04/05/2015 and 04/07/2015. Pt agreed with times and dates.

## 2015-04-03 ENCOUNTER — Telehealth: Payer: Self-pay | Admitting: Nurse Practitioner

## 2015-04-03 ENCOUNTER — Other Ambulatory Visit: Payer: Self-pay | Admitting: Radiology

## 2015-04-03 NOTE — Telephone Encounter (Signed)
RN calling to confirm receipt of CT biopsy schedule at [facility] CT department. She has already been notified of when to arrive and that she will need a driver for apt. Patient asking if she can take medication, per CT tech at [facility], [place] to take medication; however, RN informed her she may need to hold certain diabetic meds that are based on food intake in the morning. She verbalizes understanding.

## 2015-04-04 ENCOUNTER — Encounter (HOSPITAL_COMMUNITY): Payer: Self-pay

## 2015-04-04 ENCOUNTER — Ambulatory Visit (HOSPITAL_COMMUNITY)
Admission: RE | Admit: 2015-04-04 | Discharge: 2015-04-04 | Disposition: A | Discharge status: Home / Self Care | Payer: Medicare Other | Source: Ambulatory Visit | Attending: Gynecologic Oncology | Admitting: Gynecologic Oncology

## 2015-04-04 DIAGNOSIS — R0602 Shortness of breath: Secondary | ICD-10-CM | POA: Insufficient documentation

## 2015-04-04 DIAGNOSIS — R1900 Intra-abdominal and pelvic swelling, mass and lump, unspecified site: Secondary | ICD-10-CM

## 2015-04-04 DIAGNOSIS — J90 Pleural effusion, not elsewhere classified: Secondary | ICD-10-CM | POA: Insufficient documentation

## 2015-04-04 DIAGNOSIS — E785 Hyperlipidemia, unspecified: Secondary | ICD-10-CM | POA: Insufficient documentation

## 2015-04-04 DIAGNOSIS — Z803 Family history of malignant neoplasm of breast: Secondary | ICD-10-CM | POA: Diagnosis not present

## 2015-04-04 DIAGNOSIS — C801 Malignant (primary) neoplasm, unspecified: Secondary | ICD-10-CM

## 2015-04-04 DIAGNOSIS — Z794 Long term (current) use of insulin: Secondary | ICD-10-CM | POA: Insufficient documentation

## 2015-04-04 DIAGNOSIS — I5032 Chronic diastolic (congestive) heart failure: Secondary | ICD-10-CM | POA: Diagnosis not present

## 2015-04-04 DIAGNOSIS — J45909 Unspecified asthma, uncomplicated: Secondary | ICD-10-CM | POA: Diagnosis not present

## 2015-04-04 DIAGNOSIS — R0789 Other chest pain: Secondary | ICD-10-CM | POA: Diagnosis not present

## 2015-04-04 DIAGNOSIS — I10 Essential (primary) hypertension: Secondary | ICD-10-CM | POA: Insufficient documentation

## 2015-04-04 DIAGNOSIS — R1902 Left upper quadrant abdominal swelling, mass and lump: Secondary | ICD-10-CM | POA: Diagnosis present

## 2015-04-04 DIAGNOSIS — E1151 Type 2 diabetes mellitus with diabetic peripheral angiopathy without gangrene: Secondary | ICD-10-CM | POA: Diagnosis not present

## 2015-04-04 DIAGNOSIS — C482 Malignant neoplasm of peritoneum, unspecified: Secondary | ICD-10-CM | POA: Insufficient documentation

## 2015-04-04 DIAGNOSIS — Z79899 Other long term (current) drug therapy: Secondary | ICD-10-CM | POA: Diagnosis not present

## 2015-04-04 DIAGNOSIS — C786 Secondary malignant neoplasm of retroperitoneum and peritoneum: Secondary | ICD-10-CM

## 2015-04-04 LAB — GLUCOSE, CAPILLARY: GLUCOSE-CAPILLARY: 108 mg/dL — AB (ref 65–99)

## 2015-04-04 LAB — CBC
HCT: 31.8 % — ABNORMAL LOW (ref 36.0–46.0)
Hemoglobin: 9.8 g/dL — ABNORMAL LOW (ref 12.0–15.0)
MCH: 23.4 pg — ABNORMAL LOW (ref 26.0–34.0)
MCHC: 30.8 g/dL (ref 30.0–36.0)
MCV: 76.1 fL — AB (ref 78.0–100.0)
PLATELETS: 371 10*3/uL (ref 150–400)
RBC: 4.18 MIL/uL (ref 3.87–5.11)
RDW: 14.4 % (ref 11.5–15.5)
WBC: 6 10*3/uL (ref 4.0–10.5)

## 2015-04-04 LAB — PROTIME-INR
INR: 1.18 (ref 0.00–1.49)
Prothrombin Time: 15.2 seconds (ref 11.6–15.2)

## 2015-04-04 LAB — APTT: aPTT: 27 seconds (ref 24–37)

## 2015-04-04 MED ORDER — FENTANYL CITRATE (PF) 100 MCG/2ML IJ SOLN
INTRAMUSCULAR | Status: AC | PRN
  Administered 2015-04-04: 50 ug via INTRAVENOUS

## 2015-04-04 MED ORDER — SODIUM CHLORIDE 0.9 % IV SOLN: INTRAVENOUS | Status: DC

## 2015-04-04 MED ORDER — MIDAZOLAM HCL 2 MG/2ML IJ SOLN
INTRAMUSCULAR | Status: AC | PRN
  Administered 2015-04-04: 1 mg via INTRAVENOUS

## 2015-04-04 MED ORDER — LIDOCAINE HCL 1 % IJ SOLN
INTRAMUSCULAR | Status: AC
  Filled 2015-04-04: qty 20

## 2015-04-04 MED ORDER — FENTANYL CITRATE (PF) 100 MCG/2ML IJ SOLN
INTRAMUSCULAR | Status: AC
  Filled 2015-04-04: qty 4

## 2015-04-04 MED ORDER — MIDAZOLAM HCL 2 MG/2ML IJ SOLN
INTRAMUSCULAR | Status: AC
  Filled 2015-04-04: qty 6

## 2015-04-04 NOTE — Procedures (Signed)
LUQ peritoneal mass 18 g core times 4 No comp/EBL

## 2015-04-04 NOTE — H&P (Signed)
Chief Complaint: Patient was seen in consultation today for pelvic mass at the request of Cross,Melissa D  Referring Physician(s): Dr Loreli Dollar  History of Present Illness: [name] is a 64 y.o. female   Pt with very strong family hx cancer Never has had cancer personally Noticed SOB and some chest discomfort which led to CXR 7/11 Revealed Rt pleural effusion CT 7/13:   IMPRESSION: Small to moderate right pleural effusion with compressive right lower lobe atelectasis. No radiographic evidence of pulmonary neoplasm or consolidation.  Abdominal ascites, with stranding seen in the greater omentum. Peritoneal carcinomatosis cannot be excluded. Consider abdomen pelvis CT with contrast for further evaluation.  CT Abd/Pelvis 7/15:  IMPRESSION: 1. Stable to slightly larger right pleural effusion. 2. 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 This likely originates from the right ovary but could also potentially represent uterine malignancy. There is an associated small amount of ascites. Referral to Gynecologic Oncology is Recommended.  Now scheduled for pelvic mass/peritonneal mass biopsy  Past Medical History  Diagnosis Date  . Chronic diastolic heart failure   . Congestive heart failure, unspecified   . Other dyspnea and respiratory abnormality   . Essential hypertension, benign   . Other and unspecified hyperlipidemia   . Type II or unspecified type diabetes mellitus with peripheral circulatory disorders, not stated as uncontrolled(250.70)   . Anxiety state, unspecified   . Depressive disorder, not elsewhere classified   . Unspecified asthma, with exacerbation   . Recent retinal detachment, partial, with giant tear   . Anemia,  unspecified   . Wheezing   . Acute upper respiratory infections of unspecified site   . Contact dermatitis and other eczema, due to unspecified cause   . Other diseases of lung, not elsewhere classified   . Unspecified urinary incontinence   . Cellulitis   . Mastoiditis   . Diabetes mellitus     Past Surgical History  Procedure Laterality Date  . Knee surgery  2003    meniscus  . Eye surgery      Left retina repair- 11/09, 02-20-09  . Laparoscopic gastric banding  08/21/10  . Abdominal hysterectomy    . Dilation and curettage of uterus      Allergies: Aspirin and Codeine  Medications: Prior to Admission medications   Medication Sig Start Date End Date Taking? Authorizing Provider  busPIRone (BUSPAR) 10 MG tablet Take 1 tablet (10 mg total) by mouth at bedtime. 01/09/15  Yes Lucille Passy, MD  cycloSPORINE (RESTASIS) 0.05 % ophthalmic emulsion Place 1 drop into both eyes 2 (two) times daily.   Yes Historical Provider, MD  FLUoxetine (PROZAC) 20 MG capsule Take 3 capsules (60 mg total) by mouth daily. 01/09/15  Yes Lucille Passy, MD  glucose blood (ONE TOUCH ULTRA TEST) test strip Check blood sugar four times a day. Dx 250.70 08/27/12  Yes Lucille Passy, MD  glyBURIDE (DIABETA) 5 MG tablet Take 1 tablet (5 mg total) by mouth 2 (two) times daily with a meal. 01/09/15  Yes Lucille Passy, MD  insulin glargine (LANTUS) 100 UNIT/ML injection Inject 0.28 mLs (28 Units total) into the skin at bedtime. Patient taking differently: Inject 15 Units into the skin at bedtime.  07/12/14  Yes Lucille Passy, MD  lisinopril (  PRINIVIL,ZESTRIL) 10 MG tablet Take 1 tablet (10 mg total) by mouth daily. 01/09/15  Yes Lucille Passy, MD  lovastatin (MEVACOR) 20 MG tablet Take 1 tablet (20 mg total) by mouth at bedtime. 01/09/15  Yes Lucille Passy, MD  metFORMIN (GLUCOPHAGE) 500 MG tablet Take 1 tablet (500 mg total) by mouth 2 (two) times daily with a meal. 01/09/15  Yes Lucille Passy, MD  ranitidine (ZANTAC) 150 MG  tablet Take 1 tablet (150 mg total) by mouth 2 (two) times daily. 11/14/14  Yes Lucille Passy, MD  zoster vaccine live, PF, (ZOSTAVAX) 49449 UNT/0.65ML injection Inject 19,400 Units into the skin once. 01/09/15  Yes Lucille Passy, MD     Family History  Problem Relation Age of Onset  . Cancer Mother     breast  . Cancer Sister     breast  . Cancer Maternal Aunt     breast  . Cancer Maternal Uncle     breast  . Cancer Maternal Grandmother     breast  . Cancer Maternal Uncle     History   Social History  . Marital Status: Married    Spouse Name: N/A  . Number of Children: N/A  . Years of Education: N/A   Occupational History  . School bus driver    Social History Main Topics  . Smoking status: Never Smoker   . Smokeless tobacco: Never Used  . Alcohol Use: No  . Drug Use: No  . Sexual Activity: Not on file   Other Topics Concern  . None   Social History Narrative     Review of Systems: A 12 point ROS discussed and pertinent positives are indicated in the HPI above.  All other systems are negative.  Review of Systems  Constitutional: Positive for activity change. Negative for fever and fatigue.  Respiratory: Positive for cough, chest tightness and shortness of breath.   Cardiovascular: Negative for chest pain.  Gastrointestinal: Positive for abdominal pain and abdominal distention.  Neurological: Negative for weakness.  Psychiatric/Behavioral: Negative for behavioral problems and confusion.    Vital Signs: BP 134/61 mmHg  Pulse 85  Temp(Src) 98.5 F (36.9 C)  Ht 5\' 7"  (1.702 m)  Wt 218 lb (98.884 kg)  BMI 34.14 kg/m2  SpO2 98%  Physical Exam  Constitutional: She is oriented to person, place, and time.  Cardiovascular: Normal rate, regular rhythm and normal heart sounds.   No murmur heard. Pulmonary/Chest: Effort normal and breath sounds normal. She has no wheezes.  Abdominal: Soft. Bowel sounds are normal. She exhibits distension. There is tenderness.    Musculoskeletal: Normal range of motion.  Neurological: She is alert and oriented to person, place, and time.  Skin: Skin is warm and dry.  Psychiatric: She has a normal mood and affect. Her behavior is normal. Judgment and thought content normal.  Nursing note and vitals reviewed.   Mallampati Score:  MD Evaluation Airway: WNL Heart: WNL Abdomen: WNL Chest/ Lungs: WNL ASA  Classification: 3 Mallampati/Airway Score: Two  Imaging: Dg Chest 2 View  03/27/2015   CLINICAL DATA:  Chest pain  EXAM: CHEST  2 VIEW  COMPARISON:  08/05/2014  FINDINGS: Cardiomediastinal silhouette is stable. There is small right pleural effusion with right basilar atelectasis or infiltrate. No pulmonary edema. Bony thorax is stable.  IMPRESSION: Small right pleural effusion with right basilar atelectasis or infiltrate.   Electronically Signed   By: Lahoma Crocker M.D.   On: 03/27/2015 13:35  Ct Chest Wo Contrast  03/29/2015   CLINICAL DATA:  Shortness of breath and cough. Abnormal chest x-ray with right lung opacity.  EXAM: CT CHEST WITHOUT CONTRAST  TECHNIQUE: Multidetector CT imaging of the chest was performed following the standard protocol without IV contrast.  COMPARISON:  Chest radiograph on 03/27/2015  FINDINGS: Mediastinum/Lymph Nodes: No masses or pathologically enlarged lymph nodes identified on this unenhanced exam.  Lungs/Pleura: A small to moderate right-sided pleural effusion is seen. Compressive atelectasis is seen in the dependent right lower lobe. No evidence of pulmonary consolidation or mass.  Musculoskeletal/Soft Tissues: No suspicious bone lesions or other significant chest wall abnormality.  Upper Abdomen: Gastric lap band seen in expected position. Abdominal ascites noted, as well as soft tissue stranding within the greater omentum. Peritoneal carcinomatosis cannot be excluded.  IMPRESSION: Small to moderate right pleural effusion with compressive right lower lobe atelectasis. No radiographic  evidence of pulmonary neoplasm or consolidation.  Abdominal ascites, with stranding seen in the greater omentum. Peritoneal carcinomatosis cannot be excluded. Consider abdomen pelvis CT with contrast for further evaluation.   Electronically Signed   By: Earle Gell M.D.   On: 03/29/2015 16:30   Ct Abdomen Pelvis W Contrast  03/31/2015   CLINICAL DATA:  Pleural effusion, abdominal swelling, anemia, decreased appetite and ascites. History of prior gastric banding surgery.  EXAM: CT ABDOMEN AND PELVIS WITH CONTRAST  TECHNIQUE: Multidetector CT imaging of the abdomen and pelvis was performed using the standard protocol following bolus administration of intravenous contrast.  CONTRAST:  170mL OMNIPAQUE IOHEXOL 300 MG/ML  SOLN  COMPARISON:  CT of the chest on 03/29/2015  FINDINGS: Stable to slightly larger moderate right pleural effusion at the right lung base causing compressive atelectasis of the right lower lobe.  There is a small amount of ascites scattered throughout the peritoneal cavity. There is extensive tumor throughout the peritoneal cavity with diffuse carcinomatosis present as well as enhancing solid tumor implants abutting the diaphragm in the right perihepatic space as well as solid tumor implants in the omentum. The largest tumor implants are a 3.4 cm mass in the left anterior mid abdomen just deep to the abdominal wall and a 4 cm implant in the midline lower anterior pelvis just deep to the abdominal wall.  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 This mass most likely originates from the right ovary based on positioning but could also potentially represent a uterine malignancy. The mass demonstrates solid tissue as well as cystic areas. There are some scattered lymph nodes throughout the mesentery. No enlarged retroperitoneal or inguinal lymph nodes are seen.  The liver  shows no focal lesions. The gallbladder, pancreas, spleen, adrenal glands and kidneys are unremarkable. No bony lesions are seen. No vascular abnormalities. No evidence of bowel obstruction or perforation. A gastric band shows normal positioning with intact tubing to a subcutaneous port in the right lower abdominal wall. Bony structures show degenerative disc disease of the lumbar spine, most prominently at L5-S1. There is no evidence of bone metastases.  IMPRESSION: 1. Stable to slightly larger right pleural effusion. 2. 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 This likely originates from the right ovary but could  also potentially represent uterine malignancy. There is an associated small amount of ascites. Referral to Gynecologic Oncology is recommended. 3. These results were called by telephone at the time of interpretation on 03/31/2015 at 9:48 am to [facility] in Dr. Hulen Shouts office, who verbally acknowledged these results.   Electronically Signed   By: Aletta Edouard M.D.   On: 03/31/2015 09:53    Labs:  CBC:  Recent Labs  08/05/14 1200 03/27/15 1231 04/04/15 0900  WBC 6.1 6.1 6.0  HGB 10.9* 10.1* 9.8*  HCT 33.8* 31.3* 31.8*  PLT 303 402.0* 371    COAGS:  Recent Labs  04/04/15 0900  INR 1.18    BMP:  Recent Labs  06/30/14 1213 08/05/14 1200 01/09/15 1227 03/27/15 1231  NA 135 137 133* 136  K 4.6 4.4 4.5 4.2  CL 101 99 101 100  CO2 29 26 28 30   GLUCOSE 170* 90 136* 93  BUN 11 11 12 11   CALCIUM 8.9 9.3 9.1 9.5  CREATININE 0.8 0.64 0.79 0.81  GFRNONAA  --  >90  --   --   GFRAA  --  >90  --   --     LIVER FUNCTION TESTS:  Recent Labs  06/30/14 1213 03/27/15 1231  BILITOT 0.4 0.3  AST 19 14  ALT 15 9  ALKPHOS 67 81  PROT 7.7 8.2   ALBUMIN 3.3* 3.9    TUMOR MARKERS: No results for input(s): AFPTM, CEA, CA199, CHROMGRNA in the last 8760 hours.  Assessment and Plan:  Shortness of breath Chest tightness CXR reveals Rt pleural effusion CT Abd/Pelvis shows pelvic/peritoneal masses Strong family Hx Ca Now scheduled for biopsy of same Risks and Benefits discussed with the patient including, but not limited to bleeding, infection, damage to adjacent structures or low yield requiring additional tests. All of the patient's questions were answered, patient is agreeable to proceed. Consent signed and in chart.    Thank you for this interesting consult.  I greatly enjoyed meeting [name] and look forward to participating in their care.  A copy of this report was sent to the requesting provider on this date.  Signed: Will Schier A 04/04/2015, 10:43 AM   I spent a total of  30 Minutes   in face to face in clinical consultation, greater than 50% of which was counseling/coordinating care for pelvic mass bx

## 2015-04-04 NOTE — Discharge Instructions (Signed)
Needle Biopsy °Care After °These instructions give you information on caring for yourself after your procedure. Your doctor may also give you more specific instructions. Call your doctor if you have any problems or questions after your procedure. °HOME CARE °· Rest for 4 hours after your biopsy, except for getting up to go to the bathroom or as told. °· Keep the places where the needles were put in clean and dry. °¨ Do not put powder or lotion on the sites. °¨ Do not shower until 24 hours after the test. Remove all bandages (dressings) before showering. °¨ Remove all bandages at least once every day. Gently clean the sites with soap and water. Keep putting a new bandage on until the skin is closed. °Finding out the results of your test °Ask your doctor when your test results will be ready. Make sure you follow up and get the test results. °GET HELP RIGHT AWAY IF:  °· You have shortness of breath or trouble breathing. °· You have pain or cramping in your belly (abdomen). °· You feel sick to your stomach (nauseous) or throw up (vomit). °· Any of the places where the needles were put in: °¨ Are puffy (swollen) or red. °¨ Are sore or hot to the touch. °¨ Are draining yellowish-white fluid (pus). °¨ Are bleeding after 10 minutes of pressing down on the site. Have someone keep pressing on any place that is bleeding until you see a doctor. °· You have any unusual pain that will not stop. °· You have a fever. °If you go to the emergency room, tell the nurse that you had a biopsy. Take this paper with you to show the nurse. °MAKE SURE YOU:  °· Understand these instructions. °· Will watch your condition. °· Will get help right away if you are not doing well or get worse. °Document Released: 08/15/2008 Document Revised: 11/25/2011 Document Reviewed: 08/15/2008 °[employer]® Patient Information ©2015 [employer]. This information is not intended to replace advice given to you by your health care provider. Make sure you discuss  any questions you have with your health care provider. ° °

## 2015-04-04 NOTE — Sedation Documentation (Signed)
Bandaid L abd flank intact

## 2015-04-05 ENCOUNTER — Other Ambulatory Visit: Payer: Self-pay | Admitting: Oncology

## 2015-04-05 ENCOUNTER — Other Ambulatory Visit: Payer: Medicare Other

## 2015-04-05 ENCOUNTER — Encounter: Payer: Self-pay | Admitting: *Deleted

## 2015-04-05 DIAGNOSIS — C801 Malignant (primary) neoplasm, unspecified: Principal | ICD-10-CM

## 2015-04-05 DIAGNOSIS — D509 Iron deficiency anemia, unspecified: Secondary | ICD-10-CM

## 2015-04-05 DIAGNOSIS — C786 Secondary malignant neoplasm of retroperitoneum and peritoneum: Secondary | ICD-10-CM

## 2015-04-07 ENCOUNTER — Ambulatory Visit (HOSPITAL_COMMUNITY)
Admission: RE | Admit: 2015-04-07 | Discharge: 2015-04-07 | Disposition: A | Discharge status: Home / Self Care | Payer: Medicare Other | Source: Ambulatory Visit | Attending: Oncology | Admitting: Oncology

## 2015-04-07 ENCOUNTER — Ambulatory Visit (HOSPITAL_BASED_OUTPATIENT_CLINIC_OR_DEPARTMENT_OTHER): Payer: Medicare Other | Admitting: Oncology

## 2015-04-07 ENCOUNTER — Other Ambulatory Visit (HOSPITAL_BASED_OUTPATIENT_CLINIC_OR_DEPARTMENT_OTHER): Payer: Medicare Other

## 2015-04-07 ENCOUNTER — Encounter: Payer: Self-pay | Admitting: Oncology

## 2015-04-07 ENCOUNTER — Ambulatory Visit (HOSPITAL_COMMUNITY)
Admission: RE | Admit: 2015-04-07 | Discharge: 2015-04-07 | Disposition: A | Discharge status: Home / Self Care | Payer: Medicare Other | Source: Ambulatory Visit | Attending: Radiology | Admitting: Radiology

## 2015-04-07 ENCOUNTER — Telehealth: Payer: Self-pay | Admitting: Oncology

## 2015-04-07 VITALS — BP 149/52 | HR 100 | Temp 98.3°F | Resp 18 | Ht 67.0 in | Wt 218.3 lb

## 2015-04-07 DIAGNOSIS — E119 Type 2 diabetes mellitus without complications: Secondary | ICD-10-CM | POA: Diagnosis not present

## 2015-04-07 DIAGNOSIS — C786 Secondary malignant neoplasm of retroperitoneum and peritoneum: Secondary | ICD-10-CM | POA: Diagnosis not present

## 2015-04-07 DIAGNOSIS — J90 Pleural effusion, not elsewhere classified: Secondary | ICD-10-CM

## 2015-04-07 DIAGNOSIS — C801 Malignant (primary) neoplasm, unspecified: Secondary | ICD-10-CM

## 2015-04-07 DIAGNOSIS — D509 Iron deficiency anemia, unspecified: Secondary | ICD-10-CM | POA: Diagnosis not present

## 2015-04-07 DIAGNOSIS — C569 Malignant neoplasm of unspecified ovary: Secondary | ICD-10-CM | POA: Insufficient documentation

## 2015-04-07 LAB — COMPREHENSIVE METABOLIC PANEL (CC13)
ALBUMIN: 3.5 g/dL (ref 3.5–5.0)
ALK PHOS: 89 U/L (ref 40–150)
ALT: 9 U/L (ref 0–55)
ANION GAP: 9 meq/L (ref 3–11)
AST: 17 U/L (ref 5–34)
BILIRUBIN TOTAL: 0.47 mg/dL (ref 0.20–1.20)
BUN: 12.4 mg/dL (ref 7.0–26.0)
CALCIUM: 9.4 mg/dL (ref 8.4–10.4)
CO2: 25 meq/L (ref 22–29)
Chloride: 103 mEq/L (ref 98–109)
Creatinine: 0.8 mg/dL (ref 0.6–1.1)
EGFR: 82 mL/min/{1.73_m2} — AB (ref >90)
Glucose: 109 mg/dl (ref 70–140)
Potassium: 4.4 mEq/L (ref 3.5–5.1)
SODIUM: 138 meq/L (ref 136–145)
Total Protein: 7.9 g/dL (ref 6.4–8.3)

## 2015-04-07 LAB — CA 125: CA 125: 1142 U/mL — AB (ref <35)

## 2015-04-07 LAB — IRON AND TIBC CHCC
%SAT: 6 % — AB (ref 21–57)
Iron: 20 ug/dL — ABNORMAL LOW (ref 41–142)
TIBC: 315 ug/dL (ref 236–444)
UIBC: 295 ug/dL (ref 120–384)

## 2015-04-07 LAB — CBC WITH DIFFERENTIAL/PLATELET
BASO%: 0.3 % (ref 0.0–2.0)
Basophils Absolute: 0 10*3/uL (ref 0.0–0.1)
EOS%: 0.7 % (ref 0.0–7.0)
Eosinophils Absolute: 0.1 10*3/uL (ref 0.0–0.5)
HCT: 31.5 % — ABNORMAL LOW (ref 34.8–46.6)
HEMOGLOBIN: 9.9 g/dL — AB (ref 11.6–15.9)
LYMPH%: 11.3 % — AB (ref 14.0–49.7)
MCH: 23.9 pg — ABNORMAL LOW (ref 25.1–34.0)
MCHC: 31.4 g/dL — ABNORMAL LOW (ref 31.5–36.0)
MCV: 75.9 fL — AB (ref 79.5–101.0)
MONO#: 0.4 10*3/uL (ref 0.1–0.9)
MONO%: 6.4 % (ref 0.0–14.0)
NEUT%: 81.3 % — AB (ref 38.4–76.8)
NEUTROS ABS: 5.5 10*3/uL (ref 1.5–6.5)
PLATELETS: 366 10*3/uL (ref 145–400)
RBC: 4.15 10*6/uL (ref 3.70–5.45)
RDW: 14.6 % — ABNORMAL HIGH (ref 11.2–14.5)
WBC: 6.7 10*3/uL (ref 3.9–10.3)
lymph#: 0.8 10*3/uL — ABNORMAL LOW (ref 0.9–3.3)

## 2015-04-07 LAB — FERRITIN CHCC: Ferritin: 140 ng/ml (ref 9–269)

## 2015-04-07 MED ORDER — FERROUS FUMARATE 325 (106 FE) MG PO TABS: ORAL_TABLET | ORAL | Status: DC

## 2015-04-07 MED ORDER — LORAZEPAM 0.5 MG PO TABS
ORAL_TABLET | ORAL | Status: DC
Start: 2015-04-07 — End: 2016-01-08

## 2015-04-07 MED ORDER — ONDANSETRON HCL 8 MG PO TABS: 8.0000 mg | ORAL_TABLET | Freq: Three times a day (TID) | ORAL | Status: DC | PRN

## 2015-04-07 MED ORDER — DEXAMETHASONE 4 MG PO TABS: ORAL_TABLET | ORAL | Status: DC

## 2015-04-07 MED ORDER — LIDOCAINE HCL (PF) 1 % IJ SOLN
INTRAMUSCULAR | Status: AC
  Filled 2015-04-07: qty 10

## 2015-04-07 NOTE — Patient Instructions (Addendum)
We will send prescriptions to your pharmacy  First chemo will be 04-13-15 at 10:15  1.decadron (dexamethasone, steroid) 4 mg. Take five tablets +(=20 mg) with food 12 hrs before taxol chemotherapy and five tablets with food 6 hrs before taxol. You should take first dose at ~ 10 PM on Wed 7-27 and second dose at ~ 4 :00 AM on Thurs 7-28.   2.zofran (ondansetron) 8mg  One tablet every 8 hrs as needed for nausea. Will not make you drowsy. Fine to take one tablet AM after chemo whether or not any nausea then, to extend coverage for nausea a bit longer. Other than that dose, fine to take just as needed for nausea   3.ativan (lorazepam) 0.5 mg. One tablet swallow or dissolve under tongue every 6 hrs as needed for nausea. WIll make you drowsy and a little forgetful around each dose. Fine to take one tablet at bedtime night of chemo whether or not any nausea.    I would also like you to start some iron. We will use either name brand Hemocyte if your insurance will cover that,  or over the counter ferrous fumarate if your insurance will not cover the name brand. Best to absorb iron if it is taken away from meals with orange juice or with vitamin C tablet (if you can tolerate that)  You can call any time if needed [phone]

## 2015-04-07 NOTE — Telephone Encounter (Signed)
Gave avs & calendar for July/August. °

## 2015-04-07 NOTE — Procedures (Signed)
  US guided Rt thora 1.3 liters bloody fluid Sent for labs  cxr pending

## 2015-04-07 NOTE — Progress Notes (Signed)
[facility] NEW PATIENT EVALUATION   Name: [name] Date: April 07, 2015  MRN: [id number] DOB: 02/10/1951  REFERRING PHYSICIAN: D.ClarkePearson  cc Lucille Passy, MD, B.Hoxworth, Jenell Milliner), [facility] ophthalmology   REASON FOR REFERRAL: advanced gyn carcinoma, for neoadjuvant chemotherapy   HISTORY OF PRESENT ILLNESS:[name] is a 64 y.o. female who is seen in consultation, together with husband and son, at the request of Dr Josephina Shih, for reason as above.  Patient had been in usual health, including fairly well controlled DM and previous gastric banding for obesity, until fairly recent increased regurgitation, SOB and abdominal fullness. She saw PCP with the respiratory complaints, with CXR 03-27-15 showing small right pleural effusion. CT chest 03-29-15 had small to moderate right pleural effusion with associated atelectasis, abdominal ascites and stranding in omentum. CT AP 03-31-15 showed diffuse carcinomatosis, small ascites, implants at diaphragm, 3.4 cm mass left anterior mid abdomen and 4 cm midline lower pelvis, adnexal mass 8.8 x 10.7 x 12.3 cm, no hepatic lesions, no bowel obstruction, kidneys not remarkable (reports below). 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 He did not feel that this could be optimally debulked at present and recommended initial treatment with chemotherapy. She had CT core needle biopsy of LUQ peritoneal implant on 04-04-15, with pathology [id number] showing poorly differentiated carcinoma with marked anaplasia and frequent mitotic figures, immunohistochemistry most consistent with gyn primary, specifically ovarian.  Patient and husband attended chemotherapy education class prior to this visit.   Patient notices progressive SOB with exertion "just getting out of car" , no cough or chest pain. She is not SOB at rest. She has abdominal  fullness without actual pain, more comfortable reclining than sitting up. She has early satiety moreso than her usual with the gastric banding, still the regurgitation but no frank nausea or vomiting; she has chronic GERD which seems different than this regurgitation. Bowels are moving generally daily, occasionally uses stool softener, slightly loose today. She is eating some, including hot dog, slaw, okra, macaroni yesterday, and drinking water and tea. Bladder ok. No LE swelling.  REVIEW OF SYSTEMS as above, also: Weight down ~ 9 lbs in last 2 months, with decreased po intake. No HA. Little vision in right eye since retinal detatchment. No sinus symptoms, good hearing. "needs some fillings" but no active dental complaints, does not have dentist since previous provider retired. No thyroid problems. Regular mammograms. Some arthritis in hands. No bleeding. No hx blood clots. No peripheral neuropathy. Peripheral IV access has been easily accomplished. She is not aware of anemia in past  Remainder of full 10 point review of systems negative.   ALLERGIES: Aspirin and Codeine  PAST MEDICAL/ SURGICAL HISTORY:    Abdominal hysterectomy for menorrhagia [facility] 1987 CHF/ chronic diastolic heart failure in EMR, tho patient and family do not feel this has been an active problem. 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 Hgb A1c 6.6 on 01-09-15. Per patient usual fasting BS <100. Sees Dr Deborra Medina ~ q 6 months. Left retinal detatchment 07-2008 Asthma in childhood, flares with respiratory infections only Surgery for left knee meniscus remote Gastric banding by Dr Excell Seltzer 2011 Benign breast lumpect[facility] 01-12-15, no findings of concern Colonoscopy by Dr Delfin Edis ~ 2014, 10 year follow up recommended   CURRENT MEDICATIONS: reviewed  as listed now in EMR. Prescriptions to be sent for decadron 20 mg with food 12 hrs and  6 hrs prior to taxol, zofran 8 mg q 8 hrs prn nausea, ativan 0.5 mg q 6 hrs prn nausea. Hemocyte or ferrous fumarate.  [place]   SOCIAL HISTORY:  In [place] since childhood, lives with husband and one son in [place]. Worked as school Recruitment consultant. Second hand smoke exposure from husband, but no personal smoking. No ETOH. Husband disabled. Other son married, lives locally, one granddaughter age 86  FAMILY HISTORY:  Mother breast cancer age 47 Sister breast cancer age 66 Niece age 102 bilateral breast ca and ovarian ca Maternal aunt breast ca age 54 Maternal uncle breast cancer Maternal grandmother breast cancer age 46 2 maternal great uncles died with breast cancer  Patient tells me that sister and niece have had genetics testing and have mutation, tho she does not know specifics. Their testing may have been done at [facility] Patient and other family members have not had testing.          PHYSICAL EXAM:  height is _0  (1.702 m) and weight is 218 lb 4.8 oz (99.02 kg). Her oral temperature is 98.3 F (36.8 C). Her blood pressure is 149/52 and her pulse is 100. Her respiration is 18 and oxygen saturation is 97%.  Alert, pleasant, cooperative lady,obese, looks stated age, very pleasant. Respirations not labored with activity in exam room, ambulatory without assistance,, needs some help on and off exam table.  Does not appear to be in  discomfort. Good historian. Husband and son supportive.   HEENT: normal hair pattern. PERRL, not icteric.Fair dentition, no obvious acute issues. Oral mucosa moist and clear. Neck supple without JVD or thyroid mass.  RESPIRATORY: Dullness to percussion and absent BS 1/2 way up right chest, otherwise clear bilaterally. No use of accessory muscles  CARDIAC/ VASCULAR: heart RRR, no gallop, clear heart sounts. Peripheral pulses intact and symmetrical  ABDOMEN: obese, soft, not obviously distended. Gastric band apparatus palpable as firm  area right upper mid abdomen. Bowel sounds present. Cannot appreciate HSM or mass. Area of core needle biopsy without drainage or erytnema, bandaid removed.  LYMPH NODES: no cervical, supraclavicular, axillary or inguinal adenopathy apparent  BREASTS: bilaterally with some minimal irregularities, no dominant mass, no skin or nipple findings of concern  NEUROLOGIC: CN, motor, sensory, cerebellar nonfocal. PSYCH appropriate mood and affect  SKIN: some patchy hypopigmentation on arms bilaterally, no rash, ecchymosis, petechiae  MUSCULOSKELETAL:back not tender. Extremities without swelling, edema, cords, tenderness    LABORATORY DATA:  Results for orders placed or performed in visit on 04/07/15 (from the past 48 hour(s))  CBC with Differential     Status: Abnormal   Collection Time: 04/07/15  9:36 AM  Result Value Ref Range   WBC 6.7 3.9 - 10.3 10e3/uL   NEUT# 5.5 1.5 - 6.5 10e3/uL   HGB 9.9 (L) 11.6 - 15.9 g/dL   HCT 31.5 (L) 34.8 - 46.6 %   Platelets 366 145 - 400 10e3/uL   MCV 75.9 (L) 79.5 - 101.0 fL   MCH 23.9 (L) 25.1 - 34.0 pg   MCHC 31.4 (L) 31.5 - 36.0 g/dL   RBC 4.15 3.70 - 5.45 10e6/uL   RDW 14.6 (H) 11.2 - 14.5 %   lymph# 0.8 (L) 0.9 - 3.3 10e3/uL   MONO# 0.4 0.1 - 0.9 10e3/uL   Eosinophils Absolute 0.1 0.0 - 0.5 10e3/uL   Basophils Absolute  0.0 0.0 - 0.1 10e3/uL   NEUT% 81.3 (H) 38.4 - 76.8 %   LYMPH% 11.3 (L) 14.0 - 49.7 %   MONO% 6.4 0.0 - 14.0 %   EOS% 0.7 0.0 - 7.0 %   BASO% 0.3 0.0 - 2.0 %  Iron and TIBC [facility]     Status: Abnormal   Collection Time: 04/07/15  9:36 AM  Result Value Ref Range   Iron 20 (L) 41 - 142 ug/dL   TIBC 315 236 - 444 ug/dL   UIBC 295 120 - 384 ug/dL   %SAT 6 (L) 21 - 57 %  Ferritin     Status: None   Collection Time: 04/07/15  9:36 AM  Result Value Ref Range   Ferritin 140 9 - 269 ng/ml  Comprehensive metabolic panel (Cmet) - [facility]     Status: Abnormal   Collection Time: 04/07/15  9:37 AM  Result Value Ref Range   Sodium 138 136 -  145 mEq/L   Potassium 4.4 3.5 - 5.1 mEq/L   Chloride 103 98 - 109 mEq/L   CO2 25 22 - 29 mEq/L   Glucose 109 70 - 140 mg/dl   BUN 12.4 7.0 - 26.0 mg/dL   Creatinine 0.8 0.6 - 1.1 mg/dL   Total Bilirubin 0.47 0.20 - 1.20 mg/dL   Alkaline Phosphatase 89 40 - 150 U/L   AST 17 5 - 34 U/L   ALT 9 0 - 55 U/L   Total Protein 7.9 6.4 - 8.3 g/dL   Albumin 3.5 3.5 - 5.0 g/dL   Calcium 9.4 8.4 - 10.4 mg/dL   Anion Gap 9 3 - 11 mEq/L   EGFR 82 (L) >90 ml/min/1.73 m2    Comment: eGFR is calculated using the CKD-EPI Creatinine Equation (2009)  CA 125     Status: Abnormal   Collection Time: 04/07/15  9:37 AM  Result Value Ref Range   CA 125 1142 (H) <35 U/mL    Comment:  This test was performed using the [place] chemiluminescentmethod.  Values obtained from different assay methods cannot be usedinterchangeably.  CA125 levels , regardless of value, should not beinterpreted as absolute evidence of the presence or  absence ofdisease.     CBC only was available at time of visit. We will be in touch with patient with results of other labs as above.  PATHOLOGY: [name], [name] Collected: 04/04/2015 Client: [facility] Accession: [id number] HOLOGY FINAL DIAGNOSIS Diagnosis Peritoneum, biopsy - POORLY DIFFERENTIATED CARCINOMA. Microscopic Comment There is poorly differentiated carcinoma characterized by nests and sheets of tumor cells with marked anaplasia and frequent mitotic figures. Immunohistochemistry shows positivity with cytokeratin 7, cytokeratin 8/18, estrogen receptor, MOC-31, p16 and nuclear positivity with WT-1. The tumor is negative with CDX2, CD10, cytokeratin 20, gross cystic disease fluid protein, Napsin-A- progesterone receptor, S-100 and thyroid transcription factor-1. The immunophenotype and morphology are most consistent with a gynecologic primary, most likely ovarian.  RADIOGRAPHY:  US thoracentesis for 1.3 liters bloody fluid from right, and follow up  CXR done after visit today  Dg Chest 1 View  04/07/2015   CLINICAL DATA:  Status post thoracentesis  EXAM: CHEST  1 VIEW  COMPARISON:  March 27, 2015 chest radiograph and chest CT March 29, 2015  FINDINGS: There is elevation of the right hemidiaphragm with small residual effusion. There are apparent skin folds on the right, but there is no discernible pneumothorax. The left lung is clear. The heart size and pulmonary vascularity are  normal. No adenopathy. No bone lesions.  IMPRESSION: No demonstrable pneumothorax. Apparent skin folds are present on the right. There is a persistent small effusion on the right, smaller than on prior studies. There is persistent elevation of the right hemidiaphragm. Lungs elsewhere clear. Heart size within normal limits.   Electronically Signed   By: Lowella Grip III M.D.   On: 04/07/2015 15:21   US THORACENTESIS ASP PLEURAL SPACE W/IMG GUIDE  COMPARISON: None.  MEDICATIONS: 10 cc 1% lidocaine  COMPLICATIONS: None immediate  TECHNIQUE: Informed written consent was obtained from the patient after a discussion of the risks, benefits and alternatives to treatment. A timeout was performed prior to the initiation of the procedure.  Initial ultrasound scanning demonstrates a R pleural effusion. The lower chest was prepped and draped in the usual sterile fashion. 1% lidocaine was used for local anesthesia.  Under direct ultrasound guidance, a 19 gauge, 7-cm, Yueh catheter was introduced. An ultrasound image was saved for documentation purposes. The thoracentesis was performed. The catheter was removed and a dressing was applied. The patient tolerated the procedure well without immediate post procedural complication. The patient was escorted to have an upright chest radiograph.  FINDINGS: A total of approximately 1.3 liters of bloody fluid was removed. Requested samples were sent to the laboratory.  IMPRESSION: Successful ultrasound-guided R  sided thoracentesis yielding 1.3 liters of pleural fluid.    PRIOR STUDIES:  EXAM: CT-GUIDED BIOPSY OF A LEFT UPPER QUADRANT PERITONEAL IMPLANT. CORE. 04-04-15  MEDICATIONS AND MEDICAL HISTORY: Versed 1 mg, Fentanyl 50 mcg.  Additional Medications: None.  ANESTHESIA/SEDATION: Moderate sedation time: 10 minutes  PROCEDURE: The procedure, risks, benefits, and alternatives were explained to the patient. Questions regarding the procedure were encouraged and answered. The patient understands and consents to the procedure.  The left upper quadrant was prepped with Betadine in a sterile fashion, and a sterile drape was applied covering the operative field. A sterile gown and sterile gloves were used for the procedure.  Under CT guidance, a(n) 17 gauge guide needle was advanced into the left upper quadrant anterior peritoneal implant. Subsequently four 18 gauge core biopsies were obtained. The guide needle was removed. Final imaging was performed.  Patient tolerated the procedure well without complication. Vital sign monitoring by nursing staff during the procedure will continue as patient is in the special procedures unit for post procedure observation.  FINDINGS: The images document guide needle placement within the peritoneal implant. Post biopsy images demonstrate no hemorrhage.  COMPLICATIONS: None  IMPRESSION: Successful CT-guided core biopsy of a peritoneal implant.    EXAM: CT ABDOMEN AND PELVIS WITH CONTRAST  03-31-15  TECHNIQUE: Multidetector CT imaging of the abdomen and pelvis was performed using the standard protocol following bolus administration of intravenous contrast.  CONTRAST: 168m OMNIPAQUE IOHEXOL 300 MG/ML SOLN  COMPARISON: CT of the chest on 03/29/2015  FINDINGS: Stable to slightly larger moderate right pleural effusion at the right lung base causing compressive atelectasis of the right lower lobe.  There is a small  amount of ascites scattered throughout the peritoneal cavity. There is extensive tumor throughout the peritoneal cavity with diffuse carcinomatosis present as well as enhancing solid tumor implants abutting the diaphragm in the right perihepatic space as well as solid tumor implants in the omentum. The largest tumor implants are a 3.4 cm mass in the left anterior mid abdomen just deep to the abdominal wall and a 4 cm implant in the midline lower anterior pelvis just deep to the abdominal wall.  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 This mass most likely originates from the right ovary based on positioning but could also potentially represent a uterine malignancy. The mass demonstrates solid tissue as well as cystic areas. There are some scattered lymph nodes throughout the mesentery. No enlarged retroperitoneal or inguinal lymph nodes are seen.  The liver shows no focal lesions. The gallbladder, pancreas, spleen, adrenal glands and kidneys are unremarkable. No bony lesions are seen. No vascular abnormalities. No evidence of bowel obstruction or perforation. A gastric band shows normal positioning with intact tubing to a subcutaneous port in the right lower abdominal wall. Bony structures show degenerative disc disease of the lumbar spine, most prominently at L5-S1. There is no evidence of bone metastases.  IMPRESSION: 1. Stable to slightly larger right pleural effusion. 2. 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 This likely originates from the right ovary but could also potentially represent uterine malignancy. There is  an associated small amount of ascites.   EXAM: CT CHEST WITHOUT CONTRAST 03-29-15  TECHNIQUE: Multidetector CT imaging of the chest was performed following the standard protocol without IV contrast.  COMPARISON: Chest radiograph on 03/27/2015  FINDINGS: Mediastinum/Lymph Nodes: No masses or pathologically enlarged lymph nodes identified on this unenhanced exam.  Lungs/Pleura: A small to moderate right-sided pleural effusion is seen. Compressive atelectasis is seen in the dependent right lower lobe. No evidence of pulmonary consolidation or mass.  Musculoskeletal/Soft Tissues: No suspicious bone lesions or other significant chest wall abnormality.  Upper Abdomen: Gastric lap band seen in expected position. Abdominal ascites noted, as well as soft tissue stranding within the greater omentum. Peritoneal carcinomatosis cannot be excluded.  IMPRESSION: Small to moderate right pleural effusion with compressive right lower lobe atelectasis. No radiographic evidence of pulmonary neoplasm or consolidation.  Abdominal ascites, with stranding seen in the greater omentum. Peritoneal carcinomatosis cannot be excluded. Consider abdomen pelvis CT with contrast for further evaluation.  EXAM: CHEST 2 VIEW  03-27-15  COMPARISON: 08/05/2014  FINDINGS: Cardiomediastinal silhouette is stable. There is small right pleural effusion with right basilar atelectasis or infiltrate. No pulmonary edema. Bony thorax is stable.  IMPRESSION: Small right pleural effusion with right basilar atelectasis or infiltrate.    PACs images reviewed by MD for this visit; patient and family had seen images during consultation with Dr Josephina Shih and did not wish to review these at time of this visit.    DISCUSSION: All of history above reviewed and discussed, including circumstances surrounding presentation, information from scans, and the pathology information confirming clinical diagnosis  of gyn/ likely ovarian cancer. We have discussed recommendation for neoadjuvant chemotherapy, which hopefully will improve situation enough to allow optimal surgery; she understands that we will reevaluate after ~ 3 cycles of the chemotherapy. We have discussed choice of chemotherapy with taxol and carboplatin, and discussed fact that rapidly growing and dividing malignancy may have better response to chemotherapy than more slowly growing disease. We have discussed q 3 week vs dose dense taxol [place]; she is in agreement with q 3 week dosing. She prefers peripheral IV access, which does appear adequate at least to begin treatment per RN evaluation now. We have reviewed outpatient administration  of the chemotherapy and have specifically discussed side effects including nausea/ vomiting, taste changes, drop in blood counts, constipation, taxol aches, peripheral neuropathy, increase in blood sugars with steroid premedication for taxol. We have discussed antiemetics. She would like to begin treatment as soon as possible; per [facility] financial staff can get preauthorization prior to usual 5 day wait.  With progressive SOB, I have also been able to arrange for US thoracentesis at [facility] after today's visit, cytology requested. Genetics counseling discussed and requested. Verbal consent obtained from patient for chemotherapy.    IMPRESSION / PLAN:   1. 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 She will see APP on 04-17-15 to be sure nausea, constipation, taxol aches managed adequately. Counts will not be at nadir by 04-17-15, so if [place] <=2.5 would begin granix 300 daily x 3 days (preauth requested now). If counts are ok on 04-17-15, consider recheck on ~ 8-5, same parameters if so. I will see her on 04-24-15 with labs.  2.Symptomatic right pleural effusion: appreciate assistance from IR with therapeutic and diagnostic US  thoracentesis today. We will let her know cytology information by phone. They understand that this may be malignant and that chemotherapy would not change if so 3.Type 2 diabetes: managed by PCP. Expect blood sugars will be higher transiently with steroids around taxol. Will cover during treatment with SS insulin and additional IVF if needed.  4. Strong family history of breast cancer and ovarian cancer, sister and niece have had genetics testing/ may be BRCA +; at least 69 males in 2 generations with breast cancer. Genetics referral. 5.HTN, remotely saw [facility] cardiology, apparently no active CHF concerns 6.morbid obesity, post gastric banding 7. Iron deficiency anemia: source of blood loss not clear, tho history of menorrhagia prior to hysterectomy. Colonoscopy reportedly unremarkable ~ 2014. Begin oral iron, may need IV. 8.post hysterectomy for benign indications 1987 9.orthopedic procedure left knee remotely 10. Retinal detatchment left 2009 with minimal vision on that side. 11. Hx asthma 12.Information on advance directives given.  Patient and accompanying individuals have had questions answered to their satisfaction and are in agreement with plan above. They can contact this office for questions or concerns at any time prior to next scheduled visit.   Chemo orders entered with SS insulin coverage to be based on CBG at [facility] at time of chemo. Granix entered with parameters. Preauthorization obtained for chemo; preauth requested for granix. RN to follow up by phone prior to first chemo 7-28, to review meds.  Cc Drs Deborra Medina, ClarkePearson Time spent  90 min , including >50% discussion and coordination of care.    Gordy Levan, MD 04/07/2015 3:46 PM

## 2015-04-11 ENCOUNTER — Telehealth: Payer: Self-pay | Admitting: Oncology

## 2015-04-11 ENCOUNTER — Other Ambulatory Visit: Payer: Self-pay | Admitting: Oncology

## 2015-04-11 ENCOUNTER — Telehealth: Payer: Self-pay | Admitting: *Deleted

## 2015-04-11 DIAGNOSIS — J948 Other specified pleural conditions: Secondary | ICD-10-CM

## 2015-04-11 NOTE — Telephone Encounter (Signed)
E-Prescription was not received by pharmacy. RN phone-in prescription. Patient family member notified and verbalized understanding.

## 2015-04-11 NOTE — Telephone Encounter (Signed)
Medical Oncology  Spoke with patient by phone to see how she did with thoracentesis on 7-22 and to let her know cytology information. She tolerated the thoracentesis without difficulty and it helped breathing quite a bit, tho she notices that this is getting a little more symptomatic. She understands that she can certainly have another thoracentesis by IR if symptoms / fluid is significant, but would not repeat unless enough to make a difference. I told her that cytology has some atypical cells but cannot absolutely tell that these are cancer cells; this does not change our plans to begin chemo on 04-13-15.  Patient understands that she should let us know if increased SOB or other problems. RN to review steroids and antiemetics with her before 7-28 Rx.  Patient appreciated call.  Godfrey Pick, MD

## 2015-04-12 ENCOUNTER — Ambulatory Visit (HOSPITAL_COMMUNITY)
Admission: RE | Admit: 2015-04-12 | Discharge: 2015-04-12 | Disposition: A | Discharge status: Home / Self Care | Payer: Medicare Other | Source: Ambulatory Visit | Attending: Radiology | Admitting: Radiology

## 2015-04-12 ENCOUNTER — Ambulatory Visit (HOSPITAL_COMMUNITY)
Admission: RE | Admit: 2015-04-12 | Discharge: 2015-04-12 | Disposition: A | Discharge status: Home / Self Care | Payer: Medicare Other | Source: Ambulatory Visit | Attending: Oncology | Admitting: Oncology

## 2015-04-12 ENCOUNTER — Telehealth: Payer: Self-pay

## 2015-04-12 DIAGNOSIS — J90 Pleural effusion, not elsewhere classified: Secondary | ICD-10-CM | POA: Diagnosis present

## 2015-04-12 DIAGNOSIS — Z9889 Other specified postprocedural states: Secondary | ICD-10-CM

## 2015-04-12 DIAGNOSIS — J948 Other specified pleural conditions: Secondary | ICD-10-CM

## 2015-04-12 NOTE — Procedures (Signed)
Successful US guided right thoracentesis. Yielded 1.6 liters of bloody fluid. Pt tolerated procedure well. No immediate complications.  Specimen was sent for labs. CXR ordered.  Tsosie Billing D PA-C 04/12/2015 12:22 PM

## 2015-04-12 NOTE — Telephone Encounter (Signed)
Reviewed the directions noted below with [name] and her husband.   All questions answered and Patient and husband verbalized understanding and appreciative of call.

## 2015-04-12 NOTE — Telephone Encounter (Signed)
Reviewed the results noted below regarding CA-125 level and iron.

## 2015-04-12 NOTE — Telephone Encounter (Signed)
Ferritin (Order [id number])      Ferritin  Status: Finalresult Visible to patient:  Not Released Nextappt: 04/13/2015 at 09:30 AM in Oncology ([facility]) Dx:  Microcytic anemia       Notes Recorded by Lennis P Livesay, MD on 04/07/2015 at 2:19 PM Labs seen and need follow up: next time RN talks with her, please let her know CA 125 is elevated, as is frequently the case with gyn cancers, at 1142. She is also very iron deficient, so needs to be on the oral iron as we discussed; depending on how she does with oral iron, we could consider giving her the iron IV. Chemistries including kidney function and liver chemistries are good     Ref Range 5d ago    Ferritin 9 - 269 ng/ml 140   Resulting Agency [facility]     Narrative     Performed At: [facility]        [address]        [place], [place] [address]       Specimen Collected: 04/07/15 9:36 AM Last Resulted: 04/07/15 11:02 AM                      Other Results from 04/07/2015         CA 125  Status: Finalresult Visible to patient:  Not Released Nextappt: 04/13/2015 at 09:30 AM in Oncology ([facility]) Dx:  Peritoneal carcinomatosis          Notes Recorded by Lennis P Livesay, MD on 04/07/2015 at 2:19 PM Labs seen and need follow up: next time RN talks with her, please let her know CA 125 is elevated, as is frequently the case with gyn cancers, at 1142. She is also very iron deficient, so needs to be on the oral iron as we discussed; depending on how she does with oral iron, we could consider giving her the iron IV. Chemistries including kidney function and liver chemistries are good     Ref Range 5d ago    CA 125 <35 U/mL 1142 (H)   Comments: This test was performed using the Beckman Coulter chemiluminescentmethod. Values obtained from different assay methods cannot be usedinterchangeably. CA125 levels , regardless of  value, should not beinterpreted as absolute evidence of the presence or  absence ofdisease.     Resulting Agency [facility]     Narrative     Performed at: [facility]        [address], [address]        [place], [place] [address]       Specimen Collected: 04/07/15 9:37 AM Last Resulted: 04/07/15 1:04 PM                    Comprehensive metabolic panel (Cmet) - [facility]  Status: Finalresult Visible to patient:  Not Released Nextappt: 04/13/2015 at 09:30 AM in Oncology ([facility]) Dx:  Peritoneal carcinomatosis          Notes Recorded by Lennis P Livesay, MD on 04/07/2015 at 2:19 PM Labs seen and need follow up: next time RN talks with her, please let her know CA 125 is elevated, as is frequently the case with gyn cancers, at 1142. She is also very iron deficient, so needs to be on the oral iron as we discussed; depending on how she does with oral iron, we could consider giving her the iron IV. Chemistries including   kidney function and liver chemistries are good       Ref Range 5d ago  2wk ago  3mo ago  8mo ago     Sodium 136 - 145 mEq/L 138 136R 133 (L)R 137R    Potassium 3.5 - 5.1 mEq/L 4.4 4.2 4.5 4.4R    Chloride 98 - 109 mEq/L 103 100R 101R 99R    CO2 22 - 29 mEq/L 25 30R 28R 26R    Glucose 70 - 140 mg/dl 109 93R 136 (H)R 90R    BUN 7.0 - 26.0 mg/dL 12.4 11R 12R 11R    Creatinine 0.6 - 1.1 mg/dL 0.8 0.81R 0.79R 0.64R    Total Bilirubin 0.20 - 1.20 mg/dL 0.47 0.3R      Alkaline Phosphatase 40 - 150 U/L 89 81R      AST 5 - 34 U/L 17 14R      ALT 0 - 55 U/L 9 9R      Total Protein 6.4 - 8.3 g/dL 7.9 8.2R      Albumin 3.5 - 5.0 g/dL 3.5 3.9R      Calcium 8.4 - 10.4 mg/dL 9.4 9.5R 9.1R 9.3R    Anion Gap 3 - 11 mEq/L 9   12R    EGFR >90 ml/min/1.73 m2 82 (L)      Comments: eGFR is calculated using the CKD-EPI Creatinine Equation (2009)   Resulting Agency  [facility] [place] [facility] [place]  [facility]    Narrative     Performed At: [facility]        [address]        [place], [place] [address]       Specimen Collected: 04/07/15 9:37 AM Last Resulted: 04/07/15 10:14 AM             R=Reference range differs from displayed range         CBC with Differential  Status: Finalresult Visible to patient:  Not Released Nextappt: 04/13/2015 at 09:30 AM in Oncology ([facility]) Dx:  Peritoneal carcinomatosis          Notes Recorded by Lennis P Livesay, MD on 04/07/2015 at 2:19 PM Labs seen and need follow up: next time RN talks with her, please let her know CA 125 is elevated, as is frequently the case with gyn cancers, at 1142. She is also very iron deficient, so needs to be on the oral iron as we discussed; depending on how she does with oral iron, we could consider giving her the iron IV. Chemistries including kidney function and liver chemistries are good       Ref Range 5d ago  8d ago  2wk ago  8mo ago     WBC 3.9 - 10.3 10e3/uL 6.7 6.0R 6.1R 6.1R    NEUT# 1.5 - 6.5 10e3/uL 5.5  4.8R     HGB 11.6 - 15.9 g/dL 9.9 (L) 9.8 (L)R 10.1 (L)R 10.9 (L)R    HCT 34.8 - 46.6 % 31.5 (L) 31.8 (L)R 31.3 (L)R 33.8 (L)R    Platelets 145 - 400 10e3/uL 366 371R 402.0 (H)R 303R    MCV 79.5 - 101.0 fL 75.9 (L) 76.1 (L)R 75.6 (L)R 78.6R    MCH 25.1 - 34.0 pg 23.9 (L) 23.4 (L)R  25.3 (L)R    MCHC 31.5 - 36.0 g/dL 31.4 (L) 30.8R 32.2R 32.2R    RBC 3.70 - 5.45 10e6/uL 4.15 4.18R 4.14R 4.30R    RDW 11.2 - 14.5 % 14.6 (H)   14.4R 15.4R 13.7R    lymph# 0.9 - 3.3 10e3/uL 0.8 (L)       MONO# 0.1 - 0.9 10e3/uL 0.4       Eosinophils Absolute 0.0 - 0.5 10e3/uL 0.1  0.0R     Basophils Absolute 0.0 - 0.1 10e3/uL 0.0  0.0R     NEUT% 38.4 - 76.8 % 81.3 (H)       LYMPH% 14.0 - 49.7 % 11.3 (L)       MONO% 0.0 - 14.0 % 6.4       EOS% 0.0 - 7.0 % 0.7       BASO% 0.0 - 2.0 % 0.3      Resulting Agency  [facility]  [facility] [place] [facility]    Narrative     Performed At: [facility]        [address]        [place], [place] [address]       Specimen Collected: 04/07/15 9:36 AM Last Resulted: 04/07/15 9:48 AM             R=Reference range differs from displayed range         Iron and TIBC [facility]  Status: Finalresult Visible to patient:  Not Released Nextappt: 04/13/2015 at 09:30 AM in Oncology ([facility]) Dx:  Microcytic anemia          Notes Recorded by Lennis P Livesay, MD on 04/07/2015 at 2:19 PM Labs seen and need follow up: next time RN talks with her, please let her know CA 125 is elevated, as is frequently the case with gyn cancers, at 1142. She is also very iron deficient, so needs to be on the oral iron as we discussed; depending on how she does with oral iron, we could consider giving her the iron IV. Chemistries including kidney function and liver chemistries are good     Ref Range 5d ago    Iron 41 - 142 ug/dL 20 (L)   TIBC 236 - 444 ug/dL 315   UIBC 120 - 384 ug/dL 295   %SAT 21 - 57 % 6 (L)   Resulting Agency [facility]     Narrative     Performed At: [facility]        [address]        [place], [place] [address]       Specimen Collected: 04/07/15 9:36 AM Last Resulted: 04/07/15 11:02 AM                     Result Notes     Notes Recorded by Lennis P Livesay, MD on 04/07/2015 at 2:19 PM Labs seen and need follow up: next time RN talks with her, please let her know CA 125 is elevated, as is frequently the case with gyn cancers, at 1142. She is also very iron deficient, so needs to be on the oral iron as we discussed; depending on how she does with oral iron, we could consider giving her the iron IV. Chemistries including kidney function and liver chemistries are good         Reviewed by List     Lennis P Livesay, MD on 04/07/2015 2:19 PM      Encounter     View Encounter      Result Information     Status    Final result (04/07/2015 11:02 AM)    Provider Status: Reviewed          Lab Information     [facility]          Order-Level Documents:     There are no order-level documents.     View SmartLink Info     [facility]IN [facility] (Order #[id number]) on 04/07/15    Ferritin (Order [id number])  Lab  Order: [id number]   Released By: Charles L Hickerson  Authorizing: Lennis P Livesay, MD   Date: 04/07/2015  Department: [facility] Medical Oncology       Order Information     Order Date/Time Release Date/Time Start Date/Time End Date/Time    04/05/15 04:07 PM 04/07/15 09:34 AM 04/07/15 09:34 AM None      Order Details     Frequency Duration Priority Order Class    None None Routine Lab Collect      Acc#    [id number]      Associated Diagnoses       ICD-9-CM ICD-10-CM    Microcytic anemia    280.9 D50.9      Order History  Outpatient    Date/Time Action Taken User Additional Information    04/07/15 0934 Release Charles L Hickerson FromOrder:[id number]    04/07/15 1102 Result [facility] in [facility] Interface Final      Order Requisition     [facility] (Order #[id number]) on 04/07/15     Collection Information     Collected: 04/07/2015 9:36 AM   Resulting Agency: [facility]       View SmartLink Info     [facility] (Order #[id number]) on 04/07/15      

## 2015-04-12 NOTE — Telephone Encounter (Signed)
-----   Message from Gordy Levan, MD sent at 04/07/2015 12:31 PM EDT ----- RN please do scripts and call her day before chemo to be sure understands instructions.  First chemo will be 04-13-15 at 10:15  1.decadron (dexamethasone, steroid) 4 mg. Take five tablets +(=20 mg) with food 12 hrs before taxol chemotherapy and five tablets with food 6 hrs before taxol.  #10   2.zofran (ondansetron) 8mg  One tablet every 8 hrs as needed for nausea. Will not make you drowsy. Fine to take one tablet AM after chemo whether or not any nausea  #30  1 RF  3.ativan (lorazepam) 0.5 mg. One tablet swallow or dissolve under tongue every 6 hrs as needed for nausea. WIll make you drowsy. Fine to take one tablet at bedtime night of chemo whether or not any nausea.  #20  1 RF   I would also like you to start some iron. We will use either name brand Hemocyte if your insurance will cover that,  or over the counter ferrous fumarate if your insurance will not cover the name brand. Best to absorb iron if it is taken away from meals with orange juice or with vitamin C tablet (if you can tolerate that)   #30 if hemocyte, DAW if ferrous fumarate

## 2015-04-13 ENCOUNTER — Other Ambulatory Visit: Payer: Self-pay | Admitting: Nurse Practitioner

## 2015-04-13 ENCOUNTER — Other Ambulatory Visit (HOSPITAL_BASED_OUTPATIENT_CLINIC_OR_DEPARTMENT_OTHER): Payer: Medicare Other

## 2015-04-13 ENCOUNTER — Emergency Department (HOSPITAL_COMMUNITY)
Admission: EM | Admit: 2015-04-13 | Discharge: 2015-04-14 | Disposition: A | Discharge status: Home / Self Care | Payer: Medicare Other | Attending: Emergency Medicine | Admitting: Emergency Medicine

## 2015-04-13 ENCOUNTER — Ambulatory Visit (HOSPITAL_BASED_OUTPATIENT_CLINIC_OR_DEPARTMENT_OTHER): Payer: Medicare Other | Admitting: Nurse Practitioner

## 2015-04-13 ENCOUNTER — Encounter: Payer: Self-pay | Admitting: Nurse Practitioner

## 2015-04-13 ENCOUNTER — Encounter (HOSPITAL_COMMUNITY): Payer: Self-pay

## 2015-04-13 ENCOUNTER — Emergency Department (HOSPITAL_COMMUNITY): Payer: Medicare Other

## 2015-04-13 ENCOUNTER — Ambulatory Visit (HOSPITAL_BASED_OUTPATIENT_CLINIC_OR_DEPARTMENT_OTHER): Payer: Medicare Other

## 2015-04-13 ENCOUNTER — Other Ambulatory Visit: Payer: Self-pay

## 2015-04-13 VITALS — BP 129/45 | HR 98 | Temp 97.1°F | Resp 20

## 2015-04-13 DIAGNOSIS — E875 Hyperkalemia: Secondary | ICD-10-CM

## 2015-04-13 DIAGNOSIS — R739 Hyperglycemia, unspecified: Secondary | ICD-10-CM | POA: Insufficient documentation

## 2015-04-13 DIAGNOSIS — Z5111 Encounter for antineoplastic chemotherapy: Secondary | ICD-10-CM | POA: Diagnosis not present

## 2015-04-13 DIAGNOSIS — Z872 Personal history of diseases of the skin and subcutaneous tissue: Secondary | ICD-10-CM | POA: Diagnosis not present

## 2015-04-13 DIAGNOSIS — D649 Anemia, unspecified: Secondary | ICD-10-CM | POA: Insufficient documentation

## 2015-04-13 DIAGNOSIS — C786 Secondary malignant neoplasm of retroperitoneum and peritoneum: Secondary | ICD-10-CM | POA: Diagnosis not present

## 2015-04-13 DIAGNOSIS — C569 Malignant neoplasm of unspecified ovary: Secondary | ICD-10-CM | POA: Diagnosis not present

## 2015-04-13 DIAGNOSIS — R079 Chest pain, unspecified: Secondary | ICD-10-CM

## 2015-04-13 DIAGNOSIS — C801 Malignant (primary) neoplasm, unspecified: Secondary | ICD-10-CM

## 2015-04-13 DIAGNOSIS — Z79899 Other long term (current) drug therapy: Secondary | ICD-10-CM | POA: Diagnosis not present

## 2015-04-13 DIAGNOSIS — J189 Pneumonia, unspecified organism: Secondary | ICD-10-CM

## 2015-04-13 DIAGNOSIS — J159 Unspecified bacterial pneumonia: Secondary | ICD-10-CM | POA: Insufficient documentation

## 2015-04-13 DIAGNOSIS — F419 Anxiety disorder, unspecified: Secondary | ICD-10-CM | POA: Insufficient documentation

## 2015-04-13 DIAGNOSIS — E785 Hyperlipidemia, unspecified: Secondary | ICD-10-CM | POA: Insufficient documentation

## 2015-04-13 DIAGNOSIS — E119 Type 2 diabetes mellitus without complications: Secondary | ICD-10-CM | POA: Insufficient documentation

## 2015-04-13 DIAGNOSIS — Z8669 Personal history of other diseases of the nervous system and sense organs: Secondary | ICD-10-CM | POA: Diagnosis not present

## 2015-04-13 DIAGNOSIS — I5032 Chronic diastolic (congestive) heart failure: Secondary | ICD-10-CM | POA: Diagnosis not present

## 2015-04-13 DIAGNOSIS — I10 Essential (primary) hypertension: Secondary | ICD-10-CM | POA: Insufficient documentation

## 2015-04-13 DIAGNOSIS — F329 Major depressive disorder, single episode, unspecified: Secondary | ICD-10-CM | POA: Insufficient documentation

## 2015-04-13 DIAGNOSIS — Z794 Long term (current) use of insulin: Secondary | ICD-10-CM | POA: Diagnosis not present

## 2015-04-13 DIAGNOSIS — J90 Pleural effusion, not elsewhere classified: Secondary | ICD-10-CM | POA: Diagnosis not present

## 2015-04-13 DIAGNOSIS — J45901 Unspecified asthma with (acute) exacerbation: Secondary | ICD-10-CM | POA: Diagnosis not present

## 2015-04-13 LAB — WHOLE BLOOD GLUCOSE
Glucose: 307 mg/dL — ABNORMAL HIGH (ref 70–100)
Glucose: 345 mg/dL — ABNORMAL HIGH (ref 70–100)
HRS PC: 1.5 Hours
HRS PC: 0.5 h

## 2015-04-13 LAB — COMPREHENSIVE METABOLIC PANEL (CC13)
ALT: 9 U/L (ref 0–55)
AST: 13 U/L (ref 5–34)
Albumin: 3.3 g/dL — ABNORMAL LOW (ref 3.5–5.0)
Alkaline Phosphatase: 90 U/L (ref 40–150)
Anion Gap: 9 mEq/L (ref 3–11)
BUN: 18 mg/dL (ref 7.0–26.0)
CHLORIDE: 101 meq/L (ref 98–109)
CO2: 22 meq/L (ref 22–29)
Calcium: 9.2 mg/dL (ref 8.4–10.4)
Creatinine: 0.8 mg/dL (ref 0.6–1.1)
EGFR: 72 mL/min/{1.73_m2} — ABNORMAL LOW (ref >90)
Glucose: 331 mg/dl — ABNORMAL HIGH (ref 70–140)
POTASSIUM: 5.2 meq/L — AB (ref 3.5–5.1)
Sodium: 132 mEq/L — ABNORMAL LOW (ref 136–145)
Total Bilirubin: 0.29 mg/dL (ref 0.20–1.20)
Total Protein: 7.7 g/dL (ref 6.4–8.3)

## 2015-04-13 LAB — CBC WITH DIFFERENTIAL/PLATELET
BASO%: 0.2 % (ref 0.0–2.0)
Basophils Absolute: 0 10*3/uL (ref 0.0–0.1)
EOS ABS: 0 10*3/uL (ref 0.0–0.5)
EOS%: 0 % (ref 0.0–7.0)
HCT: 29.7 % — ABNORMAL LOW (ref 34.8–46.6)
HGB: 9.5 g/dL — ABNORMAL LOW (ref 11.6–15.9)
LYMPH%: 5.4 % — AB (ref 14.0–49.7)
MCH: 24.1 pg — ABNORMAL LOW (ref 25.1–34.0)
MCHC: 32 g/dL (ref 31.5–36.0)
MCV: 75.2 fL — ABNORMAL LOW (ref 79.5–101.0)
MONO#: 0 10*3/uL — ABNORMAL LOW (ref 0.1–0.9)
MONO%: 0.5 % (ref 0.0–14.0)
NEUT#: 5.4 10*3/uL (ref 1.5–6.5)
NEUT%: 93.9 % — AB (ref 38.4–76.8)
Platelets: 383 10*3/uL (ref 145–400)
RBC: 3.95 10*6/uL (ref 3.70–5.45)
RDW: 14.4 % (ref 11.2–14.5)
WBC: 5.8 10*3/uL (ref 3.9–10.3)
lymph#: 0.3 10*3/uL — ABNORMAL LOW (ref 0.9–3.3)

## 2015-04-13 LAB — BASIC METABOLIC PANEL
ANION GAP: 10 (ref 5–15)
BUN: 22 mg/dL — ABNORMAL HIGH (ref 6–20)
CO2: 21 mmol/L — ABNORMAL LOW (ref 22–32)
Calcium: 8.7 mg/dL — ABNORMAL LOW (ref 8.9–10.3)
Chloride: 99 mmol/L — ABNORMAL LOW (ref 101–111)
Creatinine, Ser: 0.92 mg/dL (ref 0.44–1.00)
GFR calc Af Amer: >60 mL/min (ref >60)
GLUCOSE: 330 mg/dL — AB (ref 65–99)
POTASSIUM: 4.5 mmol/L (ref 3.5–5.1)
Sodium: 130 mmol/L — ABNORMAL LOW (ref 135–145)

## 2015-04-13 LAB — CBC
HEMATOCRIT: 26.9 % — AB (ref 36.0–46.0)
Hemoglobin: 8.4 g/dL — ABNORMAL LOW (ref 12.0–15.0)
MCH: 23.8 pg — AB (ref 26.0–34.0)
MCHC: 31.2 g/dL (ref 30.0–36.0)
MCV: 76.2 fL — AB (ref 78.0–100.0)
Platelets: 445 10*3/uL — ABNORMAL HIGH (ref 150–400)
RBC: 3.53 MIL/uL — AB (ref 3.87–5.11)
RDW: 14.6 % (ref 11.5–15.5)
WBC: 6.4 10*3/uL (ref 4.0–10.5)

## 2015-04-13 LAB — TROPONIN I

## 2015-04-13 MED ORDER — FAMOTIDINE IN NACL 20-0.9 MG/50ML-% IV SOLN
INTRAVENOUS | Status: AC
  Filled 2015-04-13: qty 50

## 2015-04-13 MED ORDER — SODIUM CHLORIDE 0.9 % IV SOLN
Freq: Once | INTRAVENOUS | Status: AC
  Administered 2015-04-13: 11:00:00 via INTRAVENOUS

## 2015-04-13 MED ORDER — IOHEXOL 350 MG/ML SOLN
100.0000 mL | Freq: Once | INTRAVENOUS | Status: AC | PRN
  Administered 2015-04-13: 100 mL via INTRAVENOUS

## 2015-04-13 MED ORDER — AZITHROMYCIN 250 MG PO TABS: 250.0000 mg | ORAL_TABLET | Freq: Every day | ORAL | Status: DC

## 2015-04-13 MED ORDER — INSULIN REGULAR HUMAN 100 UNIT/ML IJ SOLN
8.0000 [IU] | Freq: Once | INTRAMUSCULAR | Status: AC | PRN
  Administered 2015-04-13: 8 [IU] via SUBCUTANEOUS
  Filled 2015-04-13: qty 0.08

## 2015-04-13 MED ORDER — SODIUM CHLORIDE 0.9 % IV SOLN
563.5000 mg | Freq: Once | INTRAVENOUS | Status: DC
  Filled 2015-04-13: qty 56

## 2015-04-13 MED ORDER — DEXAMETHASONE SODIUM PHOSPHATE 100 MG/10ML IJ SOLN
Freq: Once | INTRAMUSCULAR | Status: AC
  Administered 2015-04-13: 11:00:00 via INTRAVENOUS
  Filled 2015-04-13: qty 8

## 2015-04-13 MED ORDER — DIPHENHYDRAMINE HCL 50 MG/ML IJ SOLN
50.0000 mg | Freq: Once | INTRAMUSCULAR | Status: AC
  Administered 2015-04-13: 50 mg via INTRAVENOUS

## 2015-04-13 MED ORDER — FAMOTIDINE IN NACL 20-0.9 MG/50ML-% IV SOLN
20.0000 mg | Freq: Once | INTRAVENOUS | Status: AC
Start: 2015-04-13 — End: 2015-04-13
  Administered 2015-04-13: 20 mg via INTRAVENOUS

## 2015-04-13 MED ORDER — INSULIN REGULAR HUMAN 100 UNIT/ML IJ SOLN
8.0000 [IU] | Freq: Once | INTRAMUSCULAR | Status: AC
  Administered 2015-04-13: 8 [IU] via SUBCUTANEOUS
  Filled 2015-04-13: qty 0.08

## 2015-04-13 MED ORDER — DEXTROSE 5 % IV SOLN
1.0000 g | Freq: Once | INTRAVENOUS | Status: AC
  Administered 2015-04-13: 1 g via INTRAVENOUS
  Filled 2015-04-13: qty 10

## 2015-04-13 MED ORDER — PACLITAXEL CHEMO INJECTION 300 MG/50ML
175.0000 mg/m2 | Freq: Once | INTRAVENOUS | Status: AC
  Administered 2015-04-13: 378 mg via INTRAVENOUS
  Filled 2015-04-13: qty 63

## 2015-04-13 MED ORDER — DEXTROSE 5 % IV SOLN
500.0000 mg | Freq: Once | INTRAVENOUS | Status: AC
  Administered 2015-04-14: 500 mg via INTRAVENOUS
  Filled 2015-04-13: qty 500

## 2015-04-13 MED ORDER — DIPHENHYDRAMINE HCL 50 MG/ML IJ SOLN
INTRAMUSCULAR | Status: AC
  Filled 2015-04-13: qty 1

## 2015-04-13 NOTE — Assessment & Plan Note (Signed)
Pt has a hx of diabetes; and was noted to have blood sugar elevated to 345 today.  Most likely secondary to dexamethasone prescribed surrounding her chemotherapy.  Advised patient to continue to monitor her blood sugars at home.  Also, patient may very well need to have her diabetic medications adjusted per her primary care physician while she is in the midst of chemotherapy.

## 2015-04-13 NOTE — Discharge Instructions (Signed)

## 2015-04-13 NOTE — ED Notes (Signed)
Patient transported to X-ray 

## 2015-04-13 NOTE — ED Provider Notes (Signed)
CSN: [id number]     Arrival date & time 04/13/15  1750 History   First MD Initiated Contact with Patient 04/13/15 2003     Chief Complaint  Patient presents with  . Chest Pain     (Consider location/radiation/quality/duration/timing/severity/associated sxs/prior Treatment) HPI Comments: History a history of ovarian cancer presents with chest pain. She states that she was receiving her first dose of chemotherapy agents at the [facility] today. She received the first one without problem. When she was starting the infusion of the second chemotherapy agent, she developed some tightness across her chest and some intermittent sharp pains to the center of her chest. She has had a right-sided pleural effusion for which she's had 2 thoracentesis. The most recent thoracentesis was yesterday. She states she's had very similar tightness with the fluid buildup. She has not had a sharp pains before she was a little bit short of breath today when she was walking around in the cancer center. She hasn't noticed any shortness of breath prior to that. She notes a little bit of swelling to her legs and feels a little more bloated in her abdomen. She denies any fevers or chills. The pain in her chest does not seem to be worse with inspiration.  Patient is a 64 y.o. female presenting with chest pain.  Chest Pain Associated symptoms: shortness of breath   Associated symptoms: no abdominal pain, no back pain, no cough, no diaphoresis, no dizziness, no fatigue, no fever, no headache, no nausea, no numbness, not vomiting and no weakness     Past Medical History  Diagnosis Date  . Chronic diastolic heart failure   . Congestive heart failure, unspecified   . Other dyspnea and respiratory abnormality   . Essential hypertension, benign   . Other and unspecified hyperlipidemia   . Type II or unspecified type diabetes mellitus with peripheral circulatory disorders, not stated as uncontrolled(250.70)   . Anxiety state,  unspecified   . Depressive disorder, not elsewhere classified   . Unspecified asthma, with exacerbation   . Recent retinal detachment, partial, with giant tear   . Anemia, unspecified   . Wheezing   . Acute upper respiratory infections of unspecified site   . Contact dermatitis and other eczema, due to unspecified cause   . Other diseases of lung, not elsewhere classified   . Unspecified urinary incontinence   . Cellulitis   . Mastoiditis   . Diabetes mellitus    Past Surgical History  Procedure Laterality Date  . Knee surgery  2003    meniscus  . Eye surgery      Left retina repair- 11/09, 02-20-09  . Laparoscopic gastric banding  08/21/10  . Abdominal hysterectomy    . Dilation and curettage of uterus     Family History  Problem Relation Age of Onset  . Cancer Mother     breast  . Cancer Sister     breast  . Cancer Maternal Aunt     breast  . Cancer Maternal Uncle     breast  . Cancer Maternal Grandmother     breast  . Cancer Maternal Uncle    History  Substance Use Topics  . Smoking status: Never Smoker   . Smokeless tobacco: Never Used  . Alcohol Use: No   OB History    No data available     Review of Systems  Constitutional: Negative for fever, chills, diaphoresis and fatigue.  HENT: Negative for congestion, rhinorrhea and sneezing.  Eyes: Negative.   Respiratory: Positive for shortness of breath. Negative for cough and chest tightness.   Cardiovascular: Positive for chest pain. Negative for leg swelling.  Gastrointestinal: Negative for nausea, vomiting, abdominal pain, diarrhea and blood in stool.  Genitourinary: Negative for frequency, hematuria, flank pain and difficulty urinating.  Musculoskeletal: Negative for back pain and arthralgias.  Skin: Negative for rash.  Neurological: Negative for dizziness, speech difficulty, weakness, numbness and headaches.      Allergies  Aspirin and Codeine  Home Medications   Prior to Admission medications    Medication Sig Start Date End Date Taking? Authorizing Provider  busPIRone (BUSPAR) 10 MG tablet Take 1 tablet (10 mg total) by mouth at bedtime. 01/09/15  Yes Lucille Passy, MD  cycloSPORINE (RESTASIS) 0.05 % ophthalmic emulsion Place 1 drop into both eyes 2 (two) times daily.   Yes Historical Provider, MD  dexamethasone (DECADRON) 4 MG tablet Take 5 tablets with food 12 hrs and 6 hrs prior to Taxol Chemotherapy 04/07/15  Yes Lennis Marion Downer, MD  ferrous fumarate (HEMOCYTE - 106 MG FE) 325 (106 FE) MG TABS tablet Take 1 tablet on an empty stomach with OJ or Vitamin C tablet 04/07/15  Yes Lennis P Livesay, MD  FLUoxetine (PROZAC) 20 MG capsule Take 3 capsules (60 mg total) by mouth daily. 01/09/15  Yes Lucille Passy, MD  glucose blood (ONE TOUCH ULTRA TEST) test strip Check blood sugar four times a day. Dx 250.70 Patient taking differently: 1 each by Other route 2 (two) times daily. Check blood sugar in the am . Dx 250.70 08/27/12  Yes Lucille Passy, MD  glyBURIDE (DIABETA) 5 MG tablet Take 1 tablet (5 mg total) by mouth 2 (two) times daily with a meal. 01/09/15  Yes Lucille Passy, MD  ibuprofen (ADVIL,MOTRIN) 200 MG tablet Take 200 mg by mouth every 6 (six) hours as needed for headache.   Yes Historical Provider, MD  insulin glargine (LANTUS) 100 UNIT/ML injection Inject 15 Units into the skin at bedtime.   Yes Historical Provider, MD  lisinopril (PRINIVIL,ZESTRIL) 10 MG tablet Take 1 tablet (10 mg total) by mouth daily. 01/09/15  Yes Lucille Passy, MD  LORazepam (ATIVAN) 0.5 MG tablet Place 1 tablet under the tongue to dissolve or swallow every 6 hrs as needed for nausea. Will make you Drowsy 04/07/15  Yes Lennis Marion Downer, MD  lovastatin (MEVACOR) 20 MG tablet Take 1 tablet (20 mg total) by mouth at bedtime. 01/09/15  Yes Lucille Passy, MD  metFORMIN (GLUCOPHAGE) 500 MG tablet Take 1 tablet (500 mg total) by mouth 2 (two) times daily with a meal. 01/09/15  Yes Lucille Passy, MD  ondansetron (ZOFRAN) 8 MG  tablet Take 1 tablet (8 mg total) by mouth every 8 (eight) hours as needed for nausea or vomiting. Will not make drowsy. 04/07/15  Yes Lennis Marion Downer, MD  ranitidine (ZANTAC) 150 MG tablet Take 1 tablet (150 mg total) by mouth 2 (two) times daily. 11/14/14  Yes Lucille Passy, MD  azithromycin (ZITHROMAX) 250 MG tablet Take 1 tablet (250 mg total) by mouth daily. Take  1 every day until finished. 04/13/15   Malvin Johns, MD  zoster vaccine live, PF, (ZOSTAVAX) 93235 UNT/0.65ML injection Inject 19,400 Units into the skin once. Patient not taking: Reported on 04/07/2015 01/09/15   Lucille Passy, MD   BP 128/52 mmHg  Pulse 92  Temp(Src) 97.9 F (36.6 C) (Oral)  Resp 21  SpO2  98% Physical Exam  Constitutional: She is oriented to person, place, and time. She appears well-developed and well-nourished.  HENT:  Head: Normocephalic and atraumatic.  Eyes: Pupils are equal, round, and reactive to light.  Neck: Normal range of motion. Neck supple.  Cardiovascular: Normal rate, regular rhythm and normal heart sounds.   Pulmonary/Chest: Effort normal and breath sounds normal. No respiratory distress. She has no wheezes. She has no rales. She exhibits no tenderness.  Abdominal: Soft. Bowel sounds are normal. There is no tenderness. There is no rebound and no guarding.  Musculoskeletal: Normal range of motion. She exhibits no edema.  No significant edema or calf tenderness  Lymphadenopathy:    She has no cervical adenopathy.  Neurological: She is alert and oriented to person, place, and time.  Skin: Skin is warm and dry. No rash noted.  Psychiatric: She has a normal mood and affect.    ED Course  Procedures (including critical care time) Labs Review Labs Reviewed  BASIC METABOLIC PANEL - Abnormal; Notable for the following:    Sodium 130 (*)    Chloride 99 (*)    CO2 21 (*)    Glucose, Bld 330 (*)    BUN 22 (*)    Calcium 8.7 (*)    All other components within normal limits  CBC - Abnormal;  Notable for the following:    RBC 3.53 (*)    Hemoglobin 8.4 (*)    HCT 26.9 (*)    MCV 76.2 (*)    MCH 23.8 (*)    Platelets 445 (*)    All other components within normal limits  TROPONIN I    Imaging Review Dg Chest 1 View  04/12/2015   CLINICAL DATA:  Status post right thoracentesis  EXAM: CHEST  1 VIEW  COMPARISON:  04/07/2015  FINDINGS: Cardiac shadow is stable. The left lung remains clear. The right lung is well aerated without evidence of pneumothorax following thoracentesis. There is been reduction in the degree of pleural fluid when compared with the prior exam.  IMPRESSION: Improved right pleural effusion following thoracentesis. No definitive pneumothorax is seen.   Electronically Signed   By: Inez Catalina M.D.   On: 04/12/2015 12:49   Dg Chest 2 View  04/13/2015   CLINICAL DATA:  Patient is seen in chemotherapy for the first time today having chest tightness and shortness of breath.  EXAM: CHEST  2 VIEW  COMPARISON:  April 12, 2015  FINDINGS: The heart size and mediastinal contours are within normal limits. There is mild atelectasis of right lung base. There is a small right pleural effusion. The left lung is clear. The visualized skeletal structures are stable.  IMPRESSION: Mild atelectasis of right lung base improved compared to prior exam. Minimal right pleural effusion. There is no pneumothorax.   Electronically Signed   By: Abelardo Diesel M.D.   On: 04/13/2015 18:48   Ct Angio Chest Pe W/cm &/or Wo Cm  04/13/2015   CLINICAL DATA:  Acute chest pain today.  EXAM: CT ANGIOGRAPHY CHEST WITH CONTRAST  TECHNIQUE: Multidetector CT imaging of the chest was performed using the standard protocol during bolus administration of intravenous contrast. Multiplanar CT image reconstructions and MIPs were obtained to evaluate the vascular anatomy.  CONTRAST:  120mL OMNIPAQUE IOHEXOL 350 MG/ML SOLN  COMPARISON:  03/29/2015  FINDINGS: Cardiovascular: There is good opacification of the pulmonary  arteries. There is no pulmonary embolism. The thoracic aorta is normal in caliber and intact.  Lungs: There is  new consolidation in the right middle lobe and this could be infectious. There is mildly worsened ground-glass opacity adjacent to the moderate right pleural effusion. This could represent compressive atelectasis but infectious infiltrate in the right lower lobe can't be excluded. The left lung is clear.  Central airways: Patent  Effusions: Moderate right pleural effusion, unchanged from 03/29/2015  Lymphadenopathy: None  Esophagus: Mild mural thickening of the esophagus, nonfocal. Unchanged.  Upper abdomen: Lap band. Small volume perihepatic ascites, reduced from 03/29/2015.  Musculoskeletal: No significant abnormality  Review of the MIP images confirms the above findings.  IMPRESSION: 1. Negative for pulmonary embolism 2. New right middle lobe consolidation. This could represent pneumonia. 3. Unchanged moderate right pleural effusion with adjacent ground-glass opacity in the right lower lobe, possibly due to compressive atelectasis. The right lower lobe opacities have worsened slightly. 4. Lap band.  Mild diffuse mural thickening of the esophagus.   Electronically Signed   By: Andreas Newport M.D.   On: 04/13/2015 22:28   US Thoracentesis Asp Pleural Space W/img Guide  04/12/2015   INDICATION: Symptomatic right sided pleural effusion  EXAM: US THORACENTESIS ASP PLEURAL SPACE W/IMG GUIDE  COMPARISON:  Thoracentesis 04/07/15.  MEDICATIONS: None  COMPLICATIONS: None immediate  TECHNIQUE: Informed written consent was obtained from the patient after a discussion of the risks, benefits and alternatives to treatment. A timeout was performed prior to the initiation of the procedure.  Initial ultrasound scanning demonstrates a right pleural effusion. The lower chest was prepped and draped in the usual sterile fashion. 1% lidocaine was used for local anesthesia.  Under direct ultrasound guidance, a 19 gauge,  7-cm, Yueh catheter was introduced. An ultrasound image was saved for documentation purposes. The thoracentesis was performed. The catheter was removed and a dressing was applied. The patient tolerated the procedure well without immediate post procedural complication. The patient was escorted to have an upright chest radiograph.  FINDINGS: A total of approximately 1.6 liters of bloody fluid was removed. Requested samples were sent to the laboratory.  IMPRESSION: Successful ultrasound-guided right sided thoracentesis yielding 1.6 liters of pleural fluid.  Read By:  Tsosie Billing PA-C   Electronically Signed   By: Aletta Edouard M.D.   On: 04/12/2015 13:40     EKG Interpretation   Date/Time:  Thursday April 13 2015 18:02:55 EDT Ventricular Rate:  97 PR Interval:  209 QRS Duration: 81 QT Interval:  367 QTC Calculation: 466 R Axis:   36 Text Interpretation:  Sinus rhythm Borderline prolonged PR interval Low  voltage, precordial leads since last tracing no significant change  Confirmed by Mikela Senn  MD, Armanda Forand ([id number]) on 04/13/2015 8:13:45 PM      MDM   Final diagnoses:  CAP (community acquired pneumonia)    Patient with a history of recently diagnosed ovarian cancer presents with some chest tightness and mild shortness of breath. Her EKG does not show ischemic changes. Her symptoms sound more pulmonary in nature. I did do a CT scan of her chest which did not show any evidence of pulmonary embolus. However there was evidence of right sided pneumonia. Patient received her first dose of chemotherapy today. She is not neutropenic. She has no hypoxia. I walked her around the ED and she had no significant shortness of breath or hypoxia. Her troponin is negative. Her hemoglobin is slightly lower than her baseline values. She was given a dose of Rocephin and Zithromax in the ED for community-acquired pneumonia. I feel that she can go home for an  outpatient trial of treatment. I advised her to contact her  oncologist in the morning for a follow-up visit. I advised her to return here for any worsening symptoms.    Malvin Johns, MD 04/13/15 [phone]

## 2015-04-13 NOTE — Assessment & Plan Note (Signed)
Patient has brisk been diagnosed with a right pleural effusion.  She underwent a right thoracentesis on 04/07/2015 removing 1.3 L of pleural fluid.  She once again underwent a right thoracentesis just yesterday 04/12/2015.  Removing approximately 1.6 L of bloody pleural fluid.  Patient states she immediately felt much better following her last thoracentesis.  Patient is now complaining of acute onset chest discomfort; but states that this chest discomfort is how she typically feels when she has increased pleural effusion.  Patient will be transported to the emergency department for further evaluation and management this evening.

## 2015-04-13 NOTE — Progress Notes (Signed)
1600 patient finished taxol treatment, flushing line with normal saline. Patient c/o  Burning in chest, tightness and difficult to breathe. Retta Mac NP notified. Jenny Reichmann, NP in to see pt/ discussed case with on-call MD. Will monitor pt.  1649: Pt reports chest "tightness" persists. Skin warm and dry. Vital signs stable. Retta Mac, NP notified. 1710: Pt ambulated to restroom, reported feeling short of breath. SpO2 stable. 1725: Order received from Retta Mac, NP to received to transport pt to ED for chest pain. Pt taken via wheelchair by this RN.

## 2015-04-13 NOTE — Assessment & Plan Note (Addendum)
Patient presented to the [facility] today to receive her first cycle of new adjuvant Taxol/carboplatin chemotherapy regimen.  She received the complete Taxol infusion this afternoon with no difficulty whatsoever.  All awaiting initiation of the carboplatin portion of her chemotherapy-patient developed some acute onset chest discomfort.  Carboplatin chemotherapy was held today; and patient was transported to the emergency department for further evaluation and management.  Please see previous note for further details.  The plan is for the patient to return on 04/17/2015 for labs, follow up visit, and possible Granix injection- depending on patient's blood counts.  Dr. Marko Plume has given parameters for any growth factor support.  Injections.

## 2015-04-13 NOTE — Patient Instructions (Signed)
[facility] Discharge Instructions for Patients Receiving Chemotherapy  Today you received the following chemotherapy agents: Taxol.  To help prevent nausea and vomiting after your treatment, we encourage you to take your nausea medication: Zofran. Take one every 8 hours as needed. Take Lorazepam for nausea not relieved by Zofran. Ativan will make you drowsy.    If you develop nausea and vomiting that is not controlled by your nausea medication, call the clinic.   BELOW ARE SYMPTOMS THAT SHOULD BE REPORTED IMMEDIATELY:  *FEVER GREATER THAN 100.5 F  *CHILLS WITH OR WITHOUT FEVER  NAUSEA AND VOMITING THAT IS NOT CONTROLLED WITH YOUR NAUSEA MEDICATION  *UNUSUAL SHORTNESS OF BREATH  *UNUSUAL BRUISING OR BLEEDING  TENDERNESS IN MOUTH AND THROAT WITH OR WITHOUT PRESENCE OF ULCERS  *URINARY PROBLEMS  *BOWEL PROBLEMS  UNUSUAL RASH Items with * indicate a potential emergency and should be followed up as soon as possible.  Feel free to call the clinic should you have any questions or concerns. The clinic phone number is [phone].  Please show the [facility] at check-in to the Emergency Department and triage nurse.

## 2015-04-13 NOTE — Assessment & Plan Note (Signed)
Patient completed her first cycle of Taxol chemotherapy infusion with no difficulties whatsoever.  Prior to having gained/initiating the initial carboplatin chemotherapy infusion-patient developed acute onset chest pressure; with some heaviness in her chest.  She described the discomfort as a burning in her chest.  She also complains of some pain with inspiration as well.  She denies any shortness of breath while at rest; but does complain of some increased dyspnea with any activity whatsoever.  He shouldn't reports that this discomfort is typically associated with increasing pleural effusions.  However, patient just obtained a right thoracentesis yesterday 04/12/2015.  Removing approximately 1.6 L of bloody fluid.  On exam.-Bilateral breath sounds clear; with no rhonchi or wheezes.  There is no cough present on exam.  Patient does not appear in acute distress whatsoever.  All vitals remained stable; and O2 sat in the upper 90 range.  Of note-potassium is elevated to 5.2 today.  Confirmed the patient does not take any potassium supplements.  After careful review of all patient's symptoms with both on-call physician, Dr. Benay Spice; as well as primary oncologist, Dr. Inda Castle was made for patient to be transported to the emergency department for further evaluation and management.  Brief history.  Report were called to the emergency department charge nurse prior to transporting the patient to the emergency department, the a wheelchair.  Per the cancer Center nurse.

## 2015-04-13 NOTE — Progress Notes (Signed)
SYMPTOM MANAGEMENT CLINIC   HPI: [name] 64 y.o. female diagnosed with ovarian cancer; with.  Her to kneel carcinomatosis.  Presented to the cancer Center today to initiate neoadjuvant chemotherapy with both Taxol and carboplatin.  Patient completed her first cycle of Taxol chemotherapy infusion with no difficulties whatsoever.  Prior to having gained/initiating the initial carboplatin chemotherapy infusion-patient developed acute onset chest pressure; with some heaviness in her chest.  She described the discomfort as a burning in her chest.  She also complains of some pain with inspiration as well.  She denies any shortness of breath while at rest; but does complain of some increased dyspnea with any activity whatsoever.  He shouldn't reports that this discomfort is typically associated with increasing pleural effusions.  However, patient just obtained a right thoracentesis yesterday 04/12/2015.  Removing approximately 1.6 L of bloody fluid.  On exam.-Bilateral breath sounds clear; with no rhonchi or wheezes.  There is no cough present on exam.  Patient does not appear in acute distress whatsoever.  All vitals remained stable; and O2 sat in the upper 90 range.  Of note-potassium is elevated to 5.2 today.  Confirmed the patient does not take any potassium supplements.  After careful review of all patient's symptoms with both on-call physician, Dr. Truett Perna; as well as primary oncologist, Dr. Luan Pulling was made for patient to be transported to the emergency department for further evaluation and management.  Brief history.  Report were called to the emergency department charge nurse prior to transporting the patient to the emergency department, the a wheelchair.  Per the cancer Center nurse.    HPI  ROS  Past Medical History  Diagnosis Date  . Chronic diastolic heart failure   . Congestive heart failure, unspecified   . Other dyspnea and respiratory abnormality   .  Essential hypertension, benign   . Other and unspecified hyperlipidemia   . Type II or unspecified type diabetes mellitus with peripheral circulatory disorders, not stated as uncontrolled(250.70)   . Anxiety state, unspecified   . Depressive disorder, not elsewhere classified   . Unspecified asthma, with exacerbation   . Recent retinal detachment, partial, with giant tear   . Anemia, unspecified   . Wheezing   . Acute upper respiratory infections of unspecified site   . Contact dermatitis and other eczema, due to unspecified cause   . Other diseases of lung, not elsewhere classified   . Unspecified urinary incontinence   . Cellulitis   . Mastoiditis   . Diabetes mellitus     Past Surgical History  Procedure Laterality Date  . Knee surgery  2003    meniscus  . Eye surgery      Left retina repair- 11/09, 02-20-09  . Laparoscopic gastric banding  08/21/10  . Abdominal hysterectomy    . Dilation and curettage of uterus      has Diabetes with retinopathy; HLD (hyperlipidemia); OBESITY, UNSPECIFIED; ANEMIA; ANXIETY; DEPRESSION; RECENT RETINAL DETACHMENT PARTIAL W/GIANT TEAR; RETINAL DETACHMENT; HYPERTENSION, BENIGN; CHF; DIASTOLIC HEART FAILURE, CHRONIC; ASTHMA, WITH ACUTE EXACERBATION; LUNG NODULE; Contact dermatitis and other eczema, due to unspecified cause; WHEEZING; DYSPNEA ON EXERTION; POSTMENOPAUSAL STATUS; Right knee pain; OA (osteoarthritis) of finger; Vaginal irritation; Dizziness and giddiness; Hematuria; Acute pain in female pelvis; Preventative health care; Other chest pain; Cough; Pelvic mass in female; Peritoneal carcinomatosis; Ovarian cancer; Iron deficiency anemia; Pleural effusion; Chest pain; Hyperglycemia; and Hyperkalemia on her problem list.    is allergic to aspirin and codeine.    Medication  List       This list is accurate as of: 04/13/15  5:56 PM.  Always use your most recent med list.               busPIRone 10 MG tablet  Commonly known as:  BUSPAR  Take  1 tablet (10 mg total) by mouth at bedtime.     dexamethasone 4 MG tablet  Commonly known as:  DECADRON  Take 5 tablets with food 12 hrs and 6 hrs prior to Taxol Chemotherapy     ferrous fumarate 325 (106 FE) MG Tabs tablet  Commonly known as:  HEMOCYTE - 106 mg FE  Take 1 tablet on an empty stomach with OJ or Vitamin C tablet     FLUoxetine 20 MG capsule  Commonly known as:  PROZAC  Take 3 capsules (60 mg total) by mouth daily.     glucose blood test strip  Commonly known as:  ONE TOUCH ULTRA TEST  Check blood sugar four times a day. Dx 250.70     glyBURIDE 5 MG tablet  Commonly known as:  DIABETA  Take 1 tablet (5 mg total) by mouth 2 (two) times daily with a meal.     LANTUS 100 UNIT/ML injection  Generic drug:  insulin glargine  Inject 15 Units into the skin at bedtime.     lisinopril 10 MG tablet  Commonly known as:  PRINIVIL,ZESTRIL  Take 1 tablet (10 mg total) by mouth daily.     LORazepam 0.5 MG tablet  Commonly known as:  ATIVAN  Place 1 tablet under the tongue to dissolve or swallow every 6 hrs as needed for nausea. Will make you Drowsy     lovastatin 20 MG tablet  Commonly known as:  MEVACOR  Take 1 tablet (20 mg total) by mouth at bedtime.     metFORMIN 500 MG tablet  Commonly known as:  GLUCOPHAGE  Take 1 tablet (500 mg total) by mouth 2 (two) times daily with a meal.     ondansetron 8 MG tablet  Commonly known as:  ZOFRAN  Take 1 tablet (8 mg total) by mouth every 8 (eight) hours as needed for nausea or vomiting. Will not make drowsy.     ranitidine 150 MG tablet  Commonly known as:  ZANTAC  Take 1 tablet (150 mg total) by mouth 2 (two) times daily.     RESTASIS 0.05 % ophthalmic emulsion  Generic drug:  cycloSPORINE  Place 1 drop into both eyes 2 (two) times daily.     zoster vaccine live (PF) 19400 UNT/0.65ML injection  Commonly known as:  ZOSTAVAX  Inject 19,400 Units into the skin once.         PHYSICAL EXAMINATION  Oncology Vitals  04/13/2015 04/13/2015 04/13/2015 04/13/2015 04/13/2015 04/13/2015 04/13/2015  Height - - - - - - -  Weight - - - - - - -  Weight (lbs) - - - - - - -  BMI (kg/m2) - - - - - - -  Temp 97.9 97.1 97.3 98.6 97.5 97.7 97.9  Pulse 97 98 99 98 89 88 92  Resp $Rem'22 20 16 16 18 18 18  'mtXi$ SpO2 97 - 97 - 97 98 98  BSA (m2) - - - - - - -   BP Readings from Last 3 Encounters:  04/13/15 131/57  04/13/15 129/45  04/12/15 118/42    Physical Exam  Constitutional: She is oriented to person, place, and time and well-developed, well-nourished, and  in no distress.  HENT:  Head: Normocephalic and atraumatic.  Mouth/Throat: Oropharynx is clear and moist.  Eyes: Conjunctivae and EOM are normal. Pupils are equal, round, and reactive to light. Right eye exhibits no discharge. Left eye exhibits no discharge. No scleral icterus.  Neck: Normal range of motion. Neck supple. No JVD present. No tracheal deviation present. No thyromegaly present.  Cardiovascular: Normal rate, regular rhythm, normal heart sounds and intact distal pulses.   Pulmonary/Chest: Breath sounds normal. No stridor. No respiratory distress. She has no wheezes. She has no rales. She exhibits no tenderness.  Slight dyspnea with any exertion.  No acute respiratory distress.  Abdominal: Soft. Bowel sounds are normal. She exhibits no distension and no mass. There is no tenderness. There is no rebound and no guarding.  Musculoskeletal: Normal range of motion. She exhibits no edema or tenderness.  Lymphadenopathy:    She has no cervical adenopathy.  Neurological: She is alert and oriented to person, place, and time.  Skin: Skin is warm and dry. No rash noted. No erythema. No pallor.  Psychiatric: Affect normal.  Nursing note and vitals reviewed.   LABORATORY DATA:. Appointment on 04/13/2015  Component Date Value Ref Range Status  . WBC 04/13/2015 5.8  3.9 - 10.3 10e3/uL Final  . NEUT# 04/13/2015 5.4  1.5 - 6.5 10e3/uL Final  . HGB 04/13/2015 9.5* 11.6 -  15.9 g/dL Final  . HCT 04/13/2015 29.7* 34.8 - 46.6 % Final  . Platelets 04/13/2015 383  145 - 400 10e3/uL Final  . MCV 04/13/2015 75.2* 79.5 - 101.0 fL Final  . MCH 04/13/2015 24.1* 25.1 - 34.0 pg Final  . MCHC 04/13/2015 32.0  31.5 - 36.0 g/dL Final  . RBC 04/13/2015 3.95  3.70 - 5.45 10e6/uL Final  . RDW 04/13/2015 14.4  11.2 - 14.5 % Final  . lymph# 04/13/2015 0.3* 0.9 - 3.3 10e3/uL Final  . MONO# 04/13/2015 0.0* 0.1 - 0.9 10e3/uL Final  . Eosinophils Absolute 04/13/2015 0.0  0.0 - 0.5 10e3/uL Final  . Basophils Absolute 04/13/2015 0.0  0.0 - 0.1 10e3/uL Final  . NEUT% 04/13/2015 93.9* 38.4 - 76.8 % Final  . LYMPH% 04/13/2015 5.4* 14.0 - 49.7 % Final  . MONO% 04/13/2015 0.5  0.0 - 14.0 % Final  . EOS% 04/13/2015 0.0  0.0 - 7.0 % Final  . BASO% 04/13/2015 0.2  0.0 - 2.0 % Final  . Sodium 04/13/2015 132* 136 - 145 mEq/L Final  . Potassium 04/13/2015 5.2* 3.5 - 5.1 mEq/L Final  . Chloride 04/13/2015 101  98 - 109 mEq/L Final  . CO2 04/13/2015 22  22 - 29 mEq/L Final  . Glucose 04/13/2015 331* 70 - 140 mg/dl Final  . BUN 04/13/2015 18.0  7.0 - 26.0 mg/dL Final  . Creatinine 04/13/2015 0.8  0.6 - 1.1 mg/dL Final  . Total Bilirubin 04/13/2015 0.29  0.20 - 1.20 mg/dL Final  . Alkaline Phosphatase 04/13/2015 90  40 - 150 U/L Final  . AST 04/13/2015 13  5 - 34 U/L Final  . ALT 04/13/2015 9  0 - 55 U/L Final  . Total Protein 04/13/2015 7.7  6.4 - 8.3 g/dL Final  . Albumin 04/13/2015 3.3* 3.5 - 5.0 g/dL Final  . Calcium 04/13/2015 9.2  8.4 - 10.4 mg/dL Final  . Anion Gap 04/13/2015 9  3 - 11 mEq/L Final  . EGFR 04/13/2015 72* >90 ml/min/1.73 m2 Final   eGFR is calculated using the CKD-EPI Creatinine Equation (2009)  . Glucose 04/13/2015  307* 70 - 100 mg/dL Final  . HRS PC 04/13/2015 1.5   Final  . Glucose 04/13/2015 345* 70 - 100 mg/dL Final  . HRS PC 04/13/2015 0.5   Final     RADIOGRAPHIC STUDIES: Dg Chest 1 View  04/12/2015   CLINICAL DATA:  Status post right thoracentesis   EXAM: CHEST  1 VIEW  COMPARISON:  04/07/2015  FINDINGS: Cardiac shadow is stable. The left lung remains clear. The right lung is well aerated without evidence of pneumothorax following thoracentesis. There is been reduction in the degree of pleural fluid when compared with the prior exam.  IMPRESSION: Improved right pleural effusion following thoracentesis. No definitive pneumothorax is seen.   Electronically Signed   By: Inez Catalina M.D.   On: 04/12/2015 12:49   US Thoracentesis Asp Pleural Space W/img Guide  04/12/2015   INDICATION: Symptomatic right sided pleural effusion  EXAM: US THORACENTESIS ASP PLEURAL SPACE W/IMG GUIDE  COMPARISON:  Thoracentesis 04/07/15.  MEDICATIONS: None  COMPLICATIONS: None immediate  TECHNIQUE: Informed written consent was obtained from the patient after a discussion of the risks, benefits and alternatives to treatment. A timeout was performed prior to the initiation of the procedure.  Initial ultrasound scanning demonstrates a right pleural effusion. The lower chest was prepped and draped in the usual sterile fashion. 1% lidocaine was used for local anesthesia.  Under direct ultrasound guidance, a 19 gauge, 7-cm, Yueh catheter was introduced. An ultrasound image was saved for documentation purposes. The thoracentesis was performed. The catheter was removed and a dressing was applied. The patient tolerated the procedure well without immediate post procedural complication. The patient was escorted to have an upright chest radiograph.  FINDINGS: A total of approximately 1.6 liters of bloody fluid was removed. Requested samples were sent to the laboratory.  IMPRESSION: Successful ultrasound-guided right sided thoracentesis yielding 1.6 liters of pleural fluid.  Read By:  Tsosie Billing PA-C   Electronically Signed   By: Aletta Edouard M.D.   On: 04/12/2015 13:40    ASSESSMENT/PLAN:    Chest pain Patient completed her first cycle of Taxol chemotherapy infusion with no  difficulties whatsoever.  Prior to having gained/initiating the initial carboplatin chemotherapy infusion-patient developed acute onset chest pressure; with some heaviness in her chest.  She described the discomfort as a burning in her chest.  She also complains of some pain with inspiration as well.  She denies any shortness of breath while at rest; but does complain of some increased dyspnea with any activity whatsoever.  He shouldn't reports that this discomfort is typically associated with increasing pleural effusions.  However, patient just obtained a right thoracentesis yesterday 04/12/2015.  Removing approximately 1.6 L of bloody fluid.  On exam.-Bilateral breath sounds clear; with no rhonchi or wheezes.  There is no cough present on exam.  Patient does not appear in acute distress whatsoever.  All vitals remained stable; and O2 sat in the upper 90 range.  Of note-potassium is elevated to 5.2 today.  Confirmed the patient does not take any potassium supplements.  After careful review of all patient's symptoms with both on-call physician, Dr. Benay Spice; as well as primary oncologist, Dr. Inda Castle was made for patient to be transported to the emergency department for further evaluation and management.  Brief history.  Report were called to the emergency department charge nurse prior to transporting the patient to the emergency department, the a wheelchair.  Per the cancer Center nurse.  Hyperglycemia Pt has a hx of diabetes; and  was noted to have blood sugar elevated to 345 today.  Most likely secondary to dexamethasone prescribed surrounding her chemotherapy.  Advised patient to continue to monitor her blood sugars at home.  Also, patient may very well need to have her diabetic medications adjusted per her primary care physician while she is in the midst of chemotherapy.  Hyperkalemia Potassium was elevated to 5.2 today.  Confirmed the patient is not taking any potassium supplements  whatsoever.  Patient was transported to the emergency department for further evaluation and management this afternoon.  See previous note for further details.  Ovarian cancer Patient presented to the [facility] today to receive her first cycle of new adjuvant Taxol/carboplatin chemotherapy regimen.  She received the complete Taxol infusion this afternoon with no difficulty whatsoever.  All awaiting initiation of the carboplatin portion of her chemotherapy-patient developed some acute onset chest discomfort.  Carboplatin chemotherapy was held today; and patient was transported to the emergency department for further evaluation and management.  Please see previous note for further details.  The plan is for the patient to return on 04/17/2015 for labs, follow up visit, and possible Granix injection- depending on patient's blood counts.  Dr. Marko Plume has given parameters for any growth factor support.  Injections.    Pleural effusion Patient has brisk been diagnosed with a right pleural effusion.  She underwent a right thoracentesis on 04/07/2015 removing 1.3 L of pleural fluid.  She once again underwent a right thoracentesis just yesterday 04/12/2015.  Removing approximately 1.6 L of bloody pleural fluid.  Patient states she immediately felt much better following her last thoracentesis.  Patient is now complaining of acute onset chest discomfort; but states that this chest discomfort is how she typically feels when she has increased pleural effusion.  Patient will be transported to the emergency department for further evaluation and management this evening.  Patient stated understanding of all instructions; and was in agreement with this plan of care. The patient knows to call the clinic with any problems, questions or concerns.   Review/collaboration with Dr. Marko Plume and Dr. Benay Spice regarding all aspects of patient's visit today.   Total time spent with patient was 40 minutes;  with greater  than 75 percent of that time spent in face to face counseling regarding patient's symptoms,  and coordination of care and follow up.  Disclaimer: This note was dictated with voice recognition software. Similar sounding words can inadvertently be transcribed and may not be corrected upon review.   Drue Second, NP 04/13/2015

## 2015-04-13 NOTE — ED Notes (Signed)
Bed: HW86 Expected date:  Expected time:  Means of arrival:  Comments: Cancer center CP

## 2015-04-13 NOTE — Assessment & Plan Note (Signed)
Potassium was elevated to 5.2 today.  Confirmed the patient is not taking any potassium supplements whatsoever.  Patient was transported to the emergency department for further evaluation and management this afternoon.  See previous note for further details.

## 2015-04-13 NOTE — ED Notes (Signed)
Per staff member from cancer center, pt was receiving a first time treatment of chemo today and started having some chest tightness, shortness of breath on and off. Treatment was completed PTA. 8 units of insulin given at cancer center.

## 2015-04-14 ENCOUNTER — Other Ambulatory Visit: Payer: Self-pay | Admitting: Oncology

## 2015-04-14 ENCOUNTER — Telehealth: Payer: Self-pay

## 2015-04-14 DIAGNOSIS — C786 Secondary malignant neoplasm of retroperitoneum and peritoneum: Secondary | ICD-10-CM

## 2015-04-14 DIAGNOSIS — J189 Pneumonia, unspecified organism: Secondary | ICD-10-CM

## 2015-04-14 DIAGNOSIS — C801 Malignant (primary) neoplasm, unspecified: Principal | ICD-10-CM

## 2015-04-14 NOTE — Telephone Encounter (Signed)
-----   Message from Gordy Levan, MD sent at 04/14/2015  8:32 AM EDT ----- First taxol 7-28, with SOB and chest discomfort after taxol completed, carbo held. Evaluated in ED, question of pneumonia. Started on antibiotics - Rocephin in ED then zithromax. Just had thoracentesis on 7-27, not much fluid back. No PE.   RN please call to check on her today. Ask about blood sugars since had steroids with taxol.  Remind her that she may get taxol aches  She needs to take the antibiotic as instructed. Can add mucinex 600 mg bid if needed. Tell her it was best with possible pneumonia not to have given [place] yesterday - we will keep going on chemo after the antibiotics/ after I see her as scheduled. Her PCP T.Deborra Medina knows her well if needed.  Please add CXR to visit with K.Curcio on 8-1. WBC/ANC may not drop too much with just taxol, but would do granix 300 on 8-1,2,3 if [place] <=2.5 on 8-1.  Note Hgb <9 in ED, may need to transfuse 1 - 2 units next week. She is iron deficient, did start oral iron but that will take a while.  RN please check on her by phone later next week after Mon apt please  thanks

## 2015-04-14 NOTE — Telephone Encounter (Signed)
Spoke with Mr. Fetty as MMrs. Bowdish felt well enough this am to go visit her sister in [place]. They got home about 0300 from ED.  Ms. Kaminsky did not take evening insulin for 04-13-15 as she was in the ED.  She did not check BS this am.  She will check it this evening prior to insulin.  Told Mr. Kiesel that BS should be checked in am 03-19-15.  If BS > 250, she should call PCP office to see about additional coverage. She needs to check BS BID, especially aroud steroid dosing with chemotherapy Reviewed the importance of taking the Zithromax as prescribed.   Ms. Parrilla with slight cough. Suggested adding the mucinex as noted below per Dr. Marko Plume at least until seen 04-17-15.  CXR ordered for 04-17-15 prior to Myrtha Mantis, NP visit as requested below by Dr. Marko Plume. Husband to bring Ms. Dearcos to [facility] radiology ~1315 on 04-17-15. Reminded  Husband about Taxol aches that could develop over the weekend and the interventions previously discussed. Mr. Hefel verbalized understanding.

## 2015-04-15 ENCOUNTER — Other Ambulatory Visit: Payer: Self-pay | Admitting: Oncology

## 2015-04-17 ENCOUNTER — Other Ambulatory Visit (HOSPITAL_BASED_OUTPATIENT_CLINIC_OR_DEPARTMENT_OTHER): Payer: Medicare Other

## 2015-04-17 ENCOUNTER — Encounter: Payer: Self-pay | Admitting: Oncology

## 2015-04-17 ENCOUNTER — Telehealth: Payer: Self-pay | Admitting: Oncology

## 2015-04-17 ENCOUNTER — Ambulatory Visit (HOSPITAL_BASED_OUTPATIENT_CLINIC_OR_DEPARTMENT_OTHER): Payer: Medicare Other | Admitting: Oncology

## 2015-04-17 ENCOUNTER — Ambulatory Visit (HOSPITAL_COMMUNITY)
Admission: RE | Admit: 2015-04-17 | Discharge: 2015-04-17 | Disposition: A | Discharge status: Home / Self Care | Payer: Medicare Other | Source: Ambulatory Visit | Attending: Oncology | Admitting: Oncology

## 2015-04-17 ENCOUNTER — Ambulatory Visit: Payer: Medicare Other

## 2015-04-17 VITALS — BP 130/59 | HR 110 | Temp 98.6°F | Resp 17 | Ht 67.0 in | Wt 209.3 lb

## 2015-04-17 DIAGNOSIS — C786 Secondary malignant neoplasm of retroperitoneum and peritoneum: Secondary | ICD-10-CM

## 2015-04-17 DIAGNOSIS — J90 Pleural effusion, not elsewhere classified: Secondary | ICD-10-CM | POA: Diagnosis not present

## 2015-04-17 DIAGNOSIS — C801 Malignant (primary) neoplasm, unspecified: Secondary | ICD-10-CM

## 2015-04-17 DIAGNOSIS — C569 Malignant neoplasm of unspecified ovary: Secondary | ICD-10-CM

## 2015-04-17 DIAGNOSIS — J189 Pneumonia, unspecified organism: Secondary | ICD-10-CM

## 2015-04-17 DIAGNOSIS — D509 Iron deficiency anemia, unspecified: Secondary | ICD-10-CM | POA: Diagnosis not present

## 2015-04-17 LAB — CBC WITH DIFFERENTIAL/PLATELET
BASO%: 0.6 % (ref 0.0–2.0)
BASOS ABS: 0 10*3/uL (ref 0.0–0.1)
EOS%: 2.2 % (ref 0.0–7.0)
Eosinophils Absolute: 0.2 10*3/uL (ref 0.0–0.5)
HCT: 33.9 % — ABNORMAL LOW (ref 34.8–46.6)
HGB: 10.8 g/dL — ABNORMAL LOW (ref 11.6–15.9)
LYMPH%: 11.3 % — ABNORMAL LOW (ref 14.0–49.7)
MCH: 23.5 pg — ABNORMAL LOW (ref 25.1–34.0)
MCHC: 31.8 g/dL (ref 31.5–36.0)
MCV: 73.8 fL — AB (ref 79.5–101.0)
MONO#: 0.1 10*3/uL (ref 0.1–0.9)
MONO%: 1.1 % (ref 0.0–14.0)
NEUT#: 6.5 10*3/uL (ref 1.5–6.5)
NEUT%: 84.8 % — ABNORMAL HIGH (ref 38.4–76.8)
PLATELETS: 448 10*3/uL — AB (ref 145–400)
RBC: 4.6 10*6/uL (ref 3.70–5.45)
RDW: 15.5 % — AB (ref 11.2–14.5)
WBC: 7.7 10*3/uL (ref 3.9–10.3)
lymph#: 0.9 10*3/uL (ref 0.9–3.3)

## 2015-04-17 LAB — COMPREHENSIVE METABOLIC PANEL (CC13)
ALBUMIN: 3.4 g/dL — AB (ref 3.5–5.0)
ALT: 14 U/L (ref 0–55)
AST: 14 U/L (ref 5–34)
Alkaline Phosphatase: 81 U/L (ref 40–150)
Anion Gap: 8 mEq/L (ref 3–11)
BILIRUBIN TOTAL: 0.37 mg/dL (ref 0.20–1.20)
BUN: 16 mg/dL (ref 7.0–26.0)
CO2: 25 meq/L (ref 22–29)
Calcium: 9.2 mg/dL (ref 8.4–10.4)
Chloride: 101 mEq/L (ref 98–109)
Creatinine: 0.7 mg/dL (ref 0.6–1.1)
EGFR: 89 mL/min/{1.73_m2} — AB (ref >90)
Glucose: 131 mg/dl (ref 70–140)
POTASSIUM: 4.8 meq/L (ref 3.5–5.1)
Sodium: 134 mEq/L — ABNORMAL LOW (ref 136–145)
TOTAL PROTEIN: 7.6 g/dL (ref 6.4–8.3)

## 2015-04-17 MED ORDER — HYDROCODONE-ACETAMINOPHEN 5-325 MG PO TABS: 1.0000 | ORAL_TABLET | Freq: Four times a day (QID) | ORAL | Status: DC | PRN

## 2015-04-17 NOTE — Progress Notes (Signed)
[place] OFFICE PROGRESS NOTE   Name: [name] Date: April 17, 2015  MRN: [id number] DOB: 1951-04-05  Physician: D.ClarkePearson, Lucille Passy, MD, B.Hoxworth, Jenell Milliner), [facility] ophthalmology  INTERVAL HISTORY:[name] is a 64 y.o. female who is seen together with husband and son. Patient received her first cycle of chemo on 04/13/15. She received the Taxol, but developed SOB, and chest discomfort after Taxol completed. Carbo was held.  Patient evaluated in the ER and found to  Have right sided pneumonia. No PE or EKG changes. She was given Rocephin and started on Azithromycin. Last dose was today.   Patient reports breathing is better. No cough. CXR done today showed worsening right pleural effusion. She does not feel like she did when she required a thoracentesis in terms of shortness of breath. Thinks breathing is better overall. Having more pain in her legs and knees. Has trouble sleeping due to this. Feels as though she cannot walk without pain. Denies numbness to LE. She has abdominal fullness without actual pain, more comfortable reclining than sitting up. She has early satiety moreso than her usual with the gastric banding, still the regurgitation but no frank nausea or vomiting; she has chronic GERD which seems different than this regurgitation. Bowels are moving generally daily, occasionally uses stool softener. No LE swelling.   ONCOLOGIC HISTORY:  Patient had been in usual health, including fairly well controlled DM and previous gastric banding for obesity, until fairly recent increased regurgitation, SOB and abdominal fullness. She saw PCP with the respiratory complaints, with CXR 03-27-15 showing small right pleural effusion. CT chest 03-29-15 had small to moderate right pleural effusion with associated atelectasis, abdominal ascites and stranding in omentum. CT AP 03-31-15 showed diffuse carcinomatosis, small ascites, implants at diaphragm, 3.4 cm mass left  anterior mid abdomen and 4 cm midline lower pelvis, adnexal mass 8.8 x 10.7 x 12.3 cm, no hepatic lesions, no bowel obstruction, kidneys not remarkable (reports below). 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 He did not feel that this could be optimally debulked at present and recommended initial treatment with chemotherapy. She had CT core needle biopsy of LUQ peritoneal implant on 04-04-15, with pathology [id number] showing poorly differentiated carcinoma with marked anaplasia and frequent mitotic figures, immunohistochemistry most consistent with gyn primary, specifically ovarian.    REVIEW OF SYSTEMS as above, also: Remainder of full 10 point review of systems negative.    Objective:  height is 5' 7"  (1.702 m) and weight is 209 lb 4.8 oz (94.938 kg). Her oral temperature is 98.6 F (37 C). Her blood pressure is 130/59 and her pulse is 110. Her respiration is 17 and oxygen saturation is 97%.  Alert, pleasant, cooperative lady,obese, looks stated age, very pleasant. In a wheelchair today. Respirations not labored with activity in exam room, ambulatory without assistance. Does not appear to be in  discomfort. Good historian. Husband and son supportive.   HEENT: normal hair pattern. PERRL, not icteric.Fair dentition, no obvious acute issues. Oral mucosa moist and clear.  Neck supple without JVD or thyroid mass. Lymphatics: no cervical, supraclavicular, axillary or inguinal adenopathy apparent  Resp: Dullness to percussion and absent BS 1/2 way up right chest, otherwise clear bilaterally. No use of accessory muscles Cardio: heart RRR, no gallop, clear heart sounts. Peripheral pulses intact and symmetrical GI: obese, soft, not obviously distended. Gastric band apparatus palpable as firm area right  upper mid abdomen. Bowel sounds present. Cannot appreciate HSM or mass.  Musculoskeletal:back not tender.  Extremities without swelling, edema, cords, tenderness Neuro: No significant peripheral neuopathy. Otherwise nonfocal. Psych appropriate mood and affect Skin: some patchy hypopigmentation on arms bilaterally, no rash, ecchymosis, petechiae   LABORATORY DATA:  Results for orders placed or performed in visit on 04/17/15 (from the past 48 hour(s))  CBC with Differential     Status: Abnormal   Collection Time: 04/17/15  2:08 PM  Result Value Ref Range   WBC 7.7 3.9 - 10.3 10e3/uL   NEUT# 6.5 1.5 - 6.5 10e3/uL   HGB 10.8 (L) 11.6 - 15.9 g/dL   HCT 33.9 (L) 34.8 - 46.6 %   Platelets 448 (H) 145 - 400 10e3/uL   MCV 73.8 (L) 79.5 - 101.0 fL   MCH 23.5 (L) 25.1 - 34.0 pg   MCHC 31.8 31.5 - 36.0 g/dL   RBC 4.60 3.70 - 5.45 10e6/uL   RDW 15.5 (H) 11.2 - 14.5 %   lymph# 0.9 0.9 - 3.3 10e3/uL   MONO# 0.1 0.1 - 0.9 10e3/uL   Eosinophils Absolute 0.2 0.0 - 0.5 10e3/uL   Basophils Absolute 0.0 0.0 - 0.1 10e3/uL   NEUT% 84.8 (H) 38.4 - 76.8 %   LYMPH% 11.3 (L) 14.0 - 49.7 %   MONO% 1.1 0.0 - 14.0 %   EOS% 2.2 0.0 - 7.0 %   BASO% 0.6 0.0 - 2.0 %  Comprehensive metabolic panel (Cmet) - [facility]     Status: Abnormal   Collection Time: 04/17/15  2:09 PM  Result Value Ref Range   Sodium 134 (L) 136 - 145 mEq/L   Potassium 4.8 3.5 - 5.1 mEq/L   Chloride 101 98 - 109 mEq/L   CO2 25 22 - 29 mEq/L   Glucose 131 70 - 140 mg/dl   BUN 16.0 7.0 - 26.0 mg/dL   Creatinine 0.7 0.6 - 1.1 mg/dL   Total Bilirubin 0.37 0.20 - 1.20 mg/dL   Alkaline Phosphatase 81 40 - 150 U/L   AST 14 5 - 34 U/L   ALT 14 0 - 55 U/L   Total Protein 7.6 6.4 - 8.3 g/dL   Albumin 3.4 (L) 3.5 - 5.0 g/dL   Calcium 9.2 8.4 - 10.4 mg/dL   Anion Gap 8 3 - 11 mEq/L   EGFR 89 (L) >90 ml/min/1.73 m2    Comment: eGFR is calculated using the CKD-EPI Creatinine Equation (2009)    CBC only was available at time of visit. We will be in touch with patient with results of other labs as above.  PATHOLOGY: EULALAH, RUPERT Collected:  04/04/2015 Client: [place] Accession: [id number] HOLOGY FINAL DIAGNOSIS Diagnosis Peritoneum, biopsy - POORLY DIFFERENTIATED CARCINOMA. Microscopic Comment There is poorly differentiated carcinoma characterized by nests and sheets of tumor cells with marked anaplasia and frequent mitotic figures. Immunohistochemistry shows positivity with cytokeratin 7, cytokeratin 8/18, estrogen receptor, MOC-31, p16 and nuclear positivity with WT-1. The tumor is negative with CDX2, CD10, cytokeratin 20, gross cystic disease fluid protein, Napsin-A- progesterone receptor, S-100 and thyroid transcription factor-1. The immunophenotype and morphology are most consistent with a gynecologic primary, most likely ovarian.  RADIOGRAPHY:  US thoracentesis for 1.3 liters bloody fluid from right, and follow up CXR done after visit today  Dg Chest 2 View  04/17/2015   CLINICAL DATA:  Followup pneumonia.  EXAM: CHEST  2 VIEW  COMPARISON:  Chest x-ray 04/13/2015 and chest CT, same date.  FINDINGS: The cardiac silhouette, mediastinal and hilar contours are within normal limits and stable. Enlarging right pleural effusion with overlying atelectasis and or infiltrate. The left lung is clear.  IMPRESSION: Enlarging right pleural effusion and persistent right lower lobe atelectasis or infiltrate.   Electronically Signed   By: Marijo Sanes M.D.   On: 04/17/2015 13:37   US THORACENTESIS ASP PLEURAL SPACE W/IMG GUIDE  COMPARISON: None.  MEDICATIONS: 10 cc 1% lidocaine  COMPLICATIONS: None immediate  TECHNIQUE: Informed written consent was obtained from the patient after a discussion of the risks, benefits and alternatives to treatment. A timeout was performed prior to the initiation of the procedure.  Initial ultrasound scanning demonstrates a R pleural effusion. The lower chest was prepped and draped in the usual sterile fashion. 1% lidocaine was used for local anesthesia.  Under direct  ultrasound guidance, a 19 gauge, 7-cm, Yueh catheter was introduced. An ultrasound image was saved for documentation purposes. The thoracentesis was performed. The catheter was removed and a dressing was applied. The patient tolerated the procedure well without immediate post procedural complication. The patient was escorted to have an upright chest radiograph.  FINDINGS: A total of approximately 1.3 liters of bloody fluid was removed. Requested samples were sent to the laboratory.  IMPRESSION: Successful ultrasound-guided R sided thoracentesis yielding 1.3 liters of pleural fluid.    PRIOR STUDIES:  EXAM: CT-GUIDED BIOPSY OF A LEFT UPPER QUADRANT PERITONEAL IMPLANT. CORE. 04-04-15  MEDICATIONS AND MEDICAL HISTORY: Versed 1 mg, Fentanyl 50 mcg.  Additional Medications: None.  ANESTHESIA/SEDATION: Moderate sedation time: 10 minutes  PROCEDURE: The procedure, risks, benefits, and alternatives were explained to the patient. Questions regarding the procedure were encouraged and answered. The patient understands and consents to the procedure.  The left upper quadrant was prepped with Betadine in a sterile fashion, and a sterile drape was applied covering the operative field. A sterile gown and sterile gloves were used for the procedure.  Under CT guidance, a(n) 17 gauge guide needle was advanced into the left upper quadrant anterior peritoneal implant. Subsequently four 18 gauge core biopsies were obtained. The guide needle was removed. Final imaging was performed.  Patient tolerated the procedure well without complication. Vital sign monitoring by nursing staff during the procedure will continue as patient is in the special procedures unit for post procedure observation.  FINDINGS: The images document guide needle placement within the peritoneal implant. Post biopsy images demonstrate no hemorrhage.  COMPLICATIONS: None  IMPRESSION: Successful  CT-guided core biopsy of a peritoneal implant.    EXAM: CT ABDOMEN AND PELVIS WITH CONTRAST  03-31-15  TECHNIQUE: Multidetector CT imaging of the abdomen and pelvis was performed using the standard protocol following bolus administration of intravenous contrast.  CONTRAST: 194m OMNIPAQUE IOHEXOL 300 MG/ML SOLN  COMPARISON: CT of the chest on 03/29/2015  FINDINGS: Stable to slightly larger moderate right pleural effusion at the right lung base causing compressive atelectasis of the right lower lobe.  There is a small amount of ascites scattered throughout the peritoneal cavity. There is extensive tumor throughout the peritoneal cavity with diffuse carcinomatosis present as well as enhancing solid tumor implants abutting the diaphragm in the right perihepatic space as well as solid tumor implants in the omentum. The largest tumor implants are a 3.4 cm mass in the left anterior mid abdomen just deep to the abdominal wall and a 4 cm implant in the midline lower anterior pelvis just deep to the abdominal wall.  Etiology of malignancy is likely gynecologic carcinoma  with a large and indistinct adnexal mass identified in the central pelvis measuring approximately 8.8 x 10.7 x 12.3 cm. This mass most likely originates from the right ovary based on positioning but could also potentially represent a uterine malignancy. The mass demonstrates solid tissue as well as cystic areas. There are some scattered lymph nodes throughout the mesentery. No enlarged retroperitoneal or inguinal lymph nodes are seen.  The liver shows no focal lesions. The gallbladder, pancreas, spleen, adrenal glands and kidneys are unremarkable. No bony lesions are seen. No vascular abnormalities. No evidence of bowel obstruction or perforation. A gastric band shows normal positioning with intact tubing to a subcutaneous port in the right lower abdominal wall. Bony structures show degenerative disc  disease of the lumbar spine, most prominently at L5-S1. There is no evidence of bone metastases.  IMPRESSION: 1. Stable to slightly larger right pleural effusion. 2. 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 This likely originates from the right ovary but could also potentially represent uterine malignancy. There is an associated small amount of ascites.   EXAM: CT CHEST WITHOUT CONTRAST 03-29-15  TECHNIQUE: Multidetector CT imaging of the chest was performed following the standard protocol without IV contrast.  COMPARISON: Chest radiograph on 03/27/2015  FINDINGS: Mediastinum/Lymph Nodes: No masses or pathologically enlarged lymph nodes identified on this unenhanced exam.  Lungs/Pleura: A small to moderate right-sided pleural effusion is seen. Compressive atelectasis is seen in the dependent right lower lobe. No evidence of pulmonary consolidation or mass.  Musculoskeletal/Soft Tissues: No suspicious bone lesions or other significant chest wall abnormality.  Upper Abdomen: Gastric lap band seen in expected position. Abdominal ascites noted, as well as soft tissue stranding within the greater omentum. Peritoneal carcinomatosis cannot be excluded.  IMPRESSION: Small to moderate right pleural effusion with compressive right lower lobe atelectasis. No radiographic evidence of pulmonary neoplasm or consolidation.  Abdominal ascites, with stranding seen in the greater omentum. Peritoneal carcinomatosis cannot be excluded. Consider abdomen pelvis CT with contrast for further evaluation.  EXAM: CHEST 2 VIEW  03-27-15  COMPARISON: 08/05/2014  FINDINGS: Cardiomediastinal silhouette is stable. There is small right pleural effusion with  right basilar atelectasis or infiltrate. No pulmonary edema. Bony thorax is stable.  IMPRESSION: Small right pleural effusion with right basilar atelectasis or infiltrate.   DISCUSSION: ER records, CXR, and labs reviewed with the patient. Overall, she feels as though her breathing has improved. Discussed that she should call our office if her breathing worsens and we can schedule a thoracentesis in IR if needed. She has completed her abx today.   ANC is 6.5 today and will hold off on Granix. Repeat CBC later this week and will proceed with Granix 300 mcg daily if ANC <=2.5. Appts scheduled.  She is having pain in her legs likely due to the Taxol.  Discussed use of Claritin 10 mg daily and I have also given her a prescription for Hydrcodone/APAP 5/325 mg for severe pain. Recommend that she take with food to avoid an upset stomach.    IMPRESSION / PLAN:   1. 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 Carboplatin not given on 7/28 due to reaction to Taxol. [place]  Was 6.5 today so Granix not given,  but will recheck CBC on 8/4 and if ANC <=2.5 would begin granix 300 daily x 3 days. She will be seen again with labs on 8/8. 2.Symptomatic right pleural effusion: appreciate assistance from IR with therapeutic and diagnostic US thoracentesis. CXR shows enlarging pleural effusion, but breathing is ok. Will hold off on repeat thoracentesis, but she is to call if worsening symptoms. 3.Type 2 diabetes: managed by PCP. Expect blood sugars will be higher transiently with steroids around taxol. Will cover during treatment with SS insulin and additional IVF if needed.  4. Strong family history of breast cancer and ovarian cancer, sister and niece have had genetics testing/ may be BRCA +; at least 56 males in 2 generations with breast cancer. Genetics referral. 5.HTN, remotely saw [facility], apparently no  active CHF concerns 6.morbid obesity, post gastric banding 7. Iron deficiency anemia: source of blood loss not clear, tho history of menorrhagia prior to hysterectomy. Colonoscopy reportedly unremarkable ~ 2014. On oral iron, may need IV. 8.post hysterectomy for benign indications 1987 9.orthopedic procedure left knee remotely 10. Retinal detatchment left 2009 with minimal vision on that side. 11. Hx asthma 12.Information on advance directives given.  Patient and accompanying individuals have had questions answered to their satisfaction and are in agreement with plan above. They can contact this office for questions or concerns at any time prior to next scheduled visit.   Cc Drs Deborra Medina, ClarkePearson Time spent  30 min , including >50% discussion and coordination of care.    Mikey Bussing, DNP, AGPCNP-BC, AOCNP 04/17/2015 8:32 PM

## 2015-04-17 NOTE — Progress Notes (Signed)
ANC 6.5 today.  Note from 7/29 states that she was to get Granix if is >=2.5.  Will not get Granix today.

## 2015-04-17 NOTE — Telephone Encounter (Signed)
Appointments made and avs pritned for patient °

## 2015-04-18 ENCOUNTER — Telehealth: Payer: Self-pay | Admitting: *Deleted

## 2015-04-18 ENCOUNTER — Telehealth: Payer: Self-pay | Admitting: Oncology

## 2015-04-18 NOTE — Telephone Encounter (Signed)
LEFT MESSAGE ON VOICE MAIL THAT PT. HAS APPOINTMENTS ON 04/20/15 10:00AM FOR LAB AND 10:30AM FOR INJECTION.

## 2015-04-18 NOTE — Telephone Encounter (Signed)
s.w. pt and advised on added inj on 8.4.Marland KitchenMarland KitchenMarland Kitchenpt ok and aware

## 2015-04-20 ENCOUNTER — Telehealth: Payer: Self-pay | Admitting: Oncology

## 2015-04-20 ENCOUNTER — Ambulatory Visit: Payer: Medicare Other

## 2015-04-20 ENCOUNTER — Other Ambulatory Visit (HOSPITAL_BASED_OUTPATIENT_CLINIC_OR_DEPARTMENT_OTHER): Payer: Medicare Other

## 2015-04-20 ENCOUNTER — Other Ambulatory Visit: Payer: Self-pay | Admitting: *Deleted

## 2015-04-20 ENCOUNTER — Telehealth: Payer: Self-pay | Admitting: *Deleted

## 2015-04-20 ENCOUNTER — Encounter: Payer: Self-pay | Admitting: Internal Medicine

## 2015-04-20 DIAGNOSIS — J948 Other specified pleural conditions: Secondary | ICD-10-CM

## 2015-04-20 DIAGNOSIS — C786 Secondary malignant neoplasm of retroperitoneum and peritoneum: Secondary | ICD-10-CM

## 2015-04-20 DIAGNOSIS — C569 Malignant neoplasm of unspecified ovary: Secondary | ICD-10-CM

## 2015-04-20 DIAGNOSIS — C801 Malignant (primary) neoplasm, unspecified: Secondary | ICD-10-CM | POA: Diagnosis not present

## 2015-04-20 LAB — CBC WITH DIFFERENTIAL/PLATELET
BASO%: 0.4 % (ref 0.0–2.0)
Basophils Absolute: 0 10*3/uL (ref 0.0–0.1)
EOS%: 1.2 % (ref 0.0–7.0)
Eosinophils Absolute: 0.1 10*3/uL (ref 0.0–0.5)
HCT: 31 % — ABNORMAL LOW (ref 34.8–46.6)
HGB: 10 g/dL — ABNORMAL LOW (ref 11.6–15.9)
LYMPH%: 16.5 % (ref 14.0–49.7)
MCH: 24 pg — ABNORMAL LOW (ref 25.1–34.0)
MCHC: 32.3 g/dL (ref 31.5–36.0)
MCV: 74.3 fL — ABNORMAL LOW (ref 79.5–101.0)
MONO#: 0.2 10*3/uL (ref 0.1–0.9)
MONO%: 3.3 % (ref 0.0–14.0)
NEUT%: 78.6 % — ABNORMAL HIGH (ref 38.4–76.8)
NEUTROS ABS: 3.9 10*3/uL (ref 1.5–6.5)
PLATELETS: 414 10*3/uL — AB (ref 145–400)
RBC: 4.17 10*6/uL (ref 3.70–5.45)
RDW: 14.6 % — AB (ref 11.2–14.5)
WBC: 4.9 10*3/uL (ref 3.9–10.3)
lymph#: 0.8 10*3/uL — ABNORMAL LOW (ref 0.9–3.3)
nRBC: 0 % (ref 0–0)

## 2015-04-20 NOTE — Telephone Encounter (Signed)
RECORDS FAXED TO [facility] NUMBER: [phone]

## 2015-04-20 NOTE — Telephone Encounter (Signed)
-----   Message from Maryanna Shape, NP sent at 04/17/2015  9:01 PM EDT ----- Regarding: update Louise/Storie Heffern  Wanted to send an update about Ms. Destin. Saw her today and breathing was better even though some increase in the R pleural effusion. I did not schedule for a thoracentesis since her symptoms weren't bad. She was instructed to call if worsening SOB.   Could one of you check in with her Thursday as well? She is coming in for CBC and possible Granix if she meets parameters. I have alerted injection nurse about her. Also, please check on pain. Had a lot of pain in her legs/knees. Told her to take Claritin and also gave RX for Hydrocodone. I think she is hesitant to take, but need to encourage her if she is having pain.  You can all me if questions. Leave message if I don't answer. I can give order for thoracentesis if needed. [phone]  Thanks Erasmo Downer

## 2015-04-20 NOTE — Progress Notes (Signed)
ANC 3.9 today  Orders for Granix if ANC <=2.5.  Doesn't need Granix today.  Return as scheduled.

## 2015-04-20 NOTE — Telephone Encounter (Signed)
Called to check on patient's status as noted below by Mikey Bussing, NP. Patient states that her SOB is slightly worse and she thinks she will need a thoracentesis prior to the weekend. Order placed and patient scheduled for thoracentesis tomorrow at [facility] at 11am (with 10:45am arrival). Patient states her pain is much better since seeing Erasmo Downer on Monday. She reports taking Vicodin 4 times since that visit and states her pain is worse at bedtime. Encouraged patient to continue taking the pain medication as needed and also told her she could try a warm tub bath or heating pad. No other concerns voiced at this time and pt agreeable to scheduled thoracentesis tomorrow.

## 2015-04-21 ENCOUNTER — Ambulatory Visit (HOSPITAL_COMMUNITY)
Admission: RE | Admit: 2015-04-21 | Discharge: 2015-04-21 | Disposition: A | Discharge status: Home / Self Care | Payer: Medicare Other | Source: Ambulatory Visit | Attending: Radiology | Admitting: Radiology

## 2015-04-21 ENCOUNTER — Ambulatory Visit (HOSPITAL_COMMUNITY)
Admission: RE | Admit: 2015-04-21 | Discharge: 2015-04-21 | Disposition: A | Discharge status: Home / Self Care | Payer: Medicare Other | Source: Ambulatory Visit | Attending: Oncology | Admitting: Oncology

## 2015-04-21 DIAGNOSIS — C569 Malignant neoplasm of unspecified ovary: Secondary | ICD-10-CM | POA: Insufficient documentation

## 2015-04-21 DIAGNOSIS — J948 Other specified pleural conditions: Secondary | ICD-10-CM | POA: Diagnosis present

## 2015-04-21 DIAGNOSIS — Z9889 Other specified postprocedural states: Secondary | ICD-10-CM | POA: Diagnosis not present

## 2015-04-21 NOTE — Procedures (Signed)
Successful US guided right thoracentesis. Yielded 1.9 liters of bloody fluid. Pt tolerated procedure well. No immediate complications.  Specimen was not sent for labs. CXR ordered.  Tsosie Billing D PA-C 04/21/2015 11:16 AM

## 2015-04-23 ENCOUNTER — Other Ambulatory Visit: Payer: Self-pay

## 2015-04-23 ENCOUNTER — Emergency Department (HOSPITAL_COMMUNITY): Payer: Medicare Other

## 2015-04-23 ENCOUNTER — Emergency Department (HOSPITAL_COMMUNITY)
Admission: EM | Admit: 2015-04-23 | Discharge: 2015-04-23 | Disposition: A | Discharge status: Home / Self Care | Payer: Medicare Other | Attending: Emergency Medicine | Admitting: Emergency Medicine

## 2015-04-23 ENCOUNTER — Other Ambulatory Visit: Payer: Self-pay | Admitting: Oncology

## 2015-04-23 ENCOUNTER — Encounter (HOSPITAL_COMMUNITY): Payer: Self-pay

## 2015-04-23 DIAGNOSIS — I509 Heart failure, unspecified: Secondary | ICD-10-CM | POA: Diagnosis not present

## 2015-04-23 DIAGNOSIS — Z8543 Personal history of malignant neoplasm of ovary: Secondary | ICD-10-CM | POA: Diagnosis not present

## 2015-04-23 DIAGNOSIS — J45909 Unspecified asthma, uncomplicated: Secondary | ICD-10-CM | POA: Diagnosis not present

## 2015-04-23 DIAGNOSIS — E860 Dehydration: Secondary | ICD-10-CM

## 2015-04-23 DIAGNOSIS — Z872 Personal history of diseases of the skin and subcutaneous tissue: Secondary | ICD-10-CM | POA: Insufficient documentation

## 2015-04-23 DIAGNOSIS — R1110 Vomiting, unspecified: Secondary | ICD-10-CM | POA: Diagnosis not present

## 2015-04-23 DIAGNOSIS — Z8669 Personal history of other diseases of the nervous system and sense organs: Secondary | ICD-10-CM | POA: Insufficient documentation

## 2015-04-23 DIAGNOSIS — R42 Dizziness and giddiness: Secondary | ICD-10-CM | POA: Diagnosis present

## 2015-04-23 DIAGNOSIS — Z79899 Other long term (current) drug therapy: Secondary | ICD-10-CM | POA: Diagnosis not present

## 2015-04-23 DIAGNOSIS — D649 Anemia, unspecified: Secondary | ICD-10-CM | POA: Insufficient documentation

## 2015-04-23 DIAGNOSIS — Z794 Long term (current) use of insulin: Secondary | ICD-10-CM | POA: Diagnosis not present

## 2015-04-23 DIAGNOSIS — I10 Essential (primary) hypertension: Secondary | ICD-10-CM | POA: Insufficient documentation

## 2015-04-23 DIAGNOSIS — E785 Hyperlipidemia, unspecified: Secondary | ICD-10-CM | POA: Insufficient documentation

## 2015-04-23 DIAGNOSIS — E119 Type 2 diabetes mellitus without complications: Secondary | ICD-10-CM | POA: Insufficient documentation

## 2015-04-23 DIAGNOSIS — F329 Major depressive disorder, single episode, unspecified: Secondary | ICD-10-CM | POA: Insufficient documentation

## 2015-04-23 DIAGNOSIS — F419 Anxiety disorder, unspecified: Secondary | ICD-10-CM | POA: Insufficient documentation

## 2015-04-23 DIAGNOSIS — I5032 Chronic diastolic (congestive) heart failure: Secondary | ICD-10-CM | POA: Insufficient documentation

## 2015-04-23 DIAGNOSIS — Z9221 Personal history of antineoplastic chemotherapy: Secondary | ICD-10-CM | POA: Diagnosis not present

## 2015-04-23 LAB — URINE MICROSCOPIC-ADD ON

## 2015-04-23 LAB — I-STAT CHEM 8, ED
BUN: 23 mg/dL — ABNORMAL HIGH (ref 6–20)
CHLORIDE: 99 mmol/L — AB (ref 101–111)
CREATININE: 1.1 mg/dL — AB (ref 0.44–1.00)
Calcium, Ion: 1.17 mmol/L (ref 1.13–1.30)
Glucose, Bld: 139 mg/dL — ABNORMAL HIGH (ref 65–99)
HCT: 33 % — ABNORMAL LOW (ref 36.0–46.0)
Hemoglobin: 11.2 g/dL — ABNORMAL LOW (ref 12.0–15.0)
Potassium: 5.1 mmol/L (ref 3.5–5.1)
SODIUM: 133 mmol/L — AB (ref 135–145)
TCO2: 24 mmol/L (ref 0–100)

## 2015-04-23 LAB — BASIC METABOLIC PANEL
Anion gap: 8 (ref 5–15)
BUN: 22 mg/dL — AB (ref 6–20)
CALCIUM: 8.6 mg/dL — AB (ref 8.9–10.3)
CHLORIDE: 100 mmol/L — AB (ref 101–111)
CO2: 23 mmol/L (ref 22–32)
Creatinine, Ser: 0.98 mg/dL (ref 0.44–1.00)
GFR, EST NON AFRICAN AMERICAN: 59 mL/min — AB (ref >60)
Glucose, Bld: 141 mg/dL — ABNORMAL HIGH (ref 65–99)
Potassium: 5.1 mmol/L (ref 3.5–5.1)
Sodium: 131 mmol/L — ABNORMAL LOW (ref 135–145)

## 2015-04-23 LAB — CBC
HCT: 31.2 % — ABNORMAL LOW (ref 36.0–46.0)
Hemoglobin: 9.8 g/dL — ABNORMAL LOW (ref 12.0–15.0)
MCH: 23.7 pg — ABNORMAL LOW (ref 26.0–34.0)
MCHC: 31.4 g/dL (ref 30.0–36.0)
MCV: 75.4 fL — ABNORMAL LOW (ref 78.0–100.0)
PLATELETS: 424 10*3/uL — AB (ref 150–400)
RBC: 4.14 MIL/uL (ref 3.87–5.11)
RDW: 15.2 % (ref 11.5–15.5)
WBC: 3.5 10*3/uL — ABNORMAL LOW (ref 4.0–10.5)

## 2015-04-23 LAB — I-STAT CG4 LACTIC ACID, ED
LACTIC ACID, VENOUS: 4.47 mmol/L — AB (ref 0.5–2.0)
Lactic Acid, Venous: 1.92 mmol/L (ref 0.5–2.0)

## 2015-04-23 LAB — TROPONIN I: Troponin I: <0.03 ng/mL (ref <0.031)

## 2015-04-23 LAB — URINALYSIS, ROUTINE W REFLEX MICROSCOPIC
Bilirubin Urine: NEGATIVE
GLUCOSE, UA: NEGATIVE mg/dL
Hgb urine dipstick: NEGATIVE
KETONES UR: NEGATIVE mg/dL
Nitrite: NEGATIVE
Protein, ur: NEGATIVE mg/dL
SPECIFIC GRAVITY, URINE: 1.023 (ref 1.005–1.030)
UROBILINOGEN UA: 0.2 mg/dL (ref 0.0–1.0)
pH: 5 (ref 5.0–8.0)

## 2015-04-23 MED ORDER — MORPHINE SULFATE 4 MG/ML IJ SOLN
4.0000 mg | INTRAMUSCULAR | Status: DC | PRN
  Filled 2015-04-23: qty 1

## 2015-04-23 MED ORDER — PANTOPRAZOLE SODIUM 40 MG IV SOLR
40.0000 mg | Freq: Once | INTRAVENOUS | Status: DC
  Filled 2015-04-23: qty 40

## 2015-04-23 MED ORDER — SODIUM CHLORIDE 0.9 % IV BOLUS (SEPSIS): 1000.0000 mL | Freq: Once | INTRAVENOUS | Status: DC

## 2015-04-23 MED ORDER — SODIUM CHLORIDE 0.9 % IV BOLUS (SEPSIS)
500.0000 mL | Freq: Once | INTRAVENOUS | Status: AC
  Administered 2015-04-23: 500 mL via INTRAVENOUS

## 2015-04-23 MED ORDER — SODIUM CHLORIDE 0.9 % IV SOLN: Freq: Once | INTRAVENOUS | Status: DC

## 2015-04-23 MED ORDER — ONDANSETRON HCL 4 MG/2ML IJ SOLN
4.0000 mg | Freq: Once | INTRAMUSCULAR | Status: DC
  Filled 2015-04-23: qty 2

## 2015-04-23 MED ORDER — SODIUM CHLORIDE 0.9 % IV SOLN
Freq: Once | INTRAVENOUS | Status: AC
  Administered 2015-04-23: 16:00:00 via INTRAVENOUS

## 2015-04-23 NOTE — Discharge Instructions (Signed)
Rest, avoid heat exposure. Push fluids.    Dehydration, Adult Dehydration means your body does not have as much fluid as it needs. Your kidneys, brain, and heart will not work properly without the right amount of fluids and salt.  HOME CARE  Ask your doctor how to replace body fluid losses (rehydrate).  Drink enough fluids to keep your pee (urine) clear or pale yellow.  Drink small amounts of fluids often if you feel sick to your stomach (nauseous) or throw up (vomit).  Eat like you normally do.  Avoid:  Foods or drinks high in sugar.  Bubbly (carbonated) drinks.  Juice.  Very hot or cold fluids.  Drinks with caffeine.  Fatty, greasy foods.  Alcohol.  Tobacco.  Eating too much.  Gelatin desserts.  Wash your hands to avoid spreading germs (bacteria, viruses).  Only take medicine as told by your doctor.  Keep all doctor visits as told. GET HELP RIGHT AWAY IF:   You cannot drink something without throwing up.  You get worse even with treatment.  Your vomit has blood in it or looks greenish.  Your poop (stool) has blood in it or looks black and tarry.  You have not peed in 6 to 8 hours.  You pee a small amount of very dark pee.  You have a fever.  You pass out (faint).  You have belly (abdominal) pain that gets worse or stays in one spot (localizes).  You have a rash, stiff neck, or bad headache.  You get easily annoyed, sleepy, or are hard to wake up.  You feel weak, dizzy, or very thirsty. MAKE SURE YOU:   Understand these instructions.  Will watch your condition.  Will get help right away if you are not doing well or get worse. Document Released: 06/29/2009 Document Revised: 11/25/2011 Document Reviewed: 04/22/2011 [facility] Patient Information 2015 [place], [place]. This information is not intended to replace advice given to you by your health care provider. Make sure you discuss any questions you have with your health care provider.

## 2015-04-23 NOTE — ED Notes (Signed)
Pt c/o lightheadedness and "feeling like I'm going to faint" starting around 1430.  Denies pain.  Denies n/v/d.  Last chemo 8/1 and had fluid removed from behind lungs 8/5.

## 2015-04-23 NOTE — ED Notes (Signed)
Pt's son reports that she was very active today.  Sts "she hasn't been this active since mid-July."

## 2015-04-23 NOTE — ED Provider Notes (Signed)
CSN: [id number]     Arrival date & time 04/23/15  1447 History   First MD Initiated Contact with Patient 04/23/15 1456     Chief Complaint  Patient presents with  . Chemo Card    . Dizziness     HPI  Patient presents for evaluation of weakness and dizziness. She has a history of chemotherapy for ovarian cancer. Has developed malignant pleural effusions which she has had thoracentesis for 2. States she got up this morning. She went to church. They went out to eat. They went to [facility]. She was walking "halfway around [place]". She became weak and felt somewhat lightheaded and had to sit down. She was not dyspneic. She was not syncopal. She states she eats fairly well. She states she feels like her fluid intake is "not what it should be". No fevers no chills. No nausea or vomiting. No recent fever shakes chills.   Past Medical History  Diagnosis Date  . Chronic diastolic heart failure   . Congestive heart failure, unspecified   . Other dyspnea and respiratory abnormality   . Essential hypertension, benign   . Other and unspecified hyperlipidemia   . Type II or unspecified type diabetes mellitus with peripheral circulatory disorders, not stated as uncontrolled(250.70)   . Anxiety state, unspecified   . Depressive disorder, not elsewhere classified   . Unspecified asthma, with exacerbation   . Recent retinal detachment, partial, with giant tear   . Anemia, unspecified   . Wheezing   . Acute upper respiratory infections of unspecified site   . Contact dermatitis and other eczema, due to unspecified cause   . Other diseases of lung, not elsewhere classified   . Unspecified urinary incontinence   . Cellulitis   . Mastoiditis   . Diabetes mellitus    Past Surgical History  Procedure Laterality Date  . Knee surgery  2003    meniscus  . Eye surgery      Left retina repair- 11/09, 02-20-09  . Laparoscopic gastric banding  08/21/10  . Abdominal hysterectomy    . Dilation and curettage of  uterus     Family History  Problem Relation Age of Onset  . Cancer Mother     breast  . Cancer Sister     breast  . Cancer Maternal Aunt     breast  . Cancer Maternal Uncle     breast  . Cancer Maternal Grandmother     breast  . Cancer Maternal Uncle    History  Substance Use Topics  . Smoking status: Never Smoker   . Smokeless tobacco: Never Used  . Alcohol Use: No   OB History    No data available     Review of Systems  Constitutional: Negative for fever, chills, diaphoresis, appetite change and fatigue.  HENT: Negative for mouth sores, sore throat and trouble swallowing.   Eyes: Negative for visual disturbance.  Respiratory: Negative for cough, chest tightness, shortness of breath and wheezing.   Cardiovascular: Negative for chest pain.  Gastrointestinal: Negative for nausea, vomiting, abdominal pain, diarrhea and abdominal distention.  Endocrine: Negative for polydipsia, polyphagia and polyuria.  Genitourinary: Negative for dysuria, frequency and hematuria.  Musculoskeletal: Negative for gait problem.  Skin: Negative for color change, pallor and rash.  Neurological: Positive for dizziness and light-headedness. Negative for syncope and headaches.  Hematological: Does not bruise/bleed easily.  Psychiatric/Behavioral: Negative for behavioral problems and confusion.      Allergies  Aspirin and Codeine  Home Medications  Prior to Admission medications   Medication Sig Start Date End Date Taking? Authorizing Provider  busPIRone (BUSPAR) 10 MG tablet Take 1 tablet (10 mg total) by mouth at bedtime. 01/09/15  Yes Lucille Passy, MD  cycloSPORINE (RESTASIS) 0.05 % ophthalmic emulsion Place 1 drop into both eyes 2 (two) times daily.   Yes Historical Provider, MD  ferrous fumarate (HEMOCYTE - 106 MG FE) 325 (106 FE) MG TABS tablet Take 1 tablet on an empty stomach with OJ or Vitamin C tablet 04/07/15  Yes Lennis P Livesay, MD  FLUoxetine (PROZAC) 20 MG capsule Take 3  capsules (60 mg total) by mouth daily. 01/09/15  Yes Lucille Passy, MD  glyBURIDE (DIABETA) 5 MG tablet Take 1 tablet (5 mg total) by mouth 2 (two) times daily with a meal. 01/09/15  Yes Lucille Passy, MD  HYDROcodone-acetaminophen (NORCO/VICODIN) 5-325 MG per tablet Take 1 tablet by mouth every 6 (six) hours as needed. 04/17/15  Yes Maryanna Shape, NP  insulin glargine (LANTUS) 100 UNIT/ML injection Inject 15 Units into the skin at bedtime.   Yes Historical Provider, MD  lisinopril (PRINIVIL,ZESTRIL) 10 MG tablet Take 1 tablet (10 mg total) by mouth daily. 01/09/15  Yes Lucille Passy, MD  lovastatin (MEVACOR) 20 MG tablet Take 1 tablet (20 mg total) by mouth at bedtime. 01/09/15  Yes Lucille Passy, MD  metFORMIN (GLUCOPHAGE) 500 MG tablet Take 1 tablet (500 mg total) by mouth 2 (two) times daily with a meal. 01/09/15  Yes Lucille Passy, MD  ranitidine (ZANTAC) 150 MG tablet Take 1 tablet (150 mg total) by mouth 2 (two) times daily. 11/14/14  Yes Lucille Passy, MD  azithromycin (ZITHROMAX) 250 MG tablet Take 1 tablet (250 mg total) by mouth daily. Take  1 every day until finished. Patient not taking: Reported on 04/23/2015 04/13/15   Malvin Johns, MD  dexamethasone (DECADRON) 4 MG tablet Take 5 tablets with food 12 hrs and 6 hrs prior to Taxol Chemotherapy 04/07/15   Lennis Marion Downer, MD  glucose blood (ONE TOUCH ULTRA TEST) test strip Check blood sugar four times a day. Dx 250.70 Patient taking differently: 1 each by Other route 2 (two) times daily. Check blood sugar in the am . Dx 250.70 08/27/12   Lucille Passy, MD  ibuprofen (ADVIL,MOTRIN) 200 MG tablet Take 200 mg by mouth every 6 (six) hours as needed for headache.    Historical Provider, MD  LORazepam (ATIVAN) 0.5 MG tablet Place 1 tablet under the tongue to dissolve or swallow every 6 hrs as needed for nausea. Will make you Drowsy 04/07/15   Lennis Marion Downer, MD  ondansetron (ZOFRAN) 8 MG tablet Take 1 tablet (8 mg total) by mouth every 8 (eight) hours as  needed for nausea or vomiting. Will not make drowsy. 04/07/15   Lennis Marion Downer, MD  zoster vaccine live, PF, (ZOSTAVAX) 47654 UNT/0.65ML injection Inject 19,400 Units into the skin once. Patient not taking: Reported on 04/23/2015 01/09/15   Lucille Passy, MD   BP 138/50 mmHg  Pulse 95  Temp(Src) 97.9 F (36.6 C) (Oral)  Resp 19  SpO2 99% Physical Exam  Constitutional: She is oriented to person, place, and time. She appears well-developed and well-nourished. No distress.  HENT:  Head: Normocephalic.  Eyes: Conjunctivae are normal. Pupils are equal, round, and reactive to light. No scleral icterus.  Neck: Normal range of motion. Neck supple. No thyromegaly present.  Cardiovascular: Normal rate and regular rhythm.  Exam reveals no gallop and no friction rub.   No murmur heard. Pulmonary/Chest: Effort normal and breath sounds normal. No respiratory distress. She has no wheezes. She has no rales.  Abdominal: Soft. Bowel sounds are normal. She exhibits no distension. There is no tenderness. There is no rebound.  Musculoskeletal: Normal range of motion.  Neurological: She is alert and oriented to person, place, and time.  Skin: Skin is warm and dry. No rash noted.  Psychiatric: She has a normal mood and affect. Her behavior is normal.    ED Course  Procedures (including critical care time) Labs Review Labs Reviewed  BASIC METABOLIC PANEL - Abnormal; Notable for the following:    Sodium 131 (*)    Chloride 100 (*)    Glucose, Bld 141 (*)    BUN 22 (*)    Calcium 8.6 (*)    GFR calc non Af Amer 59 (*)    All other components within normal limits  CBC - Abnormal; Notable for the following:    WBC 3.5 (*)    Hemoglobin 9.8 (*)    HCT 31.2 (*)    MCV 75.4 (*)    MCH 23.7 (*)    Platelets 424 (*)    All other components within normal limits  URINALYSIS, ROUTINE W REFLEX MICROSCOPIC (NOT AT [facility]) - Abnormal; Notable for the following:    Color, Urine AMBER (*)    APPearance CLOUDY (*)     Leukocytes, UA SMALL (*)    All other components within normal limits  URINE MICROSCOPIC-ADD ON - Abnormal; Notable for the following:    Casts HYALINE CASTS (*)    All other components within normal limits  I-STAT CG4 LACTIC ACID, ED - Abnormal; Notable for the following:    Lactic Acid, Venous 4.47 (*)    All other components within normal limits  I-STAT CHEM 8, ED - Abnormal; Notable for the following:    Sodium 133 (*)    Chloride 99 (*)    BUN 23 (*)    Creatinine, Ser 1.10 (*)    Glucose, Bld 139 (*)    Hemoglobin 11.2 (*)    HCT 33.0 (*)    All other components within normal limits  TROPONIN I  I-STAT CG4 LACTIC ACID, ED    Imaging Review Dg Abd Acute W/chest  04/23/2015   CLINICAL DATA:  Patient with lightheadedness and chest tightness. History of ovarian cancer.  EXAM: DG ABDOMEN ACUTE W/ 1V CHEST  COMPARISON:  Chest radiograph 04/21/2015  FINDINGS: Monitoring leads overlie the patient. Stable cardiac and mediastinal contours. Elevation of the right hemidiaphragm. Small right pleural effusion. Minimal heterogeneous opacities right lung base. Gas is demonstrated within nondilated loops of large and small bowel in a nonobstructed pattern. Stool is demonstrated throughout the colon. No free intraperitoneal air. Osseous skeleton is unremarkable. Gastric band.  IMPRESSION: Small right pleural effusion right basilar atelectasis.  Nonobstructed bowel gas pattern.   Electronically Signed   By: Lovey Newcomer M.D.   On: 04/23/2015 16:32     EKG Interpretation   Date/Time:  Sunday April 23 2015 14:58:45 EDT Ventricular Rate:  102 PR Interval:  161 QRS Duration: 87 QT Interval:  354 QTC Calculation: 461 R Axis:   -14 Text Interpretation:  Sinus tachycardia Inferior infarct, old similar  inferior changes noted 2004 Confirmed by Jeneen Rinks  MD, [place] ([id number]) on  04/23/2015 4:29:56 PM      MDM   Final diagnoses:  Vomiting  Dehydration  Reassuring labs. Afebrile. Adequate blood  counts. Hydrated. Initial lactate elevated. Second lactate normal. Has urinated 2. Mandatory without symptoms. Not orthostatic by numbers her symptoms. I think should appropriate for discharge home continue fluid hydration. Primary care follow-up.    Tanna Furry, MD 04/23/15 [phone]

## 2015-04-24 ENCOUNTER — Other Ambulatory Visit (HOSPITAL_BASED_OUTPATIENT_CLINIC_OR_DEPARTMENT_OTHER): Payer: Medicare Other

## 2015-04-24 ENCOUNTER — Encounter: Payer: Self-pay | Admitting: Oncology

## 2015-04-24 ENCOUNTER — Telehealth: Payer: Self-pay | Admitting: Oncology

## 2015-04-24 ENCOUNTER — Ambulatory Visit (HOSPITAL_BASED_OUTPATIENT_CLINIC_OR_DEPARTMENT_OTHER): Payer: Medicare Other | Admitting: Oncology

## 2015-04-24 VITALS — BP 145/53 | HR 97 | Temp 98.1°F | Resp 18 | Ht 67.0 in | Wt 209.5 lb

## 2015-04-24 DIAGNOSIS — J90 Pleural effusion, not elsewhere classified: Secondary | ICD-10-CM

## 2015-04-24 DIAGNOSIS — J910 Malignant pleural effusion: Secondary | ICD-10-CM

## 2015-04-24 DIAGNOSIS — C786 Secondary malignant neoplasm of retroperitoneum and peritoneum: Secondary | ICD-10-CM

## 2015-04-24 DIAGNOSIS — C569 Malignant neoplasm of unspecified ovary: Secondary | ICD-10-CM

## 2015-04-24 DIAGNOSIS — C801 Malignant (primary) neoplasm, unspecified: Secondary | ICD-10-CM | POA: Diagnosis not present

## 2015-04-24 DIAGNOSIS — D509 Iron deficiency anemia, unspecified: Secondary | ICD-10-CM

## 2015-04-24 DIAGNOSIS — Z5111 Encounter for antineoplastic chemotherapy: Secondary | ICD-10-CM

## 2015-04-24 DIAGNOSIS — J189 Pneumonia, unspecified organism: Secondary | ICD-10-CM

## 2015-04-24 LAB — COMPREHENSIVE METABOLIC PANEL (CC13)
ALBUMIN: 3.2 g/dL — AB (ref 3.5–5.0)
ALT: 16 U/L (ref 0–55)
AST: 17 U/L (ref 5–34)
Alkaline Phosphatase: 85 U/L (ref 40–150)
Anion Gap: 7 mEq/L (ref 3–11)
BUN: 15.3 mg/dL (ref 7.0–26.0)
CHLORIDE: 106 meq/L (ref 98–109)
CO2: 23 meq/L (ref 22–29)
Calcium: 8.3 mg/dL — ABNORMAL LOW (ref 8.4–10.4)
Creatinine: 0.8 mg/dL (ref 0.6–1.1)
EGFR: 77 mL/min/{1.73_m2} — AB (ref >90)
GLUCOSE: 185 mg/dL — AB (ref 70–140)
Potassium: 4.8 mEq/L (ref 3.5–5.1)
Sodium: 136 mEq/L (ref 136–145)
Total Bilirubin: 0.22 mg/dL (ref 0.20–1.20)
Total Protein: 6.7 g/dL (ref 6.4–8.3)

## 2015-04-24 LAB — CBC WITH DIFFERENTIAL/PLATELET
BASO%: 2 % (ref 0.0–2.0)
BASOS ABS: 0.1 10*3/uL (ref 0.0–0.1)
EOS ABS: 0.1 10*3/uL (ref 0.0–0.5)
EOS%: 2.8 % (ref 0.0–7.0)
HCT: 30.4 % — ABNORMAL LOW (ref 34.8–46.6)
HEMOGLOBIN: 9.5 g/dL — AB (ref 11.6–15.9)
LYMPH%: 21.3 % (ref 14.0–49.7)
MCH: 23.5 pg — AB (ref 25.1–34.0)
MCHC: 31.4 g/dL — ABNORMAL LOW (ref 31.5–36.0)
MCV: 74.9 fL — ABNORMAL LOW (ref 79.5–101.0)
MONO#: 0.3 10*3/uL (ref 0.1–0.9)
MONO%: 11.9 % (ref 0.0–14.0)
NEUT%: 62 % (ref 38.4–76.8)
NEUTROS ABS: 1.7 10*3/uL (ref 1.5–6.5)
PLATELETS: 371 10*3/uL (ref 145–400)
RBC: 4.06 10*6/uL (ref 3.70–5.45)
RDW: 16.2 % — AB (ref 11.2–14.5)
WBC: 2.7 10*3/uL — AB (ref 3.9–10.3)
lymph#: 0.6 10*3/uL — ABNORMAL LOW (ref 0.9–3.3)

## 2015-04-24 MED ORDER — DEXAMETHASONE 4 MG PO TABS: ORAL_TABLET | ORAL | Status: DC

## 2015-04-24 NOTE — Patient Instructions (Signed)
Use Claritin 10 mg daily beginning day of chemo for ~ a week, as this may help taxol aches.  If you get aches from shots for white blood cells, the claritin may also help.

## 2015-04-24 NOTE — Progress Notes (Signed)
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 She had cycle 1 taxol only on 04-13-15, carboplatin held that day due to question of taxol reaction. She has not had gCSF as yet, but will need this based on counts today when carboplatin is added with cycle 2.  Patient had [place] right thoracentesis for 1.3 liters on 7-22, 1.6 liters on 7-27 and 1.9 liters on 04-21-15. Cytology did not document malignant cells on 7-22 or 7-27.    Patient had full decadron premedication prior to receiving all of taxol for cycle 1 on 04-13-15. After completion of taxol and prior to starting carboplatin, she complained of SOB and chest heaviness, which did not resolve with usual medications for taxol reaction. With continued symptoms after observation x 50 min, she was transferred to ED, where EKG no acute changes, CT angio chest negative for PE, troponin not elevated. She was given IV rocephin and po zithromax for radiographic pneumonia by ED (no fever, no productive cough); chest symptoms did seem better after the antibiotics completed. She has had no significant nausea, tho some taste changes. She developed aches mostly in legs beginning evening day 2 thru day 7, so severe initially that she "could not walk". She did not take claritin; hydrocodone 5/325 1 tablet at hs was helpful. Aches have resolved entirely now. She saw APP for scheduled visit on 04-17-15, then right thoracentesis as above on 04-21-15.  Patient felt well enough to go to church, out to lunch and then to [place] on 04-23-15. She became "weak and dizzy" while shopping; she had not been drinking fluids well. She was taken by family  to ED, where acute abdominal xrays showed extensive stool and small right pleural effusion, and symptoms improved with IVF. Bowels have moved better with increased laxatives. She feels less full in abdomen since the chemotherapy.  Today patient denies SOB or chest discomfort walking in from lobby or with exertion in exam room. She has had some water and OJ, and has eaten.    No PAC CA 125 on 04-07-15    1142 No genetics referral yet  ONCOLOGIC HISTORY Patient had been in usual health, including fairly well controlled DM and previous gastric banding for obesity, until fairly recent increased regurgitation, SOB and abdominal fullness. She saw PCP with the respiratory complaints, with CXR 03-27-15 showing small right pleural effusion. CT chest 03-29-15 had small to moderate right pleural effusion with associated atelectasis, abdominal ascites and stranding in omentum. CT AP 03-31-15 showed diffuse carcinomatosis, small ascites, implants at diaphragm, 3.4 cm mass left anterior mid abdomen and 4 cm midline lower pelvis, adnexal mass 8.8 x 10.7 x 12.3 cm, no hepatic lesions, no bowel obstruction, kidneys not remarkable (reports below). 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 He did not feel that this could be optimally debulked at present and recommended initial treatment with chemotherapy. She had CT core needle biopsy of LUQ peritoneal implant on 04-04-15, with pathology [phone] showing poorly differentiated carcinoma with marked anaplasia and frequent mitotic figures, immunohistochemistry most consistent with gyn primary, specifically ovarian. She had first chemotherapy with taxol only on 04-13-15, [place] held with question  of taxol reaction.    Review of systems as above, also: No fever. Slight NP cough when leans down only. Sleeps supine. Some bladder incontinence, no dysuria. Some fatigue with exertion.  No new or different pain now. No bleeding. Peripheral IV access has been adequate.  Remainder of 10 point Review of Systems negative.  Objective:  Vital signs in last 24 hours:  BP 145/53 mmHg  Pulse 97  Temp(Src) 98.1 F (36.7 C) (Oral)  Resp 18  Ht _0  (1.702 m)  Wt 209 lb 8 oz (95.029 kg)  BMI 32.80 kg/m2 Weight stable from 04-17-15, down from 218 prior to thoracenteses. Alert, oriented and appropriate. Ambulatory without assistance. Looks more comfortable than at our first meeting, respirations not labored RA  No alopecia  HEENT:PERRL, sclerae not icteric. Oral mucosa moist without lesions, posterior pharynx clear.  Neck supple. No JVD.  Lymphatics:no cervical,supraclavicular or inguinal adenopathy Resp: Dullness to percussion and decreased/ absent BS lower 1/4 right lung, otherwise clear to auscultation bilaterally and normal percussion bilaterally Cardio: regular rate and rhythm. No gallop. GI: abdomen obese but not as full,  soft, nontender, no discreet mass or organomegaly. A few bowel sounds. Gastric band access palpable in right upper abdomen. Musculoskeletal/ Extremities: without pitting edema, cords, tenderness Neuro: no peripheral neuropathy. Otherwise nonfocal. PSYCH appropriate mood and affect. Skin without rash, ecchymosis, petechiae   Lab Results:  Results for orders placed or performed in visit on 04/24/15  CBC with Differential  Result Value Ref Range   WBC 2.7 (L) 3.9 - 10.3 10e3/uL   NEUT# 1.7 1.5 - 6.5 10e3/uL   HGB 9.5 (L) 11.6 - 15.9 g/dL   HCT 30.4 (L) 34.8 - 46.6 %   Platelets 371 145 - 400 10e3/uL   MCV 74.9 (L) 79.5 - 101.0 fL   MCH 23.5 (L) 25.1 - 34.0 pg   MCHC 31.4 (L) 31.5 - 36.0 g/dL   RBC 4.06 3.70 - 5.45 10e6/uL   RDW 16.2 (H) 11.2 - 14.5 %   lymph# 0.6 (L) 0.9 - 3.3 10e3/uL   MONO# 0.3 0.1 - 0.9 10e3/uL   Eosinophils Absolute 0.1 0.0 - 0.5 10e3/uL   Basophils Absolute 0.1 0.0 - 0.1 10e3/uL   NEUT% 62.0 38.4 - 76.8 %   LYMPH% 21.3  14.0 - 49.7 %   MONO% 11.9 0.0 - 14.0 %   EOS% 2.8 0.0 - 7.0 %   BASO% 2.0 0.0 - 2.0 %  Comprehensive metabolic panel (Cmet) - [facility]  Result Value Ref Range   Sodium 136 136 - 145 mEq/L   Potassium 4.8 3.5 - 5.1 mEq/L   Chloride 106 98 - 109 mEq/L   CO2 23 22 - 29 mEq/L   Glucose 185 (H) 70 - 140 mg/dl   BUN 15.3 7.0 - 26.0 mg/dL   Creatinine 0.8 0.6 - 1.1 mg/dL   Total Bilirubin 0.22 0.20 - 1.20 mg/dL   Alkaline Phosphatase 85 40 - 150 U/L   AST 17 5 - 34 U/L   ALT 16 0 - 55 U/L   Total Protein 6.7 6.4 - 8.3 g/dL   Albumin 3.2 (L) 3.5 - 5.0 g/dL   Calcium 8.3 (L) 8.4 - 10.4 mg/dL   Anion Gap 7 3 - 11 mEq/L   EGFR 77 (L) >90 ml/min/1.73 m2   UA in ED 04-23-15 not suggestive of infection   Studies/Results:  Dg Abd Acute W/chest  04/23/2015   CLINICAL DATA:  Patient with lightheadedness and chest tightness. History  of ovarian cancer.  EXAM: DG ABDOMEN ACUTE W/ 1V CHEST  COMPARISON:  Chest radiograph 04/21/2015  FINDINGS: Monitoring leads overlie the patient. Stable cardiac and mediastinal contours. Elevation of the right hemidiaphragm. Small right pleural effusion. Minimal heterogeneous opacities right lung base. Gas is demonstrated within nondilated loops of large and small bowel in a nonobstructed pattern. Stool is demonstrated throughout the colon. No free intraperitoneal air. Osseous skeleton is unremarkable. Gastric band.  IMPRESSION: Small right pleural effusion right basilar atelectasis.  Nonobstructed bowel gas pattern.   Electronically Signed   By: Lovey Newcomer M.D.   On: 04/23/2015 16:32  PACs images reviewed. Discussed atelectasis/ "collapsed lung"/ pneumonia.  At request of patient and husband, we have also reveiwed PACs images of CT AP from 03-31-15 now.  Medications: I have reviewed the patient's current medications. She will try Claritin starting evening of chemo or next AM, to see if this helps taxol aches. OK to use hydrocodone 1-2 tablets every 6 hrs as needed for severe  taxol aches; she will bring medication bottle to next visit as not clear how many tablets available.   DISCUSSION: all of interval history reviewed as above. Discussed pleurex catheter home drainage of the recurrent pleural effusion vs prn thoracenteses for now, and have decided to continue just thoracenteses as we hope this chemotherapy will improve situation given a little more time. Taxol aches and interventions discussed. Bowel program discussed. Urinary incontinence likely with pelvic involvement of tumor, suggested trying to void every 1.5 - 2 hrs thru day. Discussed good oral hydration. With ANC 1.7 on day 12 cycle 1 taxol only, will recheck CBC end of week but for now does not need granix; she will need granix after carbo + taxol, as counts will drop further with both drugs. Discussed symptomatic improvement in abdomen already.  Letter written for son out of work with ED visit and today.  Will complete FMLA forms for son.  Assessment/Plan: 1. 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 Will need good bowel regimen, claritin &/or hydrocodone for taxol aches, good hydration and gCSF with full treatment planned cycle 2. CBC 04-27-15 with granix then if [place] <=1.0, may need additional if so.  I will see her 8-15 with lab, cycle 2 due 05-04-15.  2.Symptomatic right pleural effusion: has been rapidly recurrent, but hopefully will improve in near future with ongoing chemo so will continue thoracenteses without pleurex for now. Have requested thoracentesis ~ 8-10 or 8-11 anticipating this will be needed by then (can cancel if not).  3.Type 2 diabetes: managed by PCP. Expect blood sugars will be higher transiently with steroids around taxol. Will cover during treatment with SS insulin and additional IVF if needed.  4. Strong family history of breast cancer and ovarian cancer, sister and  niece have had genetics testing/ may be BRCA +; at least 55 males in 2 generations with breast cancer. Genetics referral. 5.HTN, remotely saw [place] cardiology, apparently no active CHF concerns 6.morbid obesity, post gastric banding 7. Iron deficiency anemia: source of blood loss not clear, tho history of menorrhagia prior to hysterectomy. Colonoscopy reportedly unremarkable ~ 2014. Begin oral iron, may need IV. (serum iron 20, %sat 6 in 03-2015) 8.post hysterectomy for benign indications 1987 9.orthopedic procedure left knee remotely 10. Retinal detatchment left 2009 with minimal vision on that side. 11. Hx asthma 12.Information on advance directives given. 13. Needs genetics  referral, not addressed today  Time spent >40 min including >50% counseling and coordination of care. All questions answered to satisfaction of patient and family members. Granix orders in with parameters. Cc Drs Josephina Shih and Deborra Medina.  NOTE after visit HIM phone note 04-20-15 "records faxed to [facility]". No mention by patient of [place] appointment at this visit.     LIVESAY,LENNIS P, MD   04/24/2015, 6:02 PM

## 2015-04-24 NOTE — Telephone Encounter (Signed)
Gave avs & calendar for August °

## 2015-04-27 ENCOUNTER — Ambulatory Visit (HOSPITAL_BASED_OUTPATIENT_CLINIC_OR_DEPARTMENT_OTHER): Payer: Medicare Other

## 2015-04-27 ENCOUNTER — Other Ambulatory Visit: Payer: Self-pay | Admitting: Oncology

## 2015-04-27 ENCOUNTER — Encounter: Payer: Self-pay | Admitting: Oncology

## 2015-04-27 ENCOUNTER — Ambulatory Visit (HOSPITAL_COMMUNITY)
Admission: RE | Admit: 2015-04-27 | Discharge: 2015-04-27 | Disposition: A | Discharge status: Home / Self Care | Payer: Medicare Other | Source: Ambulatory Visit | Attending: Oncology | Admitting: Oncology

## 2015-04-27 ENCOUNTER — Ambulatory Visit (HOSPITAL_COMMUNITY)
Admission: RE | Admit: 2015-04-27 | Discharge: 2015-04-27 | Disposition: A | Discharge status: Home / Self Care | Payer: Medicare Other | Source: Ambulatory Visit | Attending: Radiology | Admitting: Radiology

## 2015-04-27 VITALS — BP 118/50 | HR 98 | Temp 98.5°F | Resp 18

## 2015-04-27 DIAGNOSIS — Z9889 Other specified postprocedural states: Secondary | ICD-10-CM

## 2015-04-27 DIAGNOSIS — C569 Malignant neoplasm of unspecified ovary: Secondary | ICD-10-CM

## 2015-04-27 DIAGNOSIS — J910 Malignant pleural effusion: Secondary | ICD-10-CM

## 2015-04-27 DIAGNOSIS — C786 Secondary malignant neoplasm of retroperitoneum and peritoneum: Secondary | ICD-10-CM | POA: Diagnosis not present

## 2015-04-27 DIAGNOSIS — J90 Pleural effusion, not elsewhere classified: Secondary | ICD-10-CM | POA: Insufficient documentation

## 2015-04-27 DIAGNOSIS — C801 Malignant (primary) neoplasm, unspecified: Secondary | ICD-10-CM | POA: Diagnosis not present

## 2015-04-27 DIAGNOSIS — Z8543 Personal history of malignant neoplasm of ovary: Secondary | ICD-10-CM | POA: Insufficient documentation

## 2015-04-27 DIAGNOSIS — D709 Neutropenia, unspecified: Secondary | ICD-10-CM

## 2015-04-27 DIAGNOSIS — J45901 Unspecified asthma with (acute) exacerbation: Secondary | ICD-10-CM

## 2015-04-27 LAB — CBC WITH DIFFERENTIAL/PLATELET
BASO%: 3.5 % — ABNORMAL HIGH (ref 0.0–2.0)
BASOS ABS: 0.1 10*3/uL (ref 0.0–0.1)
EOS%: 3.1 % (ref 0.0–7.0)
Eosinophils Absolute: 0.1 10*3/uL (ref 0.0–0.5)
HCT: 32.4 % — ABNORMAL LOW (ref 34.8–46.6)
HEMOGLOBIN: 10.1 g/dL — AB (ref 11.6–15.9)
LYMPH%: 32.9 % (ref 14.0–49.7)
MCH: 23.5 pg — AB (ref 25.1–34.0)
MCHC: 31.3 g/dL — ABNORMAL LOW (ref 31.5–36.0)
MCV: 75.1 fL — AB (ref 79.5–101.0)
MONO#: 0.4 10*3/uL (ref 0.1–0.9)
MONO%: 17.6 % — AB (ref 0.0–14.0)
NEUT#: 1 10*3/uL — ABNORMAL LOW (ref 1.5–6.5)
NEUT%: 42.9 % (ref 38.4–76.8)
Platelets: 409 10*3/uL — ABNORMAL HIGH (ref 145–400)
RBC: 4.31 10*6/uL (ref 3.70–5.45)
RDW: 16.2 % — ABNORMAL HIGH (ref 11.2–14.5)
WBC: 2.4 10*3/uL — ABNORMAL LOW (ref 3.9–10.3)
lymph#: 0.8 10*3/uL — ABNORMAL LOW (ref 0.9–3.3)

## 2015-04-27 MED ORDER — TBO-FILGRASTIM 300 MCG/0.5ML ~~LOC~~ SOSY
300.0000 ug | PREFILLED_SYRINGE | Freq: Once | SUBCUTANEOUS | Status: AC
  Administered 2015-04-27: 300 ug via SUBCUTANEOUS
  Filled 2015-04-27: qty 0.5

## 2015-04-27 NOTE — Progress Notes (Signed)
Dr. Marko Plume notified of patient's [name] from today. Order placed for patient to return to [facility] tomorrow for Granix injection. Patient agreeable to appt at 1:15pm tomorrow. Patient given several masks to wear today at the cancer center and when she returns tomorrow. Also, reviewed neutropenic precautions with patient and family.

## 2015-04-27 NOTE — Procedures (Signed)
US guided diagnostic and therapeutic right thoracentesis performed yielding 1.7 liters dark bloody fluid. The fluid was sent to the lab for cytology. F/u CXR pending. No immediate complications.

## 2015-04-27 NOTE — Progress Notes (Signed)
Pt is approved w/ [employer] for Carboplatin and Paclitaxel from 04/27/15 to 04/25/16 or when the benefit cap has been met.  Expenses can be submitted for reimbursement for dos 01/27/15 to 04/25/16.  The amount of grant is $20,000.  Sent copy of approval letter to Arline Asp in the billing dept.

## 2015-04-27 NOTE — Patient Instructions (Signed)
Neutropenia Neutropenia is a condition that occurs when the level of a certain type of white blood cell (neutrophil) in your body becomes lower than normal. Neutrophils are made in the bone marrow and fight infections. These cells protect against bacteria and viruses. The fewer neutrophils you have, and the longer your body remains without them, the greater your risk of getting a severe infection becomes. CAUSES  The cause of neutropenia may be hard to determine. However, it is usually due to 3 main problems:   Decreased production of neutrophils. This may be due to:  Certain medicines such as chemotherapy.  Genetic problems.  Cancer.  Radiation treatments.  Vitamin deficiency.  Some pesticides.  Increased destruction of neutrophils. This may be due to:  Overwhelming infections.  Hemolytic anemia. This is when the body destroys its own blood cells.  Chemotherapy.  Neutrophils moving to areas of the body where they cannot fight infections. This may be due to:  Dialysis procedures.  Conditions where the spleen becomes enlarged. Neutrophils are held in the spleen and are not available to the rest of the body.  Overwhelming infections. The neutrophils are held in the area of the infection and are not available to the rest of the body. SYMPTOMS  There are no specific symptoms of neutropenia. The lack of neutrophils can result in an infection, and an infection can cause various problems. DIAGNOSIS  Diagnosis is made by a blood test. A complete blood count is performed. The normal level of neutrophils in human blood differs with age and race. Infants have lower counts than older children and adults. African Americans have lower counts than Caucasians or Asians. The average adult level is 1500 cells/mm3 of blood. Neutrophil counts are interpreted as follows:  Greater than 1000 cells/mm3 gives normal protection against infection.  500 to 1000 cells/mm3 gives an increased risk for  infection.  200 to 500 cells/mm3 is a greater risk for severe infection.  Lower than 200 cells/mm3 is a marked risk of infection. This may require hospitalization and treatment with antibiotic medicines. TREATMENT  Treatment depends on the underlying cause, severity, and presence of infections or symptoms. It also depends on your health. Your caregiver will discuss the treatment plan with you. Mild cases are often easily treated and have a good outcome. Preventative measures may also be started to limit your risk of infections. Treatment can include:  Taking antibiotics.  Stopping medicines that are known to cause neutropenia.  Correcting nutritional deficiencies by eating green vegetables to supply folic acid and taking vitamin B supplements.  Stopping exposure to pesticides if your neutropenia is related to pesticide exposure.  Taking a blood growth factor called sargramostim, pegfilgrastim, or filgrastim if you are undergoing chemotherapy for cancer. This stimulates white blood cell production.  Removal of the spleen if you have Felty's syndrome and have repeated infections. HOME CARE INSTRUCTIONS   Follow your caregiver's instructions about when you need to have blood work done.  Wash your hands often. Make sure others who come in contact with you also wash their hands.  Wash raw fruits and vegetables before eating them. They can carry bacteria and fungi.  Avoid people with colds or spreadable (contagious) diseases (chickenpox, herpes zoster, influenza).  Avoid large crowds.  Avoid construction areas. The dust can release fungus into the air.  Be cautious around children in daycare or school environments.  Take care of your respiratory system by coughing and deep breathing.  Bathe daily.  Protect your skin from cuts and   burns.  Do not work in the garden or with flowers and plants.  Care for the mouth before and after meals by brushing with a soft toothbrush. If you have  mucositis, do not use mouthwash. Mouthwash contains alcohol and can dry out the mouth even more.  Clean the area between the genitals and the anus (perineal area) after urination and bowel movements. Women need to wipe from front to back.  Use a water soluble lubricant during sexual intercourse and practice good hygiene after. Do not have intercourse if you are severely neutropenic. Check with your caregiver for guidelines.  Exercise daily as tolerated.  Avoid people who were vaccinated with a live vaccine in the past 30 days. You should not receive live vaccines (polio, typhoid).  Do not provide direct care for pets. Avoid animal droppings. Do not clean litter boxes and bird cages.  Do not share food utensils.  Do not use tampons, enemas, or rectal suppositories unless directed by your caregiver.  Use an electric razor to remove hair.  Wash your hands after handling magazines, letters, and newspapers. SEEK IMMEDIATE MEDICAL CARE IF:   You have a fever.  You have chills or start to shake.  You feel nauseous or vomit.  You develop mouth sores.  You develop aches and pains.  You have redness and swelling around open wounds.  Your skin is warm to the touch.  You have pus coming from your wounds.  You develop swollen lymph nodes.  You feel weak or fatigued.  You develop red streaks on the skin. MAKE SURE YOU:  Understand these instructions.  Will watch your condition.  Will get help right away if you are not doing well or get worse. Document Released: 02/22/2002 Document Revised: 11/25/2011 Document Reviewed: 03/22/2011 [facility] Patient Information 2015 [place], [place]. This information is not intended to replace advice given to you by your health care provider. Make sure you discuss any questions you have with your health care provider.  Tbo-Filgrastim injection What is this medicine? TBO-FILGRASTIM (T B O fil GRA stim) is a granulocyte colony-stimulating factor  that stimulates the growth of neutrophils, a type of white blood cell important in the body's fight against infection. It is used to reduce the incidence of fever and infection in patients with certain types of cancer who are receiving chemotherapy that affects the bone marrow. This medicine may be used for other purposes; ask your health care provider or pharmacist if you have questions. COMMON BRAND NAME(S): Granix What should I tell my health care provider before I take this medicine? They need to know if you have any of these conditions: -ongoing radiation therapy -sickle cell anemia -an unusual or allergic reaction to tbo-filgrastim, filgrastim, pegfilgrastim, other medicines, foods, dyes, or preservatives -pregnant or trying to get pregnant -breast-feeding How should I use this medicine? This medicine is for injection under the skin. If you get this medicine at home, you will be taught how to prepare and give this medicine. Refer to the Instructions for Use that come with your medication packaging. Use exactly as directed. Take your medicine at regular intervals. Do not take your medicine more often than directed. It is important that you put your used needles and syringes in a special sharps container. Do not put them in a trash can. If you do not have a sharps container, call your pharmacist or healthcare provider to get one. Talk to your pediatrician regarding the use of this medicine in children. Special care may be  needed. Overdosage: If you think you've taken too much of this medicine contact a poison control center or emergency room at once. Overdosage: If you think you have taken too much of this medicine contact a poison control center or emergency room at once. NOTE: This medicine is only for you. Do not share this medicine with others. What if I miss a dose? It is important not to miss your dose. Call your doctor or health care professional if you miss a dose. What may interact  with this medicine? This medicine may interact with the following medications: -medicines that may cause a release of neutrophils, such as lithium This list may not describe all possible interactions. Give your health care provider a list of all the medicines, herbs, non-prescription drugs, or dietary supplements you use. Also tell them if you smoke, drink alcohol, or use illegal drugs. Some items may interact with your medicine. What should I watch for while using this medicine? You may need blood work done while you are taking this medicine. What side effects may I notice from receiving this medicine? Side effects that you should report to your doctor or health care professional as soon as possible: -allergic reactions like skin rash, itching or hives, swelling of the face, lips, or tongue -shortness of breath or breathing problems -fever -pain, redness, or irritation at site where injected -pinpoint red spots on the skin -stomach or side pain, or pain at the shoulder -swelling -tiredness -trouble passing urine Side effects that usually do not require medical attention (Report these to your doctor or health care professional if they continue or are bothersome.): -bone pain -muscle pain This list may not describe all possible side effects. Call your doctor for medical advice about side effects. You may report side effects to FDA at 1-800-FDA-1088. Where should I keep my medicine? Keep out of the reach of children. Store in a refrigerator between 2 and 8 degrees C (36 and 46 degrees F). Keep in carton to protect from light. Throw away this medicine if it is left out of the refrigerator for more than 5 consecutive days. Throw away any unused medicine after the expiration date. NOTE: This sheet is a summary. It may not cover all possible information. If you have questions about this medicine, talk to your doctor, pharmacist, or health care provider.  2015, Elsevier/Gold Standard. (2013-12-23  11:52:29)

## 2015-04-28 ENCOUNTER — Ambulatory Visit (HOSPITAL_BASED_OUTPATIENT_CLINIC_OR_DEPARTMENT_OTHER): Payer: Medicare Other

## 2015-04-28 VITALS — BP 122/44 | HR 100 | Temp 98.8°F

## 2015-04-28 DIAGNOSIS — C801 Malignant (primary) neoplasm, unspecified: Principal | ICD-10-CM

## 2015-04-28 DIAGNOSIS — D701 Agranulocytosis secondary to cancer chemotherapy: Secondary | ICD-10-CM | POA: Diagnosis not present

## 2015-04-28 DIAGNOSIS — C786 Secondary malignant neoplasm of retroperitoneum and peritoneum: Secondary | ICD-10-CM

## 2015-04-28 MED ORDER — TBO-FILGRASTIM 300 MCG/0.5ML ~~LOC~~ SOSY
300.0000 ug | PREFILLED_SYRINGE | Freq: Once | SUBCUTANEOUS | Status: AC
  Administered 2015-04-28: 300 ug via SUBCUTANEOUS
  Filled 2015-04-28: qty 0.5

## 2015-04-30 ENCOUNTER — Other Ambulatory Visit: Payer: Self-pay | Admitting: Oncology

## 2015-04-30 DIAGNOSIS — C786 Secondary malignant neoplasm of retroperitoneum and peritoneum: Secondary | ICD-10-CM

## 2015-04-30 DIAGNOSIS — C801 Malignant (primary) neoplasm, unspecified: Principal | ICD-10-CM

## 2015-05-01 ENCOUNTER — Other Ambulatory Visit (HOSPITAL_BASED_OUTPATIENT_CLINIC_OR_DEPARTMENT_OTHER): Payer: Medicare Other

## 2015-05-01 ENCOUNTER — Ambulatory Visit (HOSPITAL_COMMUNITY)
Admission: RE | Admit: 2015-05-01 | Discharge: 2015-05-01 | Disposition: A | Discharge status: Home / Self Care | Payer: Medicare Other | Source: Ambulatory Visit | Attending: Oncology | Admitting: Oncology

## 2015-05-01 ENCOUNTER — Encounter: Payer: Self-pay | Admitting: Oncology

## 2015-05-01 ENCOUNTER — Ambulatory Visit (HOSPITAL_BASED_OUTPATIENT_CLINIC_OR_DEPARTMENT_OTHER): Payer: Medicare Other | Admitting: Oncology

## 2015-05-01 VITALS — BP 149/63 | HR 109 | Temp 98.8°F | Resp 18 | Ht 67.0 in | Wt 206.0 lb

## 2015-05-01 DIAGNOSIS — C786 Secondary malignant neoplasm of retroperitoneum and peritoneum: Secondary | ICD-10-CM

## 2015-05-01 DIAGNOSIS — E669 Obesity, unspecified: Secondary | ICD-10-CM

## 2015-05-01 DIAGNOSIS — R0602 Shortness of breath: Secondary | ICD-10-CM | POA: Diagnosis present

## 2015-05-01 DIAGNOSIS — J45901 Unspecified asthma with (acute) exacerbation: Secondary | ICD-10-CM | POA: Diagnosis not present

## 2015-05-01 DIAGNOSIS — C569 Malignant neoplasm of unspecified ovary: Secondary | ICD-10-CM

## 2015-05-01 DIAGNOSIS — D509 Iron deficiency anemia, unspecified: Secondary | ICD-10-CM | POA: Diagnosis not present

## 2015-05-01 DIAGNOSIS — J918 Pleural effusion in other conditions classified elsewhere: Secondary | ICD-10-CM

## 2015-05-01 DIAGNOSIS — E1169 Type 2 diabetes mellitus with other specified complication: Secondary | ICD-10-CM

## 2015-05-01 DIAGNOSIS — C801 Malignant (primary) neoplasm, unspecified: Secondary | ICD-10-CM

## 2015-05-01 DIAGNOSIS — J90 Pleural effusion, not elsewhere classified: Secondary | ICD-10-CM | POA: Diagnosis not present

## 2015-05-01 DIAGNOSIS — Z5111 Encounter for antineoplastic chemotherapy: Secondary | ICD-10-CM

## 2015-05-01 DIAGNOSIS — J910 Malignant pleural effusion: Secondary | ICD-10-CM

## 2015-05-01 LAB — COMPREHENSIVE METABOLIC PANEL (CC13)
ALBUMIN: 3 g/dL — AB (ref 3.5–5.0)
ALK PHOS: 117 U/L (ref 40–150)
ALT: 13 U/L (ref 0–55)
ANION GAP: 8 meq/L (ref 3–11)
AST: 13 U/L (ref 5–34)
BUN: 11.1 mg/dL (ref 7.0–26.0)
CALCIUM: 9.2 mg/dL (ref 8.4–10.4)
CHLORIDE: 103 meq/L (ref 98–109)
CO2: 25 mEq/L (ref 22–29)
Creatinine: 0.7 mg/dL (ref 0.6–1.1)
EGFR: >90 mL/min/{1.73_m2} (ref >90)
Glucose: 110 mg/dl (ref 70–140)
POTASSIUM: 4.9 meq/L (ref 3.5–5.1)
Sodium: 136 mEq/L (ref 136–145)
Total Bilirubin: <0.2 mg/dL (ref 0.20–1.20)
Total Protein: 6.9 g/dL (ref 6.4–8.3)

## 2015-05-01 LAB — CBC WITH DIFFERENTIAL/PLATELET
BASO%: 0.6 % (ref 0.0–2.0)
Basophils Absolute: 0.1 10*3/uL (ref 0.0–0.1)
EOS ABS: 0.3 10*3/uL (ref 0.0–0.5)
EOS%: 2.4 % (ref 0.0–7.0)
HCT: 33.8 % — ABNORMAL LOW (ref 34.8–46.6)
HEMOGLOBIN: 10.7 g/dL — AB (ref 11.6–15.9)
LYMPH#: 0.9 10*3/uL (ref 0.9–3.3)
LYMPH%: 7.5 % — ABNORMAL LOW (ref 14.0–49.7)
MCH: 23.4 pg — AB (ref 25.1–34.0)
MCHC: 31.6 g/dL (ref 31.5–36.0)
MCV: 74.2 fL — ABNORMAL LOW (ref 79.5–101.0)
MONO#: 0.9 10*3/uL (ref 0.1–0.9)
MONO%: 7.7 % (ref 0.0–14.0)
NEUT#: 9.9 10*3/uL — ABNORMAL HIGH (ref 1.5–6.5)
NEUT%: 81.8 % — AB (ref 38.4–76.8)
PLATELETS: 435 10*3/uL — AB (ref 145–400)
RBC: 4.55 10*6/uL (ref 3.70–5.45)
RDW: 16.4 % — AB (ref 11.2–14.5)
WBC: 12.2 10*3/uL — ABNORMAL HIGH (ref 3.9–10.3)

## 2015-05-01 MED ORDER — ALBUTEROL SULFATE (2.5 MG/3ML) 0.083% IN NEBU
2.5000 mg | INHALATION_SOLUTION | Freq: Once | RESPIRATORY_TRACT | Status: AC
  Administered 2015-05-01: 2.5 mg via RESPIRATORY_TRACT
  Filled 2015-05-01: qty 3

## 2015-05-01 MED ORDER — AZITHROMYCIN 250 MG PO TABS: ORAL_TABLET | ORAL | Status: DC

## 2015-05-01 MED ORDER — ALBUTEROL SULFATE HFA 108 (90 BASE) MCG/ACT IN AERS: 2.0000 | INHALATION_SPRAY | Freq: Four times a day (QID) | RESPIRATORY_TRACT | Status: DC | PRN

## 2015-05-01 NOTE — Progress Notes (Signed)
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 Course has been complicated by recurrent right pleural effusion, possible taxol reaction cycle 1 despite full steroid premeds, and ANC nadir of 1.0 on day 15 cycle 1 (taxol only cycle 1). Patient had granix 300 mg on days 15 and 16, with minimal aches low back subsequently, was taking claritin. She had no fever or clear symptoms of infection then. She will need gCSF with subsequent treatments, particularly with addition of carboplatin.   Patient needed repeat right thoracenteses 04-27-15, for 1.7 liters. She has previously had right thoracenteses for 1.3 liters on 7-11, 1.6 liters on 7-27 and 1.9 liters on 04-21-15.  She complains of new and increasing  NP cough for past 2-3 days, no fever, no chest pain, does have wheezing. She has history of asthma with exacerbations during other respiratory infections. She has no inhalers at home, tho she has used these for asthma in past. She was treated with 5 days of azithromycin from ED starting 04-13-15, for possible pneumonia on CT.  She has been eating and drinking fluids. Bowels are moving. She has no bleeding. She did not sleep last pm due to coughing.    No PAC CA 125 on 04-07-15 1142 No genetics referral yet  ONCOLOGIC HISTORY Patient had been in usual health, including fairly well controlled DM and previous gastric banding for obesity, until fairly recent increased regurgitation, SOB and abdominal fullness. She saw PCP with the respiratory complaints, with CXR 03-27-15 showing small right pleural effusion. CT chest 03-29-15 had small to moderate right pleural effusion with associated atelectasis, abdominal ascites and stranding  in omentum. CT AP 03-31-15 showed diffuse carcinomatosis, small ascites, implants at diaphragm, 3.4 cm mass left anterior mid abdomen and 4 cm midline lower pelvis, adnexal mass 8.8 x 10.7 x 12.3 cm, no hepatic lesions, no bowel obstruction, kidneys not remarkable (reports below). 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 He did not feel that this could be optimally debulked at present and recommended initial treatment with chemotherapy. She had CT core needle biopsy of LUQ peritoneal implant on 04-04-15, with pathology [id number] showing poorly differentiated carcinoma with marked anaplasia and frequent mitotic figures, immunohistochemistry most consistent with gyn primary, specifically ovarian. She had first chemotherapy with taxol only on 04-13-15, [place] held with question of taxol reaction. ANC was 1.0 on day 15 cycle 1 (taxol only) with granix x 2 then.     Review of systems as above, also: Starting to lose hair. No increased LE swelling. Bladder ok. No chest pain Remainder of 10 point Review of Systems negative.  Objective:  Vital signs in last 24 hours:  BP 149/63 mmHg  Pulse 109  Temp(Src) 98.8 F (37.1 C) (Oral)  Resp 18  Ht _0  (1.702 m)  Wt 206 lb (93.441 kg)  BMI 32.26 kg/m2 Weight down 3 lbs, central obesity. Alert, oriented and appropriate. Ambulatory without assistance, needs some help on and off exam table.. Frequent coughing, sounds NP. She does not look acutely ill.  No alopecia  HEENT:PERRL, sclerae not icteric. Oral mucosa moist without lesions, posterior pharynx clear.  Neck supple. No JVD.  Lymphatics:no cervical,supraclavicular, axillary or inguinal  adenopathy Resp: expiratory wheezes in all lung fields, no rales or crackles. Decreased BS and dullness in right base only otherwise normal percussion bilaterally Cardio: regular rate and rhythm. No gallop. GI: soft,  nontender, not distended, no mass or organomegaly. Normally active bowel sounds.  Musculoskeletal/ Extremities: without pitting edema, cords, tenderness Neuro: no peripheral neuropathy. Otherwise nonfocal. PSYCH appropriate mood and affect Skin without rash, ecchymosis, petechiae  Patient given albuterol nebulizer in office, with resolution of wheezing and much improved cough  Lab Results:  Results for orders placed or performed in visit on 05/01/15  CBC with Differential/Platelet  Result Value Ref Range   WBC 12.2 (H) 3.9 - 10.3 10e3/uL   NEUT# 9.9 (H) 1.5 - 6.5 10e3/uL   HGB 10.7 (L) 11.6 - 15.9 g/dL   HCT 33.8 (L) 34.8 - 46.6 %   Platelets 435 (H) 145 - 400 10e3/uL   MCV 74.2 (L) 79.5 - 101.0 fL   MCH 23.4 (L) 25.1 - 34.0 pg   MCHC 31.6 31.5 - 36.0 g/dL   RBC 4.55 3.70 - 5.45 10e6/uL   RDW 16.4 (H) 11.2 - 14.5 %   lymph# 0.9 0.9 - 3.3 10e3/uL   MONO# 0.9 0.1 - 0.9 10e3/uL   Eosinophils Absolute 0.3 0.0 - 0.5 10e3/uL   Basophils Absolute 0.1 0.0 - 0.1 10e3/uL   NEUT% 81.8 (H) 38.4 - 76.8 %   LYMPH% 7.5 (L) 14.0 - 49.7 %   MONO% 7.7 0.0 - 14.0 %   EOS% 2.4 0.0 - 7.0 %   BASO% 0.6 0.0 - 2.0 %  Comprehensive metabolic panel (Cmet) - [facility]  Result Value Ref Range   Sodium 136 136 - 145 mEq/L   Potassium 4.9 3.5 - 5.1 mEq/L   Chloride 103 98 - 109 mEq/L   CO2 25 22 - 29 mEq/L   Glucose 110 70 - 140 mg/dl   BUN 11.1 7.0 - 26.0 mg/dL   Creatinine 0.7 0.6 - 1.1 mg/dL   Total Bilirubin <0.20 0.20 - 1.20 mg/dL   Alkaline Phosphatase 117 40 - 150 U/L   AST 13 5 - 34 U/L   ALT 13 0 - 55 U/L   Total Protein 6.9 6.4 - 8.3 g/dL   Albumin 3.0 (L) 3.5 - 5.0 g/dL   Calcium 9.2 8.4 - 10.4 mg/dL   Anion Gap 8 3 - 11 mEq/L   EGFR >90 >90 ml/min/1.73 m2   CA 125 available after visit 819, this having been 1142 as baseline for chemo  Studies/Results: Patient sent from office for CXR, then seen back by MD after resulted. PACs images reviewed with patient and family. Dg Chest 2  View  05/01/2015   CLINICAL DATA:  Ovarian cancer.  Shortness of breath.  EXAM: CHEST  2 VIEW  COMPARISON:  04/27/2015, 04/21/2015, CT chest 04/13/2015  FINDINGS: There is a small right pleural effusion. There is no focal parenchymal opacity. There is no left pleural effusion or pneumothorax. The heart and mediastinal contours are unremarkable.  The osseous structures are unremarkable.  IMPRESSION: Small stable right pleural effusion.   Electronically Signed   By: Kathreen Devoid   On: 05/01/2015 13:06    Medications: I have reviewed the patient's current medications. Begin proventil inhaler 2 puffs q 6 hr and will repeat zithromax Z pack.  Reviewed instructions for decadron 20 mg with food 12 hrs and 6 hrs prior to taxol.  DISCUSSION: Reviewed gCSF as above, discussed daily granix vs neulasta; patient prefers neulasta, which  will be best given 5-7 days after treatment due to severity of taxol aches cycle 1.  No apparent pneumonia on CXR, and only slight reaccumulation of right pleural effusion since 04-27-15. She may have mild bronchitis with asthma exacerbation, or asthma exacerbation alone: will use proventil inhaler and Z pack now. Patient needs to speak with RN on 8-17, as may need to delay chemo if respiratory problems still significant.   Patient tells me today that she is to have second opinion consultation at [facility], does not know which MD, this particularly at insistence of family members. Certainly I am glad for her to have second opinion, tho we have already begun treatment. We have discussed usefulness of second opinion consultations including review of diagnostic studies, treatment recommendations on or off study, additional discussion and clarification of questions. Patient states "I just thought everyone had to have a second opinion".  We will of course be interested in recommendations from [facility]; she understands that she can transfer care at any time, tho she tells me that she does want to  continue care in [place] now.  Assessment/Plan:  1. 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 Will give cycle 2 carbo taxol with full decadron premeds on 05-04-15 as long as afebrile and afebrile/ respiratory symptoms improved. Neulasta support. I will see her 05-11-15. SS insulin with chemo based on CBG at office.  CA 125 available after visit improved with single taxol treatment. 2.Symptomatic right pleural effusion: has been rapidly recurrent, but hopefully will improve in near future with ongoing chemo so will continue thoracenteses without pleurex for now.  3.asthma exacerbation +/- mild bronchitis: as above 4.Type 2 diabetes: managed by PCP. Expect blood sugars will be higher transiently with steroids around taxol. Cover during treatment with SS insulin and additional IVF if needed.  5. Strong family history of breast cancer and ovarian cancer, sister and niece have had genetics testing/ may be BRCA +; at least 40 males in 2 generations with breast cancer. Genetics referral. 6.HTN, remotely saw [place], apparently no active CHF concerns 7.morbid obesity, post gastric banding 8. Iron deficiency anemia: source of blood loss not clear, tho history of menorrhagia prior to hysterectomy. Colonoscopy reportedly unremarkable ~ 2014. On oral iron, may need IV. (serum iron 20, %sat 6 in 03-2015) 9.post hysterectomy for benign indications 1987 10.orthopedic procedure left knee remotely 11. Retinal detatchment left 2009 with minimal vision on that side. 12.Information on advance directives given.   Neulasta preauthorization confirmed with managed care after visit, those orders placed. Chemo orders confirmed. Message to RN to speak with patient on 05-03-15.  Time spent 40+ min total, including >50% counseling and coordination of care.    Shiya Fogelman P, MD    05/01/2015, 5:30 PM

## 2015-05-02 ENCOUNTER — Telehealth: Payer: Self-pay

## 2015-05-02 ENCOUNTER — Other Ambulatory Visit: Payer: Self-pay | Admitting: Oncology

## 2015-05-02 DIAGNOSIS — C569 Malignant neoplasm of unspecified ovary: Secondary | ICD-10-CM

## 2015-05-02 LAB — CA 125: CA 125: 819 U/mL — AB (ref <35)

## 2015-05-02 MED ORDER — AZITHROMYCIN 250 MG PO TABS: ORAL_TABLET | ORAL | Status: DC

## 2015-05-02 NOTE — Telephone Encounter (Signed)
-----   Message from Gordy Levan, MD sent at 05/02/2015  9:02 AM EDT ----- Labs seen and need follow up: please let her know CA 125 marker for the cancer is down to 819 since she had the first chemo with taxol, from 1142 at presentation.  This shows that the chemo is making some progress against the cancer, and likely marker will come down even more with second chemo using carboplatin + taxol.

## 2015-05-02 NOTE — Telephone Encounter (Signed)
Told Ms. Calk the results of the CA-125 as noted below by Dr. Marko Plume. Ms. Gwynne verbalized understanding.

## 2015-05-03 ENCOUNTER — Telehealth: Payer: Self-pay

## 2015-05-03 ENCOUNTER — Encounter: Payer: Self-pay | Admitting: *Deleted

## 2015-05-03 ENCOUNTER — Telehealth: Payer: Self-pay | Admitting: Oncology

## 2015-05-03 ENCOUNTER — Ambulatory Visit (HOSPITAL_COMMUNITY)
Admission: RE | Admit: 2015-05-03 | Discharge: 2015-05-03 | Disposition: A | Discharge status: Home / Self Care | Payer: Medicare Other | Source: Ambulatory Visit | Attending: Physician Assistant | Admitting: Physician Assistant

## 2015-05-03 ENCOUNTER — Ambulatory Visit (HOSPITAL_COMMUNITY)
Admission: RE | Admit: 2015-05-03 | Discharge: 2015-05-03 | Disposition: A | Discharge status: Home / Self Care | Payer: Medicare Other | Source: Ambulatory Visit | Attending: Oncology | Admitting: Oncology

## 2015-05-03 DIAGNOSIS — Z8543 Personal history of malignant neoplasm of ovary: Secondary | ICD-10-CM | POA: Insufficient documentation

## 2015-05-03 DIAGNOSIS — C569 Malignant neoplasm of unspecified ovary: Secondary | ICD-10-CM

## 2015-05-03 DIAGNOSIS — R0600 Dyspnea, unspecified: Secondary | ICD-10-CM | POA: Diagnosis not present

## 2015-05-03 DIAGNOSIS — J90 Pleural effusion, not elsewhere classified: Secondary | ICD-10-CM | POA: Diagnosis present

## 2015-05-03 NOTE — Telephone Encounter (Signed)
Gareth Eagle PA with interventional radiology left v/m; has concern about pts BP being very low during thoracentesis done earlier today. Abigail Butts advised pt to hold BP med and call Dr Deborra Medina for further instructions. Will send note to Dr Deborra Medina who is out of office and Dr Glori Bickers.

## 2015-05-03 NOTE — Telephone Encounter (Signed)
Spoke with patients husband and she will get a new avs /schedule in chemo

## 2015-05-03 NOTE — Telephone Encounter (Signed)
-----   Message from Gordy Levan, MD sent at 05/02/2015  6:40 PM EDT ----- RN please speak with patient by phone on 8-17. OK to treat on 8-18 if cough and wheezing better, no fever and otherwise ok  Review times for decadron 20 mg with food 12 hrs and 6 hrs prior. Hopefully she has hydrocodone left from last script, as  this helped with taxol aches. Remind her to take claritin daily also. She will need to increase laxatives to keep bowels moving daily when using pain meds.   Tell her that we got neulasta approved. I would like her to have this on Monday after Rx on Thurs (delay due to taxol aches). She will need injection apt, POF done.  Ask which [facility] MD and when that second opinion consultation is please. Fine to send my notes 8-8 and 8-15.  thanks

## 2015-05-03 NOTE — Telephone Encounter (Signed)
I agree with that recommendation and will route to PCP

## 2015-05-03 NOTE — Procedures (Signed)
Successful US guided right thoracentesis. Yielded 850 mls of bloody fluid. Pt tolerated procedure well. No immediate complications.  Specimen was not sent for labs. CXR ordered.  Bari Leib S Tekela Garguilo PA-C 05/03/2015 2:24 PM

## 2015-05-03 NOTE — Progress Notes (Signed)
[place] Psychosocial Distress Screening Clinical Social Work  Clinical Social Work was referred by distress screening protocol.  The patient scored a 8 on the Psychosocial Distress Thermometer which indicates severe distress. Clinical Social Worker attempted to contact patient to assess for distress and other psychosocial needs.   ONCBCN DISTRESS SCREENING 04/24/2015  Screening Type Initial Screening  Distress experienced in past week (1-10) 8  Practical problem type Insurance  Family Problem type (No Data)  Emotional problem type Nervousness/Anxiety;Adjusting to illness  Spiritual/Religous concerns type (No Data)  Information Concerns Type Lack of info about diagnosis;Lack of info about treatment  Physical Problem type Sleep/insomnia;Getting around;Breathing  Physician notified of physical symptoms Yes  Referral to clinical social work Yes    Clinical Social Worker follow up needed: Yes.    If yes, follow up plan: CSW left voicemail requesting patient return call when convenient.  Polo Riley, MSW, LCSW, OSW-C Clinical Social Worker [facility] [phone]

## 2015-05-03 NOTE — Telephone Encounter (Addendum)
Ms. Devivo states that her cough and wheezing is better from yesterday.  She is using the inhaler which helps.  She is taking the hydrocodone for discomfort in right chest where effusion is.  She is able to breath deeper and is more comfortable. Afebrile 98.3. Pt. with plenty of Hydrocodone at home. She is experiencing some sob with exertion.  She is concerned about re accumulation of fluid in right lung especially with the weekend approaching. Set up US/ thoracentesis for today as patient 's appointment at [facility] is Friday 8-19-16at 1200 and would not be available for possible thoracentesis. [facility] does not have availability tomorrow 05-04-15 for procedure. Reviewed decadron premed with Ms. Prowse. She states that se feels well enough for treatment tomorrow 05-04-15. She is taking Claritin daily. Bowels moving well daily.  Told her to use the Senna daily if no good BM as she is using more hydrocodone and increases potential for constipation. Told her that the Neulasta was approve and she will be scheduled for it on Monday 05-08-15 as noted below by Dr. Marko Plume. Pt. to see Dr. Gershon Crane at [facility]. [phone]. Fax: [phone]. Faxed office notes dated 8-15;04-24-15 to Dr. Erasmo Leventhal as requested below by Dr. Marko Plume.

## 2015-05-04 ENCOUNTER — Other Ambulatory Visit (HOSPITAL_BASED_OUTPATIENT_CLINIC_OR_DEPARTMENT_OTHER): Payer: Medicare Other

## 2015-05-04 ENCOUNTER — Ambulatory Visit (HOSPITAL_BASED_OUTPATIENT_CLINIC_OR_DEPARTMENT_OTHER): Payer: Medicare Other

## 2015-05-04 ENCOUNTER — Ambulatory Visit: Payer: Medicare Other

## 2015-05-04 VITALS — BP 115/55 | HR 90 | Temp 98.0°F | Resp 18

## 2015-05-04 DIAGNOSIS — R739 Hyperglycemia, unspecified: Secondary | ICD-10-CM

## 2015-05-04 DIAGNOSIS — C801 Malignant (primary) neoplasm, unspecified: Principal | ICD-10-CM

## 2015-05-04 DIAGNOSIS — C786 Secondary malignant neoplasm of retroperitoneum and peritoneum: Secondary | ICD-10-CM | POA: Diagnosis not present

## 2015-05-04 DIAGNOSIS — Z5111 Encounter for antineoplastic chemotherapy: Secondary | ICD-10-CM | POA: Diagnosis not present

## 2015-05-04 LAB — CBC WITH DIFFERENTIAL/PLATELET
BASO%: 0.3 % (ref 0.0–2.0)
BASOS ABS: 0 10*3/uL (ref 0.0–0.1)
EOS ABS: 0 10*3/uL (ref 0.0–0.5)
EOS%: 0 % (ref 0.0–7.0)
HEMATOCRIT: 32.4 % — AB (ref 34.8–46.6)
HEMOGLOBIN: 10.1 g/dL — AB (ref 11.6–15.9)
LYMPH%: 3.8 % — ABNORMAL LOW (ref 14.0–49.7)
MCH: 23.2 pg — AB (ref 25.1–34.0)
MCHC: 31.2 g/dL — ABNORMAL LOW (ref 31.5–36.0)
MCV: 74.4 fL — AB (ref 79.5–101.0)
MONO#: 0.1 10*3/uL (ref 0.1–0.9)
MONO%: 1.1 % (ref 0.0–14.0)
NEUT%: 94.8 % — ABNORMAL HIGH (ref 38.4–76.8)
NEUTROS ABS: 10.6 10*3/uL — AB (ref 1.5–6.5)
PLATELETS: 350 10*3/uL (ref 145–400)
RBC: 4.35 10*6/uL (ref 3.70–5.45)
RDW: 16 % — AB (ref 11.2–14.5)
WBC: 11.2 10*3/uL — ABNORMAL HIGH (ref 3.9–10.3)
lymph#: 0.4 10*3/uL — ABNORMAL LOW (ref 0.9–3.3)

## 2015-05-04 LAB — WHOLE BLOOD GLUCOSE
GLUCOSE: 411 mg/dL — AB (ref 70–100)
GLUCOSE: 346 mg/dL — AB (ref 70–100)
HRS PC: 1 Hours
HRS PC: 1 h

## 2015-05-04 MED ORDER — SODIUM CHLORIDE 0.9 % IV SOLN
563.5000 mg | Freq: Once | INTRAVENOUS | Status: AC
  Administered 2015-05-04: 560 mg via INTRAVENOUS
  Filled 2015-05-04: qty 56

## 2015-05-04 MED ORDER — DIPHENHYDRAMINE HCL 50 MG/ML IJ SOLN
50.0000 mg | Freq: Once | INTRAMUSCULAR | Status: AC
  Administered 2015-05-04: 50 mg via INTRAVENOUS

## 2015-05-04 MED ORDER — FAMOTIDINE IN NACL 20-0.9 MG/50ML-% IV SOLN
20.0000 mg | Freq: Once | INTRAVENOUS | Status: AC
  Administered 2015-05-04: 20 mg via INTRAVENOUS

## 2015-05-04 MED ORDER — DIPHENHYDRAMINE HCL 50 MG/ML IJ SOLN
INTRAMUSCULAR | Status: AC
  Filled 2015-05-04: qty 1

## 2015-05-04 MED ORDER — INSULIN REGULAR HUMAN 100 UNIT/ML IJ SOLN
8.0000 [IU] | Freq: Once | INTRAMUSCULAR | Status: AC
  Administered 2015-05-04: 8 [IU] via SUBCUTANEOUS
  Filled 2015-05-04: qty 0.08

## 2015-05-04 MED ORDER — SODIUM CHLORIDE 0.9 % IV SOLN
Freq: Once | INTRAVENOUS | Status: AC
  Administered 2015-05-04: 10:00:00 via INTRAVENOUS

## 2015-05-04 MED ORDER — SODIUM CHLORIDE 0.9 % IV SOLN
Freq: Once | INTRAVENOUS | Status: AC
  Administered 2015-05-04: 10:00:00 via INTRAVENOUS
  Filled 2015-05-04: qty 8

## 2015-05-04 MED ORDER — SODIUM CHLORIDE 0.9 % IV SOLN
175.0000 mg/m2 | Freq: Once | INTRAVENOUS | Status: AC
  Administered 2015-05-04: 378 mg via INTRAVENOUS
  Filled 2015-05-04: qty 63

## 2015-05-04 MED ORDER — SODIUM CHLORIDE 0.9 % IV SOLN
Freq: Once | INTRAVENOUS | Status: AC
  Administered 2015-05-04: 500 mL via INTRAVENOUS

## 2015-05-04 MED ORDER — FAMOTIDINE IN NACL 20-0.9 MG/50ML-% IV SOLN
INTRAVENOUS | Status: AC
  Filled 2015-05-04: qty 50

## 2015-05-04 MED ORDER — SODIUM CHLORIDE 0.9 % IV SOLN: 1000.0000 mL | Freq: Once | INTRAVENOUS | Status: DC

## 2015-05-04 MED ORDER — INSULIN REGULAR HUMAN 100 UNIT/ML IJ SOLN
2.0000 [IU] | Freq: Once | INTRAMUSCULAR | Status: AC | PRN
  Administered 2015-05-04: 8 [IU] via SUBCUTANEOUS
  Filled 2015-05-04: qty 0.08

## 2015-05-04 NOTE — Telephone Encounter (Signed)
Attempted to contact pt; no answer or voicemail.

## 2015-05-04 NOTE — Telephone Encounter (Signed)
Please call pt check on pt.

## 2015-05-04 NOTE — Telephone Encounter (Signed)
Attempted to contact pt. No answer after multiple rings

## 2015-05-04 NOTE — Patient Instructions (Signed)
[facility] Discharge Instructions for Patients Receiving Chemotherapy  Today you received the following chemotherapy agents carboplatin/taxol  To help prevent nausea and vomiting after your treatment, we encourage you to take your nausea medication.   If you develop nausea and vomiting that is not controlled by your nausea medication, call the clinic.   BELOW ARE SYMPTOMS THAT SHOULD BE REPORTED IMMEDIATELY:  *FEVER GREATER THAN 100.5 F  *CHILLS WITH OR WITHOUT FEVER  NAUSEA AND VOMITING THAT IS NOT CONTROLLED WITH YOUR NAUSEA MEDICATION  *UNUSUAL SHORTNESS OF BREATH  *UNUSUAL BRUISING OR BLEEDING  TENDERNESS IN MOUTH AND THROAT WITH OR WITHOUT PRESENCE OF ULCERS  *URINARY PROBLEMS  *BOWEL PROBLEMS  UNUSUAL RASH Items with * indicate a potential emergency and should be followed up as soon as possible.  Feel free to call the clinic you have any questions or concerns. The clinic phone number is [phone].  Please show the CHEMO ALERT CARD at check-in to the Emergency Department and triage nurse.   

## 2015-05-05 ENCOUNTER — Encounter: Payer: Self-pay | Admitting: Oncology

## 2015-05-05 NOTE — Progress Notes (Signed)
Pt is approved for the $400 [facility] and $400 [employer] grants.  °

## 2015-05-08 ENCOUNTER — Other Ambulatory Visit: Payer: Self-pay | Admitting: Oncology

## 2015-05-08 ENCOUNTER — Ambulatory Visit: Payer: Medicare Other

## 2015-05-08 ENCOUNTER — Telehealth: Payer: Self-pay | Admitting: *Deleted

## 2015-05-08 ENCOUNTER — Telehealth: Payer: Self-pay | Admitting: Oncology

## 2015-05-08 NOTE — Telephone Encounter (Signed)
Medical Oncology  Notified by injection nurse that patient did not come for neulasta today. She had nadir ANC 1.0 after first chemo, which was taxol only. She had cycle 2 chemo with carboplatin + taxol on 05-04-15.  I spoke with patient on her cell phone now. Patient tells me that she is transferring care to [facility] "they can help me with finances". Her next apt at [facility] is 05-26-15; nothing yet in [facility] from oncology. From RN note 05-03-15, physician is Dr Gershon Crane. Information from this office was sent to Dr Erasmo Leventhal on 05-03-15.   I explained that blood counts will drop from this chemo and that she needs to have neulasta at [facility] or this office in next couple of days. She plans to call [facility] in AM, and understands that we are glad to speak with that physician or glad to give the neulasta here if needed.   Patient seemed to understand discussion. I will ask RN to follow up in next day or so, to be sure patient has gotten care organized, particularly due to recent chemo given at this office.    Godfrey Pick, MD

## 2015-05-08 NOTE — Telephone Encounter (Signed)
Called patient about missed Neulasta injection appointment.  States that she is going to [facility] to get treatment there.  Explained to her that she had gotten treatment and was scheduled for injection today.

## 2015-05-09 ENCOUNTER — Telehealth: Payer: Self-pay

## 2015-05-09 ENCOUNTER — Encounter: Payer: Self-pay | Admitting: Oncology

## 2015-05-09 NOTE — Telephone Encounter (Signed)
-----   Message from Gordy Levan, MD sent at 05/08/2015  5:44 PM EDT ----- See my phone note 05-08-15.  RN please check with patient in next 1-2 days to be sure she got in touch with [facility] re gCSF. If that physician is aware, fine for them to take care of this. If for some reason patient needs Neulasta given here, that is fine also.  Please ask how she is after having had chemo here on 05-04-15. OK to cancel appointments at this office after you speak with her if she is transferring care.  Thank you!

## 2015-05-09 NOTE — Progress Notes (Unsigned)
Medical Oncology  My phone note 05-08-15 with concerns re gCSF routed to [facility] physician now, as patient has transferred care per RN note today.  Godfrey Pick, MD

## 2015-05-09 NOTE — Telephone Encounter (Signed)
[name] states that she is doing well after her treatment D1 Cycle 2 on 05-04-15. She stated that Dr. Lavone Neri at [facility] said that they do not give the  neulasta injection unless it is needed. Told [name] that her [place] was 1.0 at her nadir with the first cycle.  If she has a shaking chill, take her temp and or if temp is  100.5 or greater prior to follow  up visit at [facility] on 05-26-15 she needs to call the clinic.  [name] verbalized understanding. She agreed to having  Aal future appointments at [facility] canceled.  Appointments canceled.

## 2015-05-11 ENCOUNTER — Ambulatory Visit: Payer: Medicare Other

## 2015-05-11 ENCOUNTER — Ambulatory Visit: Payer: Medicare Other | Admitting: Oncology

## 2015-05-11 ENCOUNTER — Other Ambulatory Visit: Payer: Medicare Other

## 2015-05-12 ENCOUNTER — Ambulatory Visit: Payer: Medicare Other

## 2015-05-13 ENCOUNTER — Ambulatory Visit: Payer: Medicare Other

## 2015-05-15 ENCOUNTER — Other Ambulatory Visit: Payer: Self-pay | Admitting: Family Medicine

## 2015-05-18 ENCOUNTER — Encounter: Payer: Medicare Other | Admitting: Genetic Counselor

## 2015-05-18 ENCOUNTER — Other Ambulatory Visit: Payer: Medicare Other

## 2015-05-18 ENCOUNTER — Ambulatory Visit: Payer: Medicare Other | Admitting: Oncology

## 2015-06-14 ENCOUNTER — Other Ambulatory Visit: Payer: Self-pay | Admitting: Family Medicine

## 2015-06-14 NOTE — Telephone Encounter (Signed)
Last office visit 03/27/2015.  Last office visit that addressed depression????  Ok to refill?

## 2015-06-15 ENCOUNTER — Telehealth: Payer: Self-pay | Admitting: Family Medicine

## 2015-06-15 NOTE — Telephone Encounter (Signed)
Pt brought in a form to be filled out for a disability parking placard. The best number to contact pt is [phone]. Placing ppw on Dr. Hulen Shouts inbox.

## 2015-06-20 NOTE — Telephone Encounter (Signed)
Lm on pts vm and informed her paperwork is available for pickup at the front desk

## 2015-07-09 ENCOUNTER — Other Ambulatory Visit: Payer: Self-pay | Admitting: Family Medicine

## 2015-07-10 NOTE — Telephone Encounter (Signed)
Lm on pts vm informing her labs needed. Advised #30 sent until appt can be scheduled.

## 2015-07-10 NOTE — Telephone Encounter (Signed)
Last a1c 12/2014

## 2015-07-10 NOTE — Telephone Encounter (Signed)
Ok to refill but she is now due for an a1c

## 2015-07-10 NOTE — Telephone Encounter (Signed)
pts last 3 a1c abnormal

## 2015-07-10 NOTE — Telephone Encounter (Signed)
See my note below.  She is due for a1c but 6.6 is not abnormal for a diabetic.  She is well controlled.

## 2015-07-12 ENCOUNTER — Ambulatory Visit: Payer: Medicare Other | Admitting: Family Medicine

## 2015-07-17 ENCOUNTER — Ambulatory Visit (INDEPENDENT_AMBULATORY_CARE_PROVIDER_SITE_OTHER): Payer: Medicare Other | Admitting: Family Medicine

## 2015-07-17 VITALS — BP 100/46 | HR 82 | Temp 98.0°F | Wt 188.2 lb

## 2015-07-17 DIAGNOSIS — Z742 Need for assistance at home and no other household member able to render care: Secondary | ICD-10-CM | POA: Insufficient documentation

## 2015-07-17 DIAGNOSIS — C801 Malignant (primary) neoplasm, unspecified: Secondary | ICD-10-CM

## 2015-07-17 DIAGNOSIS — C569 Malignant neoplasm of unspecified ovary: Secondary | ICD-10-CM | POA: Diagnosis not present

## 2015-07-17 DIAGNOSIS — C786 Secondary malignant neoplasm of retroperitoneum and peritoneum: Secondary | ICD-10-CM

## 2015-07-17 NOTE — Progress Notes (Signed)
Pre visit review using our clinic review tool, if applicable. No additional management support is needed unless otherwise documented below in the visit note. 

## 2015-07-17 NOTE — Progress Notes (Signed)
Subjective:   Patient ID: [name], female    DOB: 11-22-1950, 64 y.o.   MRN: [id number]  [name] is a pleasant 64 y.o. year old female who presents to clinic today with home health  on 07/17/2015  HPI:  Advanced(stage VI) GYN carcinoma- being treated with chemotherapy along with frequent paracentesis and thoracentesis. Was being seen by Dr. Marko Plume here but transferred care to [facility]- Notes reviewed in Care Everywhere- was last seen by oncologist, Dr. Fransisca Connors, on 06/23/15. Last chemotherapy this past Friday.  Very tired, nauseated.  Does have rx to help with nausea.  She is still having abdominal pain.  11/18 is next chemotherapy tx- this will be her 6th.  Fatigue and decreased appetite is progressing.  Cannot bend due to abdominal pain and SOB (tumor growth on diaphragm).  Needs "help around the house."  Can no longer perform routine house hold tasks.  Husband is able to do what he can.  He prepares his meals and medications.  Wt Readings from Last 3 Encounters:  07/17/15 188 lb 4 oz (85.39 kg)  05/01/15 206 lb (93.441 kg)  04/24/15 209 lb 8 oz (95.029 kg)    Current Outpatient Prescriptions on File Prior to Visit  Medication Sig Dispense Refill  . albuterol (PROVENTIL HFA;VENTOLIN HFA) 108 (90 BASE) MCG/ACT inhaler Inhale 2 puffs into the lungs every 6 (six) hours as needed for wheezing or shortness of breath. 1 Inhaler 0  . busPIRone (BUSPAR) 10 MG tablet Take 1 tablet (10 mg total) by mouth at bedtime. 90 tablet 3  . cycloSPORINE (RESTASIS) 0.05 % ophthalmic emulsion Place 1 drop into both eyes 2 (two) times daily.    Marland Kitchen dexamethasone (DECADRON) 4 MG tablet Take 5 tablets with food 12 hrs and 6 hrs prior to Taxol Chemotherapy 20 tablet 1  . ferrous fumarate (HEMOCYTE - 106 MG FE) 325 (106 FE) MG TABS tablet Take 1 tablet on an empty stomach with OJ or Vitamin C tablet 30 each 0  . FLUoxetine (PROZAC) 20 MG capsule TAKE THREE CAPSULES BY MOUTH ONCE DAILY 90  capsule 0  . glucose blood (ONE TOUCH ULTRA TEST) test strip Check blood sugar four times a day. Dx 250.70 (Patient taking differently: 1 each by Other route 2 (two) times daily. Check blood sugar in the am . Dx 250.70) 100 each 5  . glyBURIDE (DIABETA) 5 MG tablet TAKE ONE TABLET BY MOUTH TWICE DAILY WITH  A  MEAL 60 tablet 0  . HYDROcodone-acetaminophen (NORCO/VICODIN) 5-325 MG per tablet Take 1 tablet by mouth every 6 (six) hours as needed. 30 tablet 0  . ibuprofen (ADVIL,MOTRIN) 200 MG tablet Take 200 mg by mouth every 6 (six) hours as needed for headache.    . insulin glargine (LANTUS) 100 UNIT/ML injection Inject 15 Units into the skin at bedtime.    Marland Kitchen lisinopril (PRINIVIL,ZESTRIL) 10 MG tablet Take 1 tablet (10 mg total) by mouth daily. 30 tablet 6  . LORazepam (ATIVAN) 0.5 MG tablet Place 1 tablet under the tongue to dissolve or swallow every 6 hrs as needed for nausea. Will make you Drowsy 20 tablet 1  . lovastatin (MEVACOR) 20 MG tablet Take 1 tablet (20 mg total) by mouth at bedtime. 90 tablet 3  . metFORMIN (GLUCOPHAGE) 500 MG tablet Take 1 tablet (500 mg total) by mouth 2 (two) times daily with a meal. 180 tablet 3  . ondansetron (ZOFRAN) 8 MG tablet Take 1 tablet (8 mg total) by mouth  every 8 (eight) hours as needed for nausea or vomiting. Will not make drowsy. 30 tablet 1  . ranitidine (ZANTAC) 150 MG tablet Take 1 tablet (150 mg total) by mouth 2 (two) times daily. 180 tablet 2  . zoster vaccine live, PF, (ZOSTAVAX) 80034 UNT/0.65ML injection Inject 19,400 Units into the skin once. 1 each 0   No current facility-administered medications on file prior to visit.    Allergies  Allergen Reactions  . Aspirin Other (See Comments)    asthma  . Codeine Nausea And Vomiting    Past Medical History  Diagnosis Date  . Chronic diastolic heart failure   . Congestive heart failure, unspecified   . Other dyspnea and respiratory abnormality   . Essential hypertension, benign   . Other  and unspecified hyperlipidemia   . Type II or unspecified type diabetes mellitus with peripheral circulatory disorders, not stated as uncontrolled(250.70)   . Anxiety state, unspecified   . Depressive disorder, not elsewhere classified   . Unspecified asthma, with exacerbation   . Recent retinal detachment, partial, with giant tear   . Anemia, unspecified   . Wheezing   . Acute upper respiratory infections of unspecified site   . Contact dermatitis and other eczema, due to unspecified cause   . Other diseases of lung, not elsewhere classified   . Unspecified urinary incontinence   . Cellulitis   . Mastoiditis   . Diabetes mellitus     Past Surgical History  Procedure Laterality Date  . Knee surgery  2003    meniscus  . Eye surgery      Left retina repair- 11/09, 02-20-09  . Laparoscopic gastric banding  08/21/10  . Abdominal hysterectomy    . Dilation and curettage of uterus      Family History  Problem Relation Age of Onset  . Cancer Mother     breast  . Cancer Sister     breast  . Cancer Maternal Aunt     breast  . Cancer Maternal Uncle     breast  . Cancer Maternal Grandmother     breast  . Cancer Maternal Uncle     Social History   Social History  . Marital Status: Married    Spouse Name: N/A  . Number of Children: N/A  . Years of Education: N/A   Occupational History  . School bus driver    Social History Main Topics  . Smoking status: Never Smoker   . Smokeless tobacco: Never Used  . Alcohol Use: No  . Drug Use: No  . Sexual Activity: Not on file   Other Topics Concern  . Not on file   Social History Narrative   The PMH, PSH, Social History, Family History, Medications, and allergies have been reviewed in [facility], and have been updated if relevant.    Review of Systems  Constitutional: Positive for fatigue and unexpected weight change. Negative for fever.  HENT: Negative.   Eyes: Negative.   Respiratory: Positive for shortness of breath.     Cardiovascular: Positive for chest pain.  Gastrointestinal: Positive for nausea, vomiting, abdominal pain and abdominal distention. Negative for diarrhea, constipation, blood in stool, anal bleeding and rectal pain.  Neurological: Positive for dizziness.  Psychiatric/Behavioral: Negative.   All other systems reviewed and are negative.      Objective:    BP 100/46 mmHg  Pulse 82  Temp(Src) 98 F (36.7 C) (Oral)  Wt 188 lb 4 oz (85.39 kg)  SpO2 95%  Physical Exam  Constitutional: She is oriented to person, place, and time.  Appears pale, has lost her hair Looks fatigued, walking with slow, wider based gait  HENT:  Head: Normocephalic.  Eyes: Conjunctivae are normal.  Cardiovascular: Normal rate.   Pulmonary/Chest: Effort normal.  Abdominal: She exhibits distension.  Neurological: She is alert and oriented to person, place, and time. No cranial nerve deficit.  Psychiatric: She has a normal mood and affect. Her behavior is normal. Judgment and thought content normal.          Assessment & Plan:   Peritoneal carcinomatosis ([place])  Ovarian cancer, unspecified laterality ([place]) No Follow-up on file.

## 2015-07-17 NOTE — Assessment & Plan Note (Signed)
>  25 minutes spent in face to face time with patient, >50% spent in counselling or coordination of care Order placed for home health aid. Does not need nursing and declines PT at this point.

## 2015-07-17 NOTE — Patient Instructions (Signed)
Great to see you. Please stop by to see [name] on your way out.   

## 2015-07-22 ENCOUNTER — Other Ambulatory Visit: Payer: Self-pay | Admitting: Family Medicine

## 2015-08-21 ENCOUNTER — Other Ambulatory Visit: Payer: Self-pay | Admitting: Family Medicine

## 2015-08-21 NOTE — Telephone Encounter (Signed)
Last A1C slightly abnormal. pls advise

## 2015-09-12 ENCOUNTER — Other Ambulatory Visit: Payer: Self-pay | Admitting: Nurse Practitioner

## 2015-09-25 ENCOUNTER — Other Ambulatory Visit: Payer: Self-pay | Admitting: Family Medicine

## 2015-09-26 ENCOUNTER — Other Ambulatory Visit: Payer: Self-pay | Admitting: Family Medicine

## 2015-10-12 ENCOUNTER — Other Ambulatory Visit: Payer: Self-pay | Admitting: Family Medicine

## 2015-10-12 ENCOUNTER — Telehealth: Payer: Self-pay | Admitting: Family Medicine

## 2015-10-12 ENCOUNTER — Other Ambulatory Visit (INDEPENDENT_AMBULATORY_CARE_PROVIDER_SITE_OTHER): Payer: Medicare Other

## 2015-10-12 DIAGNOSIS — E08311 Diabetes mellitus due to underlying condition with unspecified diabetic retinopathy with macular edema: Secondary | ICD-10-CM

## 2015-10-12 DIAGNOSIS — E785 Hyperlipidemia, unspecified: Secondary | ICD-10-CM | POA: Diagnosis not present

## 2015-10-12 DIAGNOSIS — E083513 Diabetes mellitus due to underlying condition with proliferative diabetic retinopathy with macular edema, bilateral: Secondary | ICD-10-CM | POA: Diagnosis not present

## 2015-10-12 LAB — LIPID PANEL
CHOLESTEROL: 149 mg/dL (ref 0–200)
HDL: 34.3 mg/dL — AB (ref >39.00)
NonHDL: 114.23
TRIGLYCERIDES: 205 mg/dL — AB (ref 0.0–149.0)
Total CHOL/HDL Ratio: 4
VLDL: 41 mg/dL — ABNORMAL HIGH (ref 0.0–40.0)

## 2015-10-12 LAB — COMPREHENSIVE METABOLIC PANEL
ALBUMIN: 4 g/dL (ref 3.5–5.2)
ALK PHOS: 57 U/L (ref 39–117)
ALT: 22 U/L (ref 0–35)
AST: 17 U/L (ref 0–37)
BILIRUBIN TOTAL: 0.5 mg/dL (ref 0.2–1.2)
BUN: 29 mg/dL — ABNORMAL HIGH (ref 6–23)
CALCIUM: 9 mg/dL (ref 8.4–10.5)
CO2: 24 mEq/L (ref 19–32)
CREATININE: 0.85 mg/dL (ref 0.40–1.20)
Chloride: 103 mEq/L (ref 96–112)
GFR: 71.04 mL/min (ref >60.00)
Glucose, Bld: 90 mg/dL (ref 70–99)
Potassium: 4.6 mEq/L (ref 3.5–5.1)
Sodium: 133 mEq/L — ABNORMAL LOW (ref 135–145)
Total Protein: 7.4 g/dL (ref 6.0–8.3)

## 2015-10-12 LAB — LDL CHOLESTEROL, DIRECT: Direct LDL: 84 mg/dL

## 2015-10-12 LAB — HEMOGLOBIN A1C: Hgb A1c MFr Bld: 5.5 % (ref 4.6–6.5)

## 2015-10-12 NOTE — Telephone Encounter (Signed)
Labs were collected on today and have not been resulted. If normal, a letter will be mailed

## 2015-10-12 NOTE — Telephone Encounter (Signed)
Please mail copy of labs to pt

## 2015-10-13 ENCOUNTER — Other Ambulatory Visit: Payer: Self-pay | Admitting: Family Medicine

## 2015-10-24 ENCOUNTER — Other Ambulatory Visit: Payer: Self-pay | Admitting: Family Medicine

## 2015-11-25 ENCOUNTER — Other Ambulatory Visit: Payer: Self-pay | Admitting: Family Medicine

## 2015-11-27 ENCOUNTER — Ambulatory Visit (INDEPENDENT_AMBULATORY_CARE_PROVIDER_SITE_OTHER): Payer: Medicare Other | Admitting: Family Medicine

## 2015-11-27 ENCOUNTER — Ambulatory Visit (INDEPENDENT_AMBULATORY_CARE_PROVIDER_SITE_OTHER)
Admission: RE | Admit: 2015-11-27 | Discharge: 2015-11-27 | Disposition: A | Discharge status: Home / Self Care | Payer: Medicare Other | Source: Ambulatory Visit | Attending: Family Medicine | Admitting: Family Medicine

## 2015-11-27 ENCOUNTER — Encounter: Payer: Self-pay | Admitting: Family Medicine

## 2015-11-27 ENCOUNTER — Telehealth: Payer: Self-pay | Admitting: Family Medicine

## 2015-11-27 VITALS — BP 106/52 | HR 98 | Temp 97.9°F | Wt 206.5 lb

## 2015-11-27 DIAGNOSIS — R059 Cough, unspecified: Secondary | ICD-10-CM | POA: Insufficient documentation

## 2015-11-27 DIAGNOSIS — R530 Neoplastic (malignant) related fatigue: Secondary | ICD-10-CM

## 2015-11-27 DIAGNOSIS — R05 Cough: Secondary | ICD-10-CM | POA: Insufficient documentation

## 2015-11-27 DIAGNOSIS — R5383 Other fatigue: Secondary | ICD-10-CM | POA: Insufficient documentation

## 2015-11-27 LAB — CBC WITH DIFFERENTIAL/PLATELET
BASOS ABS: 0 10*3/uL (ref 0.0–0.1)
Basophils Relative: 0.6 % (ref 0.0–3.0)
EOS ABS: 0 10*3/uL (ref 0.0–0.7)
Eosinophils Relative: 0.1 % (ref 0.0–5.0)
Hemoglobin: 8.2 g/dL — ABNORMAL LOW (ref 12.0–15.0)
LYMPHS PCT: 16.1 % (ref 12.0–46.0)
Lymphs Abs: 0.4 10*3/uL — ABNORMAL LOW (ref 0.7–4.0)
MCHC: 34.1 g/dL (ref 30.0–36.0)
MCV: 91.9 fl (ref 78.0–100.0)
Monocytes Absolute: 0.3 10*3/uL (ref 0.1–1.0)
Monocytes Relative: 11.1 % (ref 3.0–12.0)
NEUTROS ABS: 1.7 10*3/uL (ref 1.4–7.7)
Neutrophils Relative %: 72.1 % (ref 43.0–77.0)
PLATELETS: 269 10*3/uL (ref 150.0–400.0)
RBC: 2.6 Mil/uL — ABNORMAL LOW (ref 3.87–5.11)
RDW: 18.2 % — ABNORMAL HIGH (ref 11.5–15.5)
WBC: 2.4 10*3/uL — AB (ref 4.0–10.5)

## 2015-11-27 MED ORDER — AZITHROMYCIN 250 MG PO TABS: ORAL_TABLET | ORAL | Status: DC

## 2015-11-27 NOTE — Telephone Encounter (Signed)
Last f/u 12/2014.  pls advise

## 2015-11-27 NOTE — Patient Instructions (Signed)
Good to see you. Please take zpack as directed. I will call you with your results from today.

## 2015-11-27 NOTE — Assessment & Plan Note (Signed)
With fatigue and wheezes on exam in an immunocompromised pt. She is on Keflex. Will add Zpack, CXR to rule out infection and effusions. CBC given fatigue and h/o blood transfusions. The patient indicates understanding of these issues and agrees with the plan. Orders Placed This Encounter  Procedures  . DG Chest 2 View  . CBC with Differential/Platelet

## 2015-11-27 NOTE — Telephone Encounter (Signed)
See results note. 

## 2015-11-27 NOTE — Progress Notes (Signed)
Pre visit review using our clinic review tool, if applicable. No additional management support is needed unless otherwise documented below in the visit note. 

## 2015-11-27 NOTE — Progress Notes (Signed)
Subjective:   Patient ID: [name], female    DOB: [birth year], 65 y.o.   MRN: [id number]  [name] is a pleasant 65 y.o. year old female who presents to clinic today with Cough and Fatigue  on 11/27/2015  HPI:  Being treated for stage 4 ovarian cancer at [facility].  Last chemo was 4 weeks ago. CA125 is now 14 which is great news.  She has been more fatigued this past week.  Now has dry cough and drainage.  Has had anemia requiring 3 blood transfusions, most recently 2 months ago.  Lab Results  Component Value Date   WBC 11.2* 05/04/2015   HGB 10.1* 05/04/2015   HCT 32.4* 05/04/2015   MCV 74.4* 05/04/2015   PLT 350 05/04/2015   No fever with cough.  Has been taking Keflex for past 3 days for wound infection.  +SOB. No sinus pressure.  Pneumococcal vaccines UTD.  Current Outpatient Prescriptions on File Prior to Visit  Medication Sig Dispense Refill  . albuterol (PROVENTIL HFA;VENTOLIN HFA) 108 (90 BASE) MCG/ACT inhaler Inhale 2 puffs into the lungs every 6 (six) hours as needed for wheezing or shortness of breath. 1 Inhaler 0  . busPIRone (BUSPAR) 10 MG tablet Take 1 tablet (10 mg total) by mouth at bedtime. 90 tablet 3  . cycloSPORINE (RESTASIS) 0.05 % ophthalmic emulsion Place 1 drop into both eyes 2 (two) times daily.    Marland Kitchen dexamethasone (DECADRON) 4 MG tablet Take 5 tablets with food 12 hrs and 6 hrs prior to Taxol Chemotherapy 20 tablet 1  . ferrous fumarate (HEMOCYTE - 106 MG FE) 325 (106 FE) MG TABS tablet Take 1 tablet on an empty stomach with OJ or Vitamin C tablet 30 each 0  . FLUoxetine (PROZAC) 20 MG capsule TAKE THREE CAPSULES BY MOUTH ONCE DAILY 90 capsule 0  . glucose blood (ONE TOUCH ULTRA TEST) test strip Check blood sugar four times a day. Dx 250.70 (Patient taking differently: 1 each by Other route 2 (two) times daily. Check blood sugar in the am . Dx 250.70) 100 each 5  . HYDROcodone-acetaminophen (NORCO/VICODIN) 5-325 MG per tablet Take 1  tablet by mouth every 6 (six) hours as needed. 30 tablet 0  . ibuprofen (ADVIL,MOTRIN) 200 MG tablet Take 200 mg by mouth every 6 (six) hours as needed for headache.    . lisinopril (PRINIVIL,ZESTRIL) 10 MG tablet TAKE ONE TABLET BY MOUTH ONCE DAILY 30 tablet 0  . LORazepam (ATIVAN) 0.5 MG tablet Place 1 tablet under the tongue to dissolve or swallow every 6 hrs as needed for nausea. Will make you Drowsy 20 tablet 1  . lovastatin (MEVACOR) 20 MG tablet Take 1 tablet (20 mg total) by mouth at bedtime. 90 tablet 3  . metFORMIN (GLUCOPHAGE) 500 MG tablet Take 1 tablet (500 mg total) by mouth 2 (two) times daily with a meal. 180 tablet 3  . ondansetron (ZOFRAN) 8 MG tablet Take 1 tablet (8 mg total) by mouth every 8 (eight) hours as needed for nausea or vomiting. Will not make drowsy. 30 tablet 1  . ranitidine (ZANTAC) 150 MG tablet TAKE ONE TABLET BY MOUTH TWICE DAILY 180 tablet 0  . zoster vaccine live, PF, (ZOSTAVAX) 60454 UNT/0.65ML injection Inject 19,400 Units into the skin once. 1 each 0   No current facility-administered medications on file prior to visit.    Allergies  Allergen Reactions  . Aspirin Other (See Comments)    asthma  . Codeine Nausea  And Vomiting    Past Medical History  Diagnosis Date  . Chronic diastolic heart failure ([place])   . Congestive heart failure, unspecified   . Other dyspnea and respiratory abnormality   . Essential hypertension, benign   . Other and unspecified hyperlipidemia   . Type II or unspecified type diabetes mellitus with peripheral circulatory disorders, not stated as uncontrolled(250.70)   . Anxiety state, unspecified   . Depressive disorder, not elsewhere classified   . Unspecified asthma, with exacerbation   . Recent retinal detachment, partial, with giant tear   . Anemia, unspecified   . Wheezing   . Acute upper respiratory infections of unspecified site   . Contact dermatitis and other eczema, due to unspecified cause   . Other diseases  of lung, not elsewhere classified   . Unspecified urinary incontinence   . Cellulitis   . Mastoiditis   . Diabetes mellitus     Past Surgical History  Procedure Laterality Date  . Knee surgery  2003    meniscus  . Eye surgery      Left retina repair- 11/09, 02-20-09  . Laparoscopic gastric banding  08/21/10  . Abdominal hysterectomy    . Dilation and curettage of uterus      Family History  Problem Relation Age of Onset  . Cancer Mother     breast  . Cancer Sister     breast  . Cancer Maternal Aunt     breast  . Cancer Maternal Uncle     breast  . Cancer Maternal Grandmother     breast  . Cancer Maternal Uncle     Social History   Social History  . Marital Status: Married    Spouse Name: N/A  . Number of Children: N/A  . Years of Education: N/A   Occupational History  . School bus driver    Social History Main Topics  . Smoking status: Never Smoker   . Smokeless tobacco: Never Used  . Alcohol Use: No  . Drug Use: No  . Sexual Activity: Not on file   Other Topics Concern  . Not on file   Social History Narrative      Review of Systems  Constitutional: Positive for fatigue.  HENT: Positive for congestion and rhinorrhea. Negative for sinus pressure.   Respiratory: Positive for cough and shortness of breath. Negative for wheezing and stridor.   Musculoskeletal: Negative.   Skin: Negative.        Objective:    BP 106/52 mmHg  Pulse 98  Temp(Src) 97.9 F (36.6 C) (Oral)  Wt 206 lb 8 oz (93.668 kg)  SpO2 97%   Physical Exam  Constitutional: She is oriented to person, place, and time. She appears well-developed and well-nourished. No distress.  HENT:  Head: Normocephalic.  Eyes: Conjunctivae are normal.  Cardiovascular: Normal rate and regular rhythm.   Pulmonary/Chest: Effort normal. No respiratory distress. She has wheezes.  Neurological: She is alert and oriented to person, place, and time. No cranial nerve deficit.  Skin: Skin is warm and  dry. She is not diaphoretic.  Psychiatric: She has a normal mood and affect. Her behavior is normal. Judgment and thought content normal.  Nursing note and vitals reviewed.         Assessment & Plan:   Cough  Neoplastic malignant related fatigue No Follow-up on file.

## 2015-11-27 NOTE — Telephone Encounter (Signed)
Patient called to get the results of her lab work.

## 2015-11-28 ENCOUNTER — Telehealth: Payer: Self-pay | Admitting: Family Medicine

## 2015-11-28 NOTE — Telephone Encounter (Signed)
Patient would like the results of the labs she took on 11/27/15.

## 2015-11-29 NOTE — Telephone Encounter (Signed)
See results note. 

## 2015-12-24 ENCOUNTER — Other Ambulatory Visit: Payer: Self-pay | Admitting: Family Medicine

## 2015-12-27 ENCOUNTER — Other Ambulatory Visit: Payer: Self-pay | Admitting: Family Medicine

## 2015-12-29 ENCOUNTER — Other Ambulatory Visit: Payer: Self-pay | Admitting: Family Medicine

## 2016-01-04 ENCOUNTER — Other Ambulatory Visit: Payer: Self-pay | Admitting: Family Medicine

## 2016-01-08 ENCOUNTER — Ambulatory Visit (INDEPENDENT_AMBULATORY_CARE_PROVIDER_SITE_OTHER): Payer: Medicare Other | Admitting: Family Medicine

## 2016-01-08 ENCOUNTER — Encounter: Payer: Self-pay | Admitting: Family Medicine

## 2016-01-08 VITALS — BP 100/60 | HR 72 | Temp 98.2°F | Wt 214.0 lb

## 2016-01-08 DIAGNOSIS — R51 Headache: Secondary | ICD-10-CM

## 2016-01-08 DIAGNOSIS — C569 Malignant neoplasm of unspecified ovary: Secondary | ICD-10-CM

## 2016-01-08 DIAGNOSIS — R519 Headache, unspecified: Secondary | ICD-10-CM | POA: Insufficient documentation

## 2016-01-08 MED ORDER — GABAPENTIN 100 MG PO CAPS: 100.0000 mg | ORAL_CAPSULE | Freq: Three times a day (TID) | ORAL | Status: DC

## 2016-01-08 MED ORDER — DEXAMETHASONE SODIUM PHOSPHATE 10 MG/ML IJ SOLN
10.0000 mg | Freq: Once | INTRAMUSCULAR | Status: AC
  Administered 2016-01-08: 10 mg via INTRAMUSCULAR

## 2016-01-08 NOTE — Progress Notes (Signed)
Pre visit review using our clinic review tool, if applicable. No additional management support is needed unless otherwise documented below in the visit note. 

## 2016-01-08 NOTE — Assessment & Plan Note (Signed)
New- reassuring by negative imaging. ?occipital neuralgia. IM decadron given in office for inflammation. Course of gabapentin- 100 mg three times daily for no more than 10 days without updating me. Call or return to clinic prn if these symptoms worsen or fail to improve as anticipated. The patient indicates understanding of these issues and agrees with the plan.

## 2016-01-08 NOTE — Progress Notes (Signed)
Subjective:   Patient ID: [name], female    DOB: 11-Jun-1951, 65 y.o.   MRN: [id number]  [name] is a pleasant 65 y.o. year old female pt with stage four ovarian CA, CAD, L central retinal artery occlusion, who presents to clinic today with Headache ER follow up on 01/08/2016  HPI:  Martin Majestic to [facility] on 01/06/16 for headache.   Note reviewed.  Headache and neck pain for weeks.  Left sided, peri- tracheal and occurs only with swallowing.  Labs and imaging unremarkable and advised like ? Migraine.  Pain is shooting- left sided of neck and temporal area.  CT head and neck angiogram with and without contrast 01/06/2016  [place]  Result Narrative  ** CTA HEAD WITH CONTRAST ** ** CTA NECK WITH CONTRAST **  INDICATION: left sided neck pain and headache, R51 Headache provided.      COMPARISONS: None.  TECHNIQUE/PROTOCOL: Contrast enhanced head and neck CT arteriogram was performed. Scanning performed during the arterial phase from the thoracic inlet to the circle of Willis. 3D reconstructions were performed to evaluate vascular anatomy. All carotid measurements were made according to the NASCET criteria.    CONTRAST: 93mL [id number]-370 IV administered via power injector at an injection rate of 4 mL/sec.  IV contrast was administered to improve disease detection and further define anatomy. *    Creatinine = 1.0 on 123456 *    Complications = No immediate patient complications or events noted.  FINDINGS: --BRAIN FINDINGS:     No hemorrhage, extra-axial fluid collection, cerebral edema, acute cortical infarction, mass, mass effect, midline shift. Ventricles and sulci are normal for patient age. Normal paranasal sinuses, orbits and cranium. OS retinal banding. --NECK FINDINGS:     No enlarged or abnormal appearing lymph nodes. The nasopharynx, oropharynx, hypopharynx, and larynx are normal. Normal parotid and submandibular glands. Normal thyroid.  Cervical spine is normal.  --VASCULATURE FINDINGS:  No evidence of stenosis or aneurysm. Arch & Subclavian Arteries: Left vertebral artery originates from aortic arch. Subclavian arteries are normal bilaterally. ICAs: Patent bilaterally form the skull base to the carotid terminus. MCAs: Normal bilaterally. ACAs:  Normal bilaterally. P-Comms: Visualized bilaterally.. Vertebral arteries: Normal to the confluence with the basilar artery.  Right dominant vertebral system. Basilar artery: Normal. PCAs: Normal bilaterally. SCAs: Normal bilaterally. AICAs: Normal bilaterally. PICAs: Normal bilaterally.  IMPRESSION: Intracranial and extracranial carotid and vertebral arterial vasculature are normal.      The preliminary report (critical or emergent communication) was reviewed prior to this dictation and there are no substantial differences between the preliminary results and the impressions in this final report.        Electronically Reviewed by:  Gary Fleet, PhD, MD Electronically Reviewed on:  01/07/2016 9:24 AM  I have reviewed the images and concur with the above findings.  Electronically Signed by:  Natale Milch, MD Electronically Signed on:  01/07/2016 2:35 PM   Last chemo was last month and was told she is "cancer free."  Current Outpatient Prescriptions on File Prior to Visit  Medication Sig Dispense Refill  . albuterol (PROVENTIL HFA;VENTOLIN HFA) 108 (90 BASE) MCG/ACT inhaler Inhale 2 puffs into the lungs every 6 (six) hours as needed for wheezing or shortness of breath. 1 Inhaler 0  . azithromycin (ZITHROMAX) 250 MG tablet 2 tabs by mouth on day 1 followed 1 tab by mouth daily days 2-5 6 tablet 0  . busPIRone (BUSPAR) 10 MG tablet Take 1 tablet (10 mg  total) by mouth at bedtime. 90 tablet 3  . cycloSPORINE (RESTASIS) 0.05 % ophthalmic emulsion Place 1 drop into both eyes 2 (two) times daily.    Marland Kitchen dexamethasone (DECADRON) 4 MG tablet Take 5 tablets with food 12 hrs and  6 hrs prior to Taxol Chemotherapy 20 tablet 1  . ferrous fumarate (HEMOCYTE - 106 MG FE) 325 (106 FE) MG TABS tablet Take 1 tablet on an empty stomach with OJ or Vitamin C tablet 30 each 0  . FLUoxetine (PROZAC) 20 MG capsule  TAKE THREE CAPSULES BY MOUTH ONCE DAILY. 90 capsule 0  . glucose blood (ONE TOUCH ULTRA TEST) test strip Check blood sugar four times a day. Dx 250.70 (Patient taking differently: 1 each by Other route 2 (two) times daily. Check blood sugar in the am . Dx 250.70) 100 each 5  . HYDROcodone-acetaminophen (NORCO/VICODIN) 5-325 MG per tablet Take 1 tablet by mouth every 6 (six) hours as needed. 30 tablet 0  . ibuprofen (ADVIL,MOTRIN) 200 MG tablet Take 200 mg by mouth every 6 (six) hours as needed for headache.    . lisinopril (PRINIVIL,ZESTRIL) 10 MG tablet TAKE ONE TABLET BY MOUTH ONCE DAILY 30 tablet 0  . lovastatin (MEVACOR) 20 MG tablet Take 1 tablet (20 mg total) by mouth at bedtime. 90 tablet 3  . metFORMIN (GLUCOPHAGE) 500 MG tablet Take 1 tablet (500 mg total) by mouth 2 (two) times daily with a meal. 180 tablet 3  . ondansetron (ZOFRAN) 8 MG tablet Take 1 tablet (8 mg total) by mouth every 8 (eight) hours as needed for nausea or vomiting. Will not make drowsy. 30 tablet 1  . ranitidine (ZANTAC) 150 MG tablet TAKE ONE TABLET BY MOUTH TWICE DAILY 180 tablet 2  . zoster vaccine live, PF, (ZOSTAVAX) 09811 UNT/0.65ML injection Inject 19,400 Units into the skin once. 1 each 0   No current facility-administered medications on file prior to visit.    Allergies  Allergen Reactions  . Aspirin Other (See Comments)    asthma  . Lorazepam Shortness Of Breath  . Codeine Nausea And Vomiting    Past Medical History  Diagnosis Date  . Chronic diastolic heart failure ([place])   . Congestive heart failure, unspecified   . Other dyspnea and respiratory abnormality   . Essential hypertension, benign   . Other and unspecified hyperlipidemia   . Type II or unspecified type diabetes  mellitus with peripheral circulatory disorders, not stated as uncontrolled(250.70)   . Anxiety state, unspecified   . Depressive disorder, not elsewhere classified   . Unspecified asthma, with exacerbation   . Recent retinal detachment, partial, with giant tear   . Anemia, unspecified   . Wheezing   . Acute upper respiratory infections of unspecified site   . Contact dermatitis and other eczema, due to unspecified cause   . Other diseases of lung, not elsewhere classified   . Unspecified urinary incontinence   . Cellulitis   . Mastoiditis   . Diabetes mellitus     Past Surgical History  Procedure Laterality Date  . Knee surgery  2003    meniscus  . Eye surgery      Left retina repair- 11/09, 02-20-09  . Laparoscopic gastric banding  08/21/10  . Abdominal hysterectomy    . Dilation and curettage of uterus      Family History  Problem Relation Age of Onset  . Cancer Mother     breast  . Cancer Sister     breast  .  Cancer Maternal Aunt     breast  . Cancer Maternal Uncle     breast  . Cancer Maternal Grandmother     breast  . Cancer Maternal Uncle     Social History   Social History  . Marital Status: Married    Spouse Name: N/A  . Number of Children: N/A  . Years of Education: N/A   Occupational History  . School bus driver    Social History Main Topics  . Smoking status: Never Smoker   . Smokeless tobacco: Never Used  . Alcohol Use: No  . Drug Use: No  . Sexual Activity: Not on file   Other Topics Concern  . Not on file   Social History Narrative   The PMH, PSH, Social History, Family History, Medications, and allergies have been reviewed in [facility], and have been updated if relevant.   Review of Systems  Musculoskeletal: Negative.   Skin: Negative.   Neurological: Positive for headaches. Negative for tremors, syncope, speech difficulty, weakness, light-headedness and numbness.  All other systems reviewed and are negative.      Objective:    BP  100/60 mmHg  Pulse 72  Temp(Src) 98.2 F (36.8 C)  Wt 214 lb (97.07 kg)  SpO2 97%   Physical Exam  Constitutional: She is oriented to person, place, and time. She appears well-developed and well-nourished.  HENT:  Head: Normocephalic.  Eyes: Conjunctivae are normal.  Cardiovascular: Normal rate.   Pulmonary/Chest: Effort normal.  Neurological: She is alert and oriented to person, place, and time. No cranial nerve deficit.  Appears uncomfortable every few minutes with shooting pain that resolves quickly  Skin: Skin is warm and dry.  Psychiatric: She has a normal mood and affect. Her behavior is normal. Judgment and thought content normal.  Nursing note and vitals reviewed.         Assessment & Plan:   Ovarian cancer, unspecified laterality ([place])  Headache, unspecified headache type No Follow-up on file.

## 2016-01-08 NOTE — Patient Instructions (Signed)
Occipital Neuralgia Occipital neuralgia is a type of headache that causes episodes of very bad pain in the back of your head. Pain from occipital neuralgia may spread (radiate) to other parts of your head. The pain is usually brief and often goes away after you rest and relax. These headaches may be caused by irritation of the nerves that leave your spinal cord high up in your neck, just below the base of your skull (occipital nerves). Your occipital nerves transmit sensations from the back of your head, the top of your head, and the areas behind your ears. CAUSES Occipital neuralgia can occur without any known cause (primary headache syndrome). In other cases, occipital neuralgia is caused by pressure on or irritation of one of the two occipital nerves. Causes of occipital nerve compression or irritation include:  Wear and tear of the vertebrae in the neck (osteoarthritis).  Neck injury.  Disease of the disks that separate the vertebrae.  Tumors.  Gout.  Infections.  Diabetes.  Swollen blood vessels that put pressure on the occipital nerves.  Muscle spasm in the neck. SIGNS AND SYMPTOMS Pain is the main symptom of occipital neuralgia. It usually starts in the back of the head but may also be felt in other areas supplied by the occipital nerves. Pain is usually on one side but may be on both sides. You may have:   Brief episodes of very bad pain that is burning, stabbing, shocking, or shooting.  Pain behind the eye.  Pain triggered by neck movement or hair brushing.  Scalp tenderness.  Aching in the back of the head between episodes of very bad pain. DIAGNOSIS  Your health care provider may diagnose occipital neuralgia based on your symptoms and a physical exam. During the exam, the health care provider may push on areas supplied by the occipital nerves to see if they are painful. Some tests may also be done to help in making the diagnosis. These may include:  Imaging studies of  the upper spinal cord, such as an MRI or CT scan. These may show compression or spinal cord abnormalities.  Nerve block. You will get an injection of numbing medicine (local anesthetic) near the occipital nerve to see if this relieves pain. TREATMENT  Treatment may begin with simple measures, such as:   Rest.  Massage.  Heat.  Over-the-counter pain relievers. If these measures do not work, you may need other treatments, including:  Medicines such as:  Prescription-strength anti-inflammatory medicines.  Muscle relaxants.  Antiseizure medicines.  Antidepressants.  Steroid injection. This involves injections of local anesthetic and strong anti-inflammatory drugs (steroids).  Pulsed radiofrequency. Wires are implanted to deliver electrical impulses that block pain signals from the occipital nerve.  Physical therapy.  Surgery to relieve nerve pressure. HOME CARE INSTRUCTIONS  Take all medicines as directed by your health care provider.  Avoid activities that cause pain.  Rest when you have an attack of pain.  Try gentle massage or a heating pad to relieve pain.  Work with a physical therapist to learn stretching exercises you can do at home.  Try a different pillow or sleeping position.  Practice good posture.  Try to stay active. Get regular exercise that does not cause pain. Ask your health care provider to suggest safe exercises for you.  Keep all follow-up visits as directed by your health care provider. This is important. SEEK MEDICAL CARE IF:  Your medicine is not working.  You have new or worsening symptoms. SEEK IMMEDIATE MEDICAL CARE   IF:  You have very bad head pain that is not going away.  You have a sudden change in vision, balance, or speech. MAKE SURE YOU:  Understand these instructions.  Will watch your condition.  Will get help right away if you are not doing well or get worse.   This information is not intended to replace advice given to  you by your health care provider. Make sure you discuss any questions you have with your health care provider.   Document Released: 08/27/2001 Document Revised: 09/23/2014 Document Reviewed: 08/25/2013 Elsevier Interactive Patient Education 2016 Elsevier Inc.  

## 2016-01-26 ENCOUNTER — Other Ambulatory Visit: Payer: Self-pay | Admitting: Family Medicine

## 2016-02-08 ENCOUNTER — Other Ambulatory Visit: Payer: Self-pay | Admitting: Family Medicine

## 2016-02-09 NOTE — Telephone Encounter (Signed)
Pt has not had any recent f/u appts. pls advise

## 2016-02-28 ENCOUNTER — Other Ambulatory Visit: Payer: Self-pay | Admitting: Family Medicine

## 2016-03-01 NOTE — Telephone Encounter (Signed)
This pt has not been seen for BP f/u since 12/2014

## 2016-03-05 ENCOUNTER — Other Ambulatory Visit: Payer: Self-pay | Admitting: Family Medicine

## 2016-03-07 ENCOUNTER — Other Ambulatory Visit: Payer: Self-pay | Admitting: Family Medicine

## 2016-03-07 NOTE — Telephone Encounter (Signed)
Pt request status of fluoxetine refill; advised already refilled to [facility]; pt will ck with pharmacy.

## 2016-03-15 IMAGING — DX DG CHEST 2V
2 series · 2 of 2 positions shown · non-contrast
Comparison: 05/03/2015

CLINICAL DATA: Cough x5 days, shortness of breath, history of
ovarian cancer status post chemotherapy 4 weeks ago

EXAM:
CHEST  2 VIEW

[chest pa]
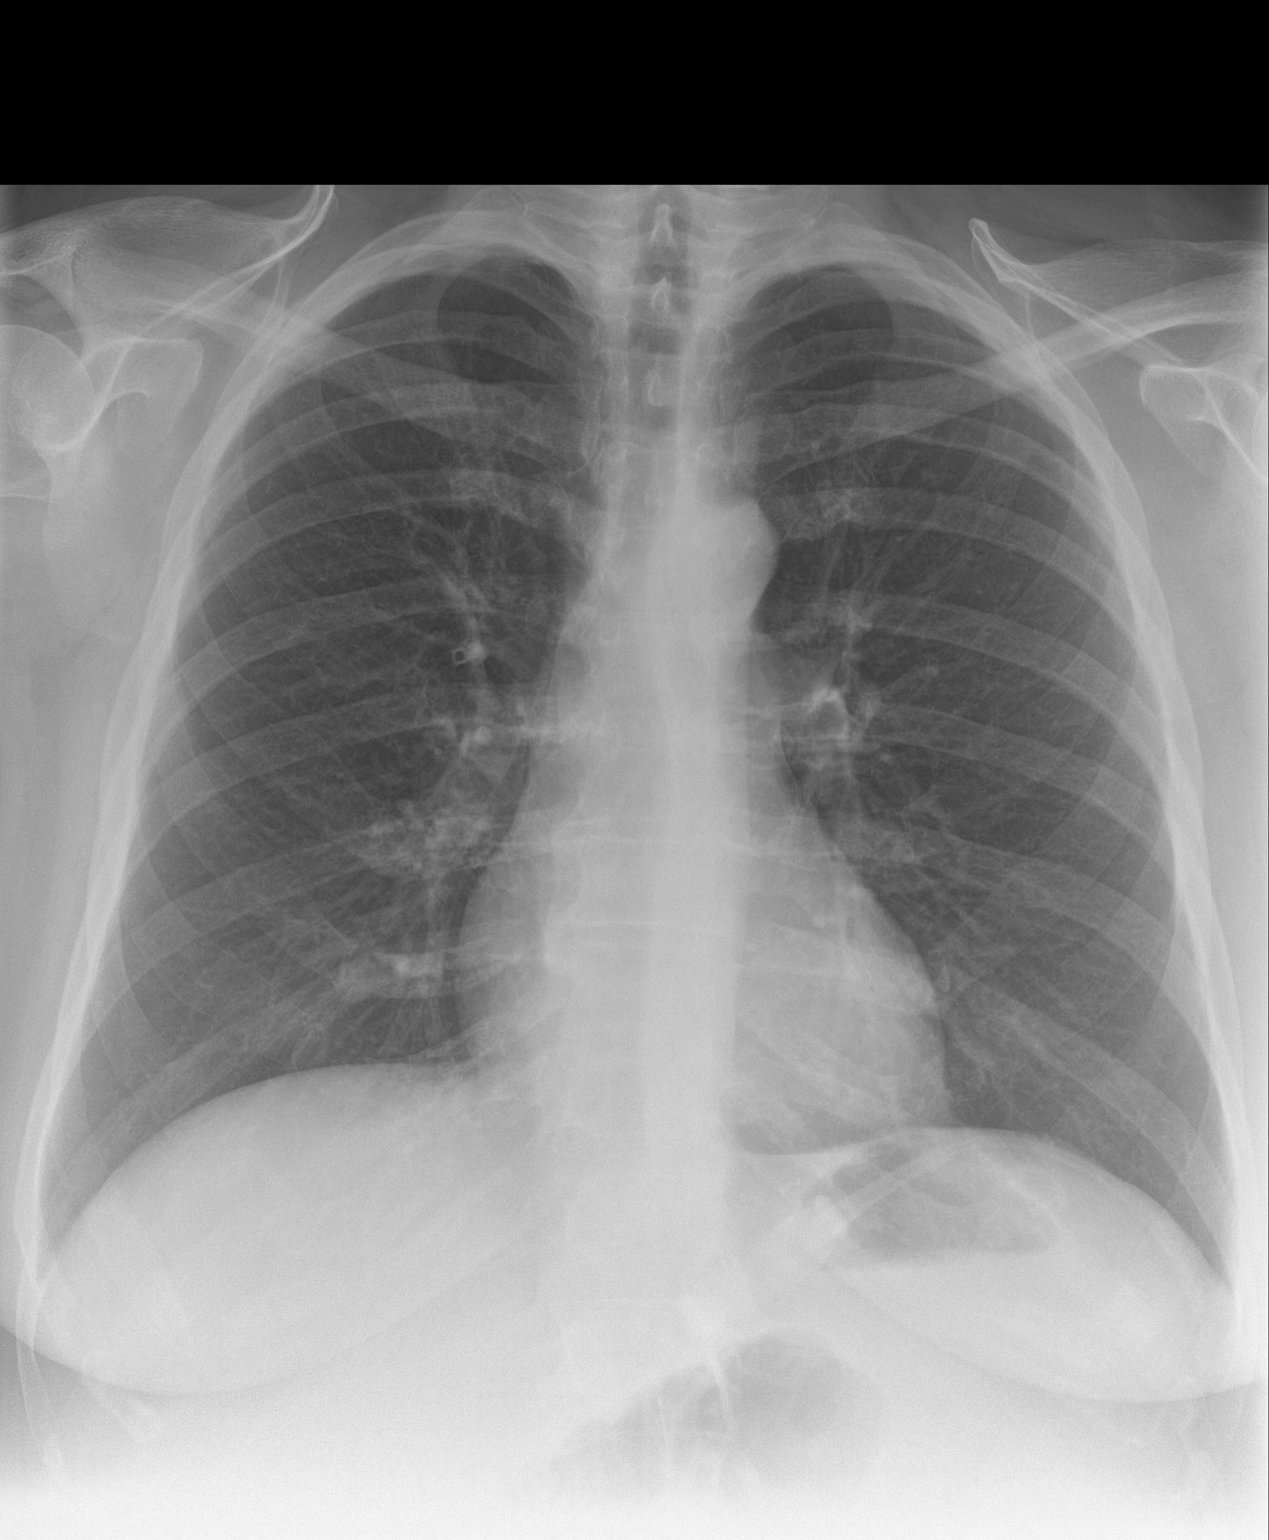

[chest lat]
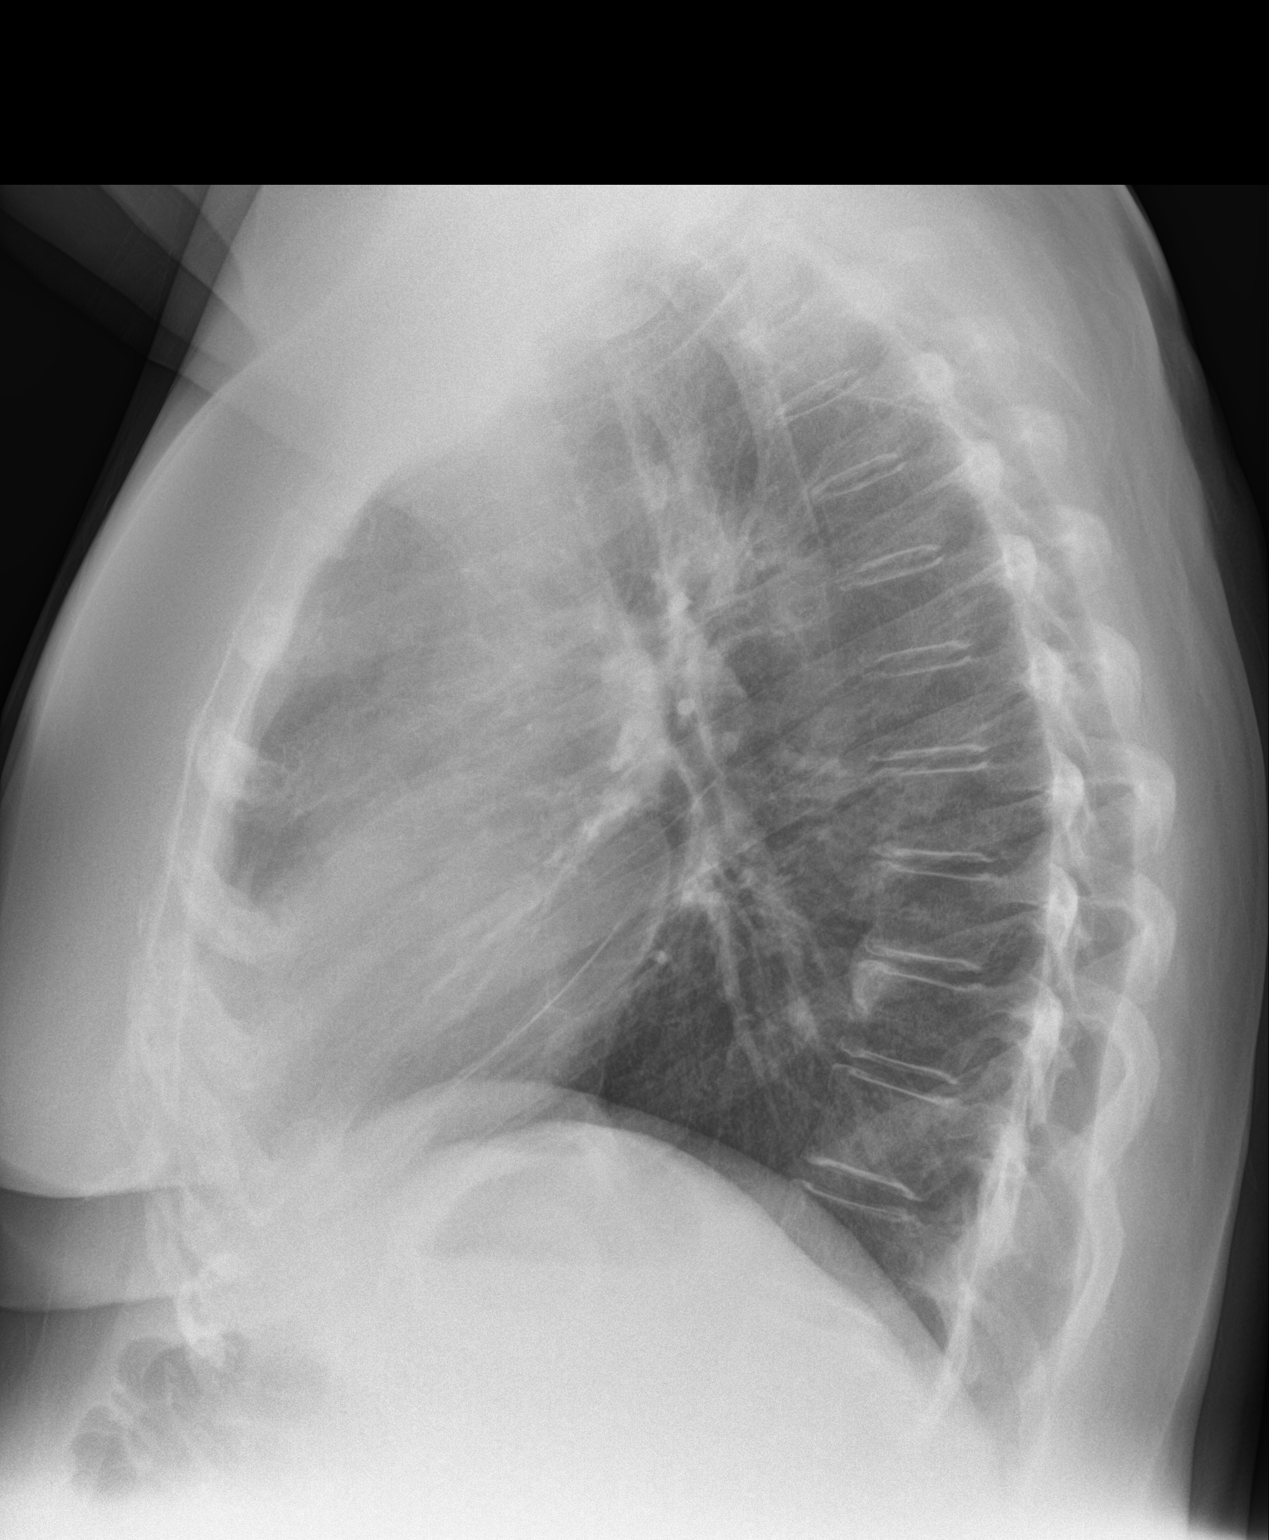

[2 of 2 positions shown; findings below may reference images not displayed]

FINDINGS: Lungs are clear.  No pleural effusion or pneumothorax.

The heart is normal in size.

Visualized osseous structures are within normal limits.
IMPRESSION: No evidence of acute cardiopulmonary disease.

## 2016-03-29 ENCOUNTER — Other Ambulatory Visit: Payer: Self-pay | Admitting: Family Medicine

## 2016-04-12 ENCOUNTER — Other Ambulatory Visit: Payer: Self-pay | Admitting: Family Medicine

## 2016-05-10 ENCOUNTER — Other Ambulatory Visit: Payer: Self-pay | Admitting: Family Medicine

## 2016-05-10 NOTE — Telephone Encounter (Signed)
Mr Amendt left v/m requesting refill buspar to [facility] [address]. Last refilled # 90 on 02/09/16. Pt last acute appt 01/10/16 but no recent f/u appt and no future appt scheduled.

## 2016-05-14 ENCOUNTER — Ambulatory Visit: Payer: Medicare Other | Admitting: Family Medicine

## 2016-05-15 ENCOUNTER — Encounter: Payer: Self-pay | Admitting: Family Medicine

## 2016-05-15 ENCOUNTER — Ambulatory Visit (INDEPENDENT_AMBULATORY_CARE_PROVIDER_SITE_OTHER): Payer: Medicare Other | Admitting: Family Medicine

## 2016-05-15 VITALS — BP 126/64 | HR 70 | Temp 98.1°F | Wt 234.8 lb

## 2016-05-15 DIAGNOSIS — C569 Malignant neoplasm of unspecified ovary: Secondary | ICD-10-CM | POA: Diagnosis not present

## 2016-05-15 NOTE — Assessment & Plan Note (Signed)
>  25 minutes spent in face to face time with patient, >50% spent in counselling or coordination of care discussing end of life care and ovarian CA. I answered her questions the best I could. I advised awaiting results from nuclear scan and then talking with her oncologist and possibly her pastor for spiritual counseling since she referred to that today. The patient indicates understanding of these issues and agrees with the plan.

## 2016-05-15 NOTE — Progress Notes (Signed)
Subjective:   Patient ID: [name], female    DOB: [birth year], 65 y.o.   MRN: [id number]  [name] is a pleasant 65 y.o. year old female who presents to clinic today with Follow-up and Cancer (ovarian cancer has returned. wants to discuss nuclear scan)  on 05/15/2016  HPI: Ovarian CA- recently told that she is no longer receiving curative treatment, now palliative.  Has a nuclear scan tomorrow.  Cannot talk to her family about how she is feeling and would like to discuss this with me today.  Not sure how to deal with this- "how much time do I have?"  Currently not in any pain.  "I actually feel good."  No nausea or vomiting.  Appetite ok.  Wt Readings from Last 3 Encounters:  05/15/16 234 lb 12 oz (106.5 kg)  01/08/16 214 lb (97.1 kg)  11/27/15 206 lb 8 oz (93.7 kg)   Current Outpatient Prescriptions on File Prior to Visit  Medication Sig Dispense Refill  . albuterol (PROVENTIL HFA;VENTOLIN HFA) 108 (90 BASE) MCG/ACT inhaler Inhale 2 puffs into the lungs every 6 (six) hours as needed for wheezing or shortness of breath. 1 Inhaler 0  . busPIRone (BUSPAR) 10 MG tablet TAKE ONE TABLET BY MOUTH AT BEDTIME 90 tablet 0  . cycloSPORINE (RESTASIS) 0.05 % ophthalmic emulsion Place 1 drop into both eyes 2 (two) times daily.    Marland Kitchen FLUoxetine (PROZAC) 20 MG capsule TAKE THREE CAPSULES BY MOUTH ONCE DAILY 90 capsule 2  . glucose blood (ONE TOUCH ULTRA TEST) test strip Check blood sugar four times a day. Dx 250.70 (Patient taking differently: 1 each by Other route 2 (two) times daily. Check blood sugar in the am . Dx 250.70) 100 each 5  . glyBURIDE (DIABETA) 5 MG tablet TAKE ONE TABLET BY MOUTH TWICE DAILY WITH MEALS 180 tablet 1  . lisinopril (PRINIVIL,ZESTRIL) 10 MG tablet TAKE ONE TABLET BY MOUTH ONCE DAILY 30 tablet 5  . lovastatin (MEVACOR) 20 MG tablet TAKE ONE TABLET BY MOUTH AT BEDTIME 90 tablet 0  . metFORMIN (GLUCOPHAGE) 500 MG tablet TAKE ONE TABLET BY MOUTH TWICE DAILY  WITH  A  MEAL 180 tablet 5  . ondansetron (ZOFRAN) 8 MG tablet Take 1 tablet (8 mg total) by mouth every 8 (eight) hours as needed for nausea or vomiting. Will not make drowsy. 30 tablet 1  . ranitidine (ZANTAC) 150 MG tablet TAKE ONE TABLET BY MOUTH TWICE DAILY 180 tablet 2  . zoster vaccine live, PF, (ZOSTAVAX) 16109 UNT/0.65ML injection Inject 19,400 Units into the skin once. 1 each 0   No current facility-administered medications on file prior to visit.     Allergies  Allergen Reactions  . Aspirin Other (See Comments)    asthma  . Lorazepam Shortness Of Breath  . Codeine Nausea And Vomiting    Past Medical History:  Diagnosis Date  . Acute upper respiratory infections of unspecified site   . Anemia, unspecified   . Anxiety state, unspecified   . Cellulitis   . Chronic diastolic heart failure ([place])   . Congestive heart failure, unspecified   . Contact dermatitis and other eczema, due to unspecified cause   . Depressive disorder, not elsewhere classified   . Diabetes mellitus   . Essential hypertension, benign   . Mastoiditis   . Other and unspecified hyperlipidemia   . Other diseases of lung, not elsewhere classified   . Other dyspnea and respiratory abnormality   . Recent  retinal detachment, partial, with giant tear   . Type II or unspecified type diabetes mellitus with peripheral circulatory disorders, not stated as uncontrolled(250.70)   . Unspecified asthma, with exacerbation   . Unspecified urinary incontinence   . Wheezing     Past Surgical History:  Procedure Laterality Date  . ABDOMINAL HYSTERECTOMY    . DILATION AND CURETTAGE OF UTERUS    . EYE SURGERY     Left retina repair- 11/09, 02-20-09  . KNEE SURGERY  2003   meniscus  . LAPAROSCOPIC GASTRIC BANDING  08/21/10    Family History  Problem Relation Age of Onset  . Cancer Mother     breast  . Cancer Sister     breast  . Cancer Maternal Aunt     breast  . Cancer Maternal Uncle     breast  . Cancer  Maternal Grandmother     breast  . Cancer Maternal Uncle     Social History   Social History  . Marital status: Married    Spouse name: N/A  . Number of children: N/A  . Years of education: N/A   Occupational History  . School bus driver Retired   Social History Main Topics  . Smoking status: Never Smoker  . Smokeless tobacco: Never Used  . Alcohol use No  . Drug use: No  . Sexual activity: Not on file   Other Topics Concern  . Not on file   Social History Narrative  . No narrative on file   The PMH, PSH, Social History, Family History, Medications, and allergies have been reviewed in [facility], and have been updated if relevant.   Review of Systems  Constitutional: Negative.   Respiratory: Negative.   Cardiovascular: Negative.   Genitourinary: Negative.   Psychiatric/Behavioral: Negative for self-injury, sleep disturbance and suicidal ideas. The patient is nervous/anxious.   All other systems reviewed and are negative.      Objective:    BP 126/64   Pulse 70   Temp 98.1 F (36.7 C) (Oral)   Wt 234 lb 12 oz (106.5 kg)   SpO2 96%   BMI 36.77 kg/m    Physical Exam  Constitutional: She is oriented to person, place, and time. She appears well-developed and well-nourished. No distress.  HENT:  Head: Normocephalic.  Cardiovascular: Normal rate.   Neurological: She is alert and oriented to person, place, and time. No cranial nerve deficit.  Skin: Skin is warm and dry. She is not diaphoretic.  Psychiatric: She has a normal mood and affect. Her behavior is normal. Judgment and thought content normal.  Nursing note and vitals reviewed.         Assessment & Plan:   Ovarian cancer, unspecified laterality ([place]) No Follow-up on file.

## 2016-05-15 NOTE — Progress Notes (Signed)
Pre visit review using our clinic review tool, if applicable. No additional management support is needed unless otherwise documented below in the visit note. 

## 2016-06-06 ENCOUNTER — Telehealth: Payer: Self-pay

## 2016-06-06 MED ORDER — INSULIN GLARGINE 100 UNIT/ML SOLOSTAR PEN: 15.0000 [IU] | PEN_INJECTOR | Freq: Every day | SUBCUTANEOUS | 99 refills | Status: DC

## 2016-06-06 NOTE — Telephone Encounter (Signed)
I cannot find Lantus on her past med list.  How much was she taking?

## 2016-06-06 NOTE — Telephone Encounter (Signed)
Noted.  eRx for lantus 15 units nightly sent to pharmacy on file.

## 2016-06-06 NOTE — Telephone Encounter (Signed)
Pt left v/m; pt recently saw doctor at [facility] and advised pt to contact PCP to restart lantus. 06/06/16 FBS 242 and yesterday BS was 277. Pt last seen 05/15/16 and last Hbg A1C 10/12/15.  Pt request cb.

## 2016-06-06 NOTE — Telephone Encounter (Signed)
Spoke to pt who states she was taking 15units of lantus. Charts past med Hx states 20units prescribed in 2014 which she states was continuous until she was Dx with Cancer and med was d/c by Dr Deborra Medina,

## 2016-06-10 ENCOUNTER — Other Ambulatory Visit: Payer: Self-pay | Admitting: Family Medicine

## 2016-06-11 MED ORDER — INSULIN GLARGINE 100 UNIT/ML SOLOSTAR PEN: 15.0000 [IU] | PEN_INJECTOR | Freq: Every day | SUBCUTANEOUS | 99 refills | Status: DC

## 2016-06-11 NOTE — Telephone Encounter (Signed)
Pt said pens are more expensive than the vial and pt request # 1 vial instead of 5 pens. Spoke with Jana Half at [address] and changed to # 1 vial. Per epic guideline I put in 10 ml for one vial and left the prn refill per Dr Deborra Medina previously. Pt notified done as requested and FYI to Dr Deborra Medina.

## 2016-06-12 NOTE — Telephone Encounter (Signed)
Pt has had f/u but depression has not been discussed in 3+yrs

## 2016-06-12 NOTE — Telephone Encounter (Signed)
Lm on pts vm and advised pt OV required

## 2016-06-12 NOTE — Telephone Encounter (Signed)
Ok to refill.  Make 30 min follow up appt

## 2016-07-12 ENCOUNTER — Telehealth: Payer: Self-pay

## 2016-07-12 NOTE — Telephone Encounter (Signed)
V/M left requesting refill onetouch ultra blue test strip and ultra soft lancets. No pharmacy was left. Left v/m requesting pt to cb.

## 2016-07-15 ENCOUNTER — Telehealth: Payer: Self-pay

## 2016-07-15 ENCOUNTER — Other Ambulatory Visit: Payer: Self-pay | Admitting: Family Medicine

## 2016-07-15 NOTE — Telephone Encounter (Signed)
Pt has been using her husbands diabetic supplies and now wants to get her own. Pt will ck with ins co to see which diabetic supplies are approved and pt will cb with info.

## 2016-07-18 ENCOUNTER — Telehealth: Payer: Self-pay

## 2016-07-18 MED ORDER — GLUCOSE BLOOD VI STRP: ORAL_STRIP | 5 refills | Status: DC

## 2016-07-18 MED ORDER — ONETOUCH DELICA LANCETS FINE MISC: 5 refills | Status: DC

## 2016-07-18 MED ORDER — ONETOUCH ULTRA 2 W/DEVICE KIT: PACK | 0 refills | Status: DC

## 2016-07-18 NOTE — Telephone Encounter (Signed)
Pt has spoken with ins co and request onetouch ultra 2 meter, test strips and lancets to [facility] [address]. Advised pt done.

## 2016-07-26 ENCOUNTER — Encounter (HOSPITAL_COMMUNITY): Payer: Self-pay

## 2016-08-13 ENCOUNTER — Other Ambulatory Visit: Payer: Self-pay | Admitting: Family Medicine

## 2016-08-14 NOTE — Telephone Encounter (Signed)
This pt has not had any recent f/u and last Rx indicates that an OV is required

## 2016-08-17 ENCOUNTER — Other Ambulatory Visit: Payer: Self-pay | Admitting: Family Medicine

## 2016-08-19 NOTE — Telephone Encounter (Signed)
Pt has not had any recent f/u. pls advise 

## 2016-08-22 ENCOUNTER — Ambulatory Visit (INDEPENDENT_AMBULATORY_CARE_PROVIDER_SITE_OTHER): Payer: Medicare Other

## 2016-08-22 VITALS — BP 120/68 | HR 70 | Temp 97.9°F | Ht 65.0 in | Wt 241.8 lb

## 2016-08-22 DIAGNOSIS — Z794 Long term (current) use of insulin: Secondary | ICD-10-CM | POA: Diagnosis not present

## 2016-08-22 DIAGNOSIS — Z1159 Encounter for screening for other viral diseases: Secondary | ICD-10-CM

## 2016-08-22 DIAGNOSIS — Z0000 Encounter for general adult medical examination without abnormal findings: Secondary | ICD-10-CM

## 2016-08-22 DIAGNOSIS — E08319 Diabetes mellitus due to underlying condition with unspecified diabetic retinopathy without macular edema: Secondary | ICD-10-CM | POA: Diagnosis not present

## 2016-08-22 LAB — HEMOGLOBIN A1C: Hgb A1c MFr Bld: 7.9 % — ABNORMAL HIGH (ref 4.6–6.5)

## 2016-08-22 NOTE — Progress Notes (Signed)
Subjective:   [name] is a 65 y.o. female who presents for an Initial Medicare Annual Wellness Visit.  Review of Systems    N/A  Cardiac Risk Factors include: advanced age (>16mn, >>82women);obesity (BMI >30kg/m2);diabetes mellitus;dyslipidemia;hypertension     Objective:    Today's Vitals   08/22/16 1053  BP: 120/68  Pulse: 70  Temp: 97.9 F (36.6 C)  TempSrc: Oral  SpO2: 96%  Weight: 241 lb 12 oz (109.7 kg)  Height: 5' 5"  (1.651 m)  PainSc: 0-No pain   Body mass index is 40.23 kg/m.   Current Medications (verified) Outpatient Encounter Prescriptions as of 08/22/2016  Medication Sig  . albuterol (PROVENTIL HFA;VENTOLIN HFA) 108 (90 BASE) MCG/ACT inhaler Inhale 2 puffs into the lungs every 6 (six) hours as needed for wheezing or shortness of breath.  . Blood Glucose Monitoring Suppl (ONE TOUCH ULTRA 2) w/Device KIT Check blood sugar three times a day and as directed. Dx E11.319  . busPIRone (BUSPAR) 10 MG tablet TAKE ONE TABLET BY MOUTH AT BEDTIME  . busPIRone (BUSPAR) 10 MG tablet TAKE ONE TABLET BY MOUTH AT BEDTIME  . cycloSPORINE (RESTASIS) 0.05 % ophthalmic emulsion Place 1 drop into both eyes 2 (two) times daily.  .Marland KitchenFLUoxetine (PROZAC) 20 MG capsule TAKE THREE CAPSULES BY MOUTH ONCE DAILY  . FLUoxetine (PROZAC) 20 MG capsule TAKE THREE CAPSULES BY MOUTH ONCE DAILY *OFFICE VISIT REQUIRED FOR ADDITIONAL REFILLS*  . glucose blood (ONE TOUCH ULTRA TEST) test strip Check blood sugar three times a day and as directed. Dx EZ32.992 . glyBURIDE (DIABETA) 5 MG tablet TAKE ONE TABLET BY MOUTH TWICE DAILY WITH MEALS  . Insulin Glargine (LANTUS SOLOSTAR) 100 UNIT/ML Solostar Pen Inject 15 Units into the skin daily at 10 pm.  . lisinopril (PRINIVIL,ZESTRIL) 10 MG tablet TAKE ONE TABLET BY MOUTH ONCE DAILY  . lovastatin (MEVACOR) 20 MG tablet TAKE ONE TABLET BY MOUTH AT BEDTIME  . metFORMIN (GLUCOPHAGE) 500 MG tablet TAKE ONE TABLET BY MOUTH TWICE DAILY WITH  A  MEAL  .  ondansetron (ZOFRAN) 8 MG tablet Take 1 tablet (8 mg total) by mouth every 8 (eight) hours as needed for nausea or vomiting. Will not make drowsy.  .Glory RosebushDELICA LANCETS FINE MISC Check blood sugar three times a day and as directed. Dx EE26.834 . ranitidine (ZANTAC) 150 MG tablet TAKE ONE TABLET BY MOUTH TWICE DAILY  . zoster vaccine live, PF, (ZOSTAVAX) 119622UNT/0.65ML injection Inject 19,400 Units into the skin once. (Patient not taking: Reported on 08/22/2016)   No facility-administered encounter medications on file as of 08/22/2016.     Allergies (verified) Aspirin; Lorazepam; and Codeine   History: Past Medical History:  Diagnosis Date  . Acute upper respiratory infections of unspecified site   . Anemia, unspecified   . Anxiety state, unspecified   . Cellulitis   . Chronic diastolic heart failure ([place]   . Congestive heart failure, unspecified   . Contact dermatitis and other eczema, due to unspecified cause   . Depressive disorder, not elsewhere classified   . Diabetes mellitus   . Essential hypertension, benign   . Mastoiditis   . Other and unspecified hyperlipidemia   . Other diseases of lung, not elsewhere classified   . Other dyspnea and respiratory abnormality   . Recent retinal detachment, partial, with giant tear   . Type II or unspecified type diabetes mellitus with peripheral circulatory disorders, not stated as uncontrolled(250.70)   . Unspecified asthma,  with exacerbation   . Unspecified urinary incontinence   . Wheezing    Past Surgical History:  Procedure Laterality Date  . ABDOMINAL HYSTERECTOMY    . DILATION AND CURETTAGE OF UTERUS    . EYE SURGERY     Left retina repair- 11/09, 02-20-09  . KNEE SURGERY  2003   meniscus  . LAPAROSCOPIC GASTRIC BANDING  08/21/10   Family History  Problem Relation Age of Onset  . Cancer Mother     breast  . Cancer Sister     breast  . Cancer Maternal Aunt     breast  . Cancer Maternal Uncle     breast  . Cancer  Maternal Grandmother     breast  . Cancer Maternal Uncle    Social History   Occupational History  . School bus driver Retired   Social History Main Topics  . Smoking status: Never Smoker  . Smokeless tobacco: Never Used  . Alcohol use No  . Drug use: No  . Sexual activity: No    Tobacco Counseling Counseling given: No   Activities of Daily Living In your present state of health, do you have any difficulty performing the following activities: 08/22/2016  Hearing? N  Vision? Y  Difficulty concentrating or making decisions? N  Walking or climbing stairs? N  Dressing or bathing? N  Doing errands, shopping? N  Preparing Food and eating ? N  Using the Toilet? N  In the past six months, have you accidently leaked urine? Y  Do you have problems with loss of bowel control? N  Managing your Medications? N  Managing your Finances? N  Housekeeping or managing your Housekeeping? N  Some recent data might be hidden    Immunizations and Health Maintenance Immunization History  Administered Date(s) Administered  . Influenza Whole 08/09/2009  . Influenza, Seasonal, Injecte, Preservative Fre 08/27/2012  . Influenza,inj,Quad PF,36+ Mos 09/08/2013, 06/30/2014  . Pneumococcal Conjugate-13 12/27/2014  . Pneumococcal Polysaccharide-23 08/27/2012  . Td 01/28/2006   There are no preventive care reminders to display for this patient.  Patient Care Team: Lucille Passy, MD as PCP - Haines, RD (Inactive) as Dietitian (Bariatrics) Mellody Drown, MD as Referring Physician (Obstetrics and Gynecology)     Assessment:   This is a routine wellness examination for [name].  Hearing/Vision screen  Hearing Screening   125Hz  250Hz  500Hz  1000Hz  2000Hz  3000Hz  4000Hz  6000Hz  8000Hz   Right ear:   0 0 40  40    Left ear:   0 0 40  40    Vision Screening Comments: Last vision exam in Nov 2017 with Dr. Starr Sinclair  Dietary issues and exercise activities discussed: Current Exercise  Habits: The patient does not participate in regular exercise at present, Exercise limited by: Other - see comments (pt has cancer )  Goals    . management of cancer          Starting 08/22/2016, I will continue to follow the cancer treatment plan prescribed by oncologist.       Depression Screen PHQ 2/9 Scores 08/22/2016  PHQ - 2 Score 0    Fall Risk Fall Risk  08/22/2016  Falls in the past year? Yes  Number falls in past yr: 1  Injury with Fall? No  Follow up Falls evaluation completed    Cognitive Function: MMSE - Mini Mental State Exam 08/22/2016  Orientation to time 5  Orientation to Place 5  Registration 3  Attention/ Calculation 0  Recall 3  Language- name 2 objects 0  Language- repeat 1  Language- follow 3 step command 3  Language- read & follow direction 0  Write a sentence 0  Copy design 0  Total score 20     PLEASE NOTE: A Mini-Cog screen was completed. Maximum score is 20. A value of 0 denotes this part of [place] MMSE was not completed or the patient failed this part of the Mini-Cog screening.   Mini-Cog Screening Orientation to Time - Max 5 pts Orientation to Place - Max 5 pts Registration - Max 3 pts Recall - Max 3 pts Language Repeat - Max 1 pts Language Follow 3 Step Command - Max 3 pts     Screening Tests Health Maintenance  Topic Date Due  . FOOT EXAM  08/22/2017 (Originally 03/25/2015)  . DEXA SCAN  08/22/2017 (Originally 05/22/2014)  . ZOSTAVAX  08/22/2017 (Originally 05/22/2009)  . TETANUS/TDAP  01/27/2026 (Originally 01/29/2016)  . MAMMOGRAM  01/11/2017  . HEMOGLOBIN A1C  02/20/2017  . OPHTHALMOLOGY EXAM  08/13/2017  . PNA vac Low Risk Adult (2 of 2 - PPSV23) 08/27/2017  . COLONOSCOPY  02/23/2020  . INFLUENZA VACCINE  Completed  . Hepatitis C Screening  Completed      Plan:   I have personally reviewed and addressed the Medicare Annual Wellness questionnaire and have noted the following in the patient's chart:  A. Medical and social  history B. Use of alcohol, tobacco or illicit drugs  C. Current medications and supplements D. Functional ability and status E.  Nutritional status F.  Physical activity G. Advance directives H. List of other physicians I.  Hospitalizations, surgeries, and ER visits in previous 12 months J.  [place] to include hearing, vision, cognitive, depression L. Referrals and appointments - none  In addition, I have reviewed and discussed with patient certain preventive protocols, quality metrics, and best practice recommendations. A written personalized care plan for preventive services as well as general preventive health recommendations were provided to patient.  See attached scanned questionnaire for additional information.   Signed,   Lindell Noe, MHA, BS, LPN Health Coach

## 2016-08-22 NOTE — Progress Notes (Signed)
Pre visit review using our clinic review tool, if applicable. No additional management support is needed unless otherwise documented below in the visit note. 

## 2016-08-22 NOTE — Progress Notes (Signed)
PCP notes:   Health maintenance:  Foot exam - pt will complete with PCP at next appt Bone density - pt will discuss with PCP Shingles- pt will discuss with PCP Tetanus vaccine - pt will discuss with PCP A1C - completed Hep C screening - completed Flu vaccine - per pt report, vaccine administered in Nov 2017 Eye exam - per pt report, eye exam completed in Nov 2017  Abnormal screenings:   Fall risk - hx of fall without injury Hearing - failed  Patient concerns:   Pt has ongoing concerns with managing health with cancer diagnosis.   Nurse concerns:  None  Next PCP appt:   09/02/16 @ 1200

## 2016-08-22 NOTE — Patient Instructions (Signed)
[name] , Thank you for taking time to come for your Medicare Wellness Visit. I appreciate your ongoing commitment to your health goals. Please review the following plan we discussed and let me know if I can assist you in the future.   These are the goals we discussed: Goals    . management of cancer          Starting 08/22/2016, I will continue to follow the cancer treatment plan prescribed by oncologist.        This is a list of the screening recommended for you and due dates:  Health Maintenance  Topic Date Due  . Complete foot exam   08/22/2017*  . DEXA scan (bone density measurement)  08/22/2017*  . Shingles Vaccine  08/22/2017*  . Tetanus Vaccine  01/27/2026*  . Mammogram  01/11/2017  . Hemoglobin A1C  02/20/2017  . Eye exam for diabetics  08/13/2017  . Pneumonia vaccines (2 of 2 - PPSV23) 08/27/2017  . Colon Cancer Screening  02/23/2020  . Flu Shot  Completed  .  Hepatitis C: One time screening is recommended by Center for Disease Control  (CDC) for  adults born from [birth year] through 1967.   Completed  *Topic was postponed. The date shown is not the original due date.   Preventive Care for Adults  A healthy lifestyle and preventive care can promote health and wellness. Preventive health guidelines for adults include the following key practices.  . A routine yearly physical is a good way to check with your health care provider about your health and preventive screening. It is a chance to share any concerns and updates on your health and to receive a thorough exam.  . Visit your dentist for a routine exam and preventive care every 6 months. Brush your teeth twice a day and floss once a day. Good oral hygiene prevents tooth decay and gum disease.  . The frequency of eye exams is based on your age, health, family medical history, use  of contact lenses, and other factors. Follow your health care provider's ecommendations for frequency of eye exams.  . Eat a healthy diet.  Foods like vegetables, fruits, whole grains, low-fat dairy products, and lean protein foods contain the nutrients you need without too many calories. Decrease your intake of foods high in solid fats, added sugars, and salt. Eat the right amount of calories for you. Get information about a proper diet from your health care provider, if necessary.  . Regular physical exercise is one of the most important things you can do for your health. Most adults should get at least 150 minutes of moderate-intensity exercise (any activity that increases your heart rate and causes you to sweat) each week. In addition, most adults need muscle-strengthening exercises on 2 or more days a week.  Silver Sneakers may be a benefit available to you. To determine eligibility, you may visit the website: www.silversneakers.com or contact program at 9716789680 Mon-Fri between 8AM-8PM.   . Maintain a healthy weight. The body mass index (BMI) is a screening tool to identify possible weight problems. It provides an estimate of body fat based on height and weight. Your health care provider can find your BMI and can help you achieve or maintain a healthy weight.   For adults 20 years and older: ? A BMI below 18.5 is considered underweight. ? A BMI of 18.5 to 24.9 is normal. ? A BMI of 25 to 29.9 is considered overweight. ? A BMI of 30 and  above is considered obese.   . Maintain normal blood lipids and cholesterol levels by exercising and minimizing your intake of saturated fat. Eat a balanced diet with plenty of fruit and vegetables. Blood tests for lipids and cholesterol should begin at age 38 and be repeated every 5 years. If your lipid or cholesterol levels are high, you are over 50, or you are at high risk for heart disease, you may need your cholesterol levels checked more frequently. Ongoing high lipid and cholesterol levels should be treated with medicines if diet and exercise are not working.  . If you smoke, find out  from your health care provider how to quit. If you do not use tobacco, please do not start.  . If you choose to drink alcohol, please do not consume more than 2 drinks per day. One drink is considered to be 12 ounces (355 mL) of beer, 5 ounces (148 mL) of wine, or 1.5 ounces (44 mL) of liquor.  . If you are 87-20 years old, ask your health care provider if you should take aspirin to prevent strokes.  . Use sunscreen. Apply sunscreen liberally and repeatedly throughout the day. You should seek shade when your shadow is shorter than you. Protect yourself by wearing long sleeves, pants, a wide-brimmed hat, and sunglasses year round, whenever you are outdoors.  . Once a month, do a whole body skin exam, using a mirror to look at the skin on your back. Tell your health care provider of new moles, moles that have irregular borders, moles that are larger than a pencil eraser, or moles that have changed in shape or color.

## 2016-08-23 ENCOUNTER — Other Ambulatory Visit: Payer: Medicare Other

## 2016-08-23 LAB — HEPATITIS C ANTIBODY: HCV Ab: NEGATIVE

## 2016-08-23 NOTE — Progress Notes (Signed)
I reviewed health advisor's note, was available for consultation, and agree with documentation and plan.  

## 2016-09-02 ENCOUNTER — Ambulatory Visit (INDEPENDENT_AMBULATORY_CARE_PROVIDER_SITE_OTHER): Payer: Medicare Other | Admitting: Family Medicine

## 2016-09-02 VITALS — BP 138/72 | HR 79 | Temp 98.0°F | Ht 65.25 in | Wt 245.8 lb

## 2016-09-02 DIAGNOSIS — I10 Essential (primary) hypertension: Secondary | ICD-10-CM

## 2016-09-02 DIAGNOSIS — F411 Generalized anxiety disorder: Secondary | ICD-10-CM

## 2016-09-02 DIAGNOSIS — R609 Edema, unspecified: Secondary | ICD-10-CM | POA: Diagnosis not present

## 2016-09-02 DIAGNOSIS — C801 Malignant (primary) neoplasm, unspecified: Secondary | ICD-10-CM

## 2016-09-02 DIAGNOSIS — E785 Hyperlipidemia, unspecified: Secondary | ICD-10-CM

## 2016-09-02 DIAGNOSIS — Z01419 Encounter for gynecological examination (general) (routine) without abnormal findings: Secondary | ICD-10-CM | POA: Insufficient documentation

## 2016-09-02 DIAGNOSIS — Z0000 Encounter for general adult medical examination without abnormal findings: Secondary | ICD-10-CM | POA: Diagnosis not present

## 2016-09-02 DIAGNOSIS — I5032 Chronic diastolic (congestive) heart failure: Secondary | ICD-10-CM

## 2016-09-02 DIAGNOSIS — C786 Secondary malignant neoplasm of retroperitoneum and peritoneum: Secondary | ICD-10-CM

## 2016-09-02 DIAGNOSIS — E083513 Diabetes mellitus due to underlying condition with proliferative diabetic retinopathy with macular edema, bilateral: Secondary | ICD-10-CM

## 2016-09-02 DIAGNOSIS — E08311 Diabetes mellitus due to underlying condition with unspecified diabetic retinopathy with macular edema: Secondary | ICD-10-CM

## 2016-09-02 LAB — BRAIN NATRIURETIC PEPTIDE: Pro B Natriuretic peptide (BNP): 176 pg/mL — ABNORMAL HIGH (ref 0.0–100.0)

## 2016-09-02 LAB — LIPID PANEL
CHOL/HDL RATIO: 4
CHOLESTEROL: 144 mg/dL (ref 0–200)
HDL: 35 mg/dL — AB (ref >39.00)
NONHDL: 109.43
TRIGLYCERIDES: 261 mg/dL — AB (ref 0.0–149.0)
VLDL: 52.2 mg/dL — AB (ref 0.0–40.0)

## 2016-09-02 LAB — COMPREHENSIVE METABOLIC PANEL
ALT: 14 U/L (ref 0–35)
AST: 12 U/L (ref 0–37)
Albumin: 3.9 g/dL (ref 3.5–5.2)
Alkaline Phosphatase: 70 U/L (ref 39–117)
BUN: 13 mg/dL (ref 6–23)
CALCIUM: 8.9 mg/dL (ref 8.4–10.5)
CHLORIDE: 101 meq/L (ref 96–112)
CO2: 29 meq/L (ref 19–32)
Creatinine, Ser: 1.02 mg/dL (ref 0.40–1.20)
GFR: 57.4 mL/min — AB (ref >60.00)
GLUCOSE: 187 mg/dL — AB (ref 70–99)
POTASSIUM: 4.6 meq/L (ref 3.5–5.1)
Sodium: 136 mEq/L (ref 135–145)
Total Bilirubin: 0.4 mg/dL (ref 0.2–1.2)
Total Protein: 7.1 g/dL (ref 6.0–8.3)

## 2016-09-02 LAB — LDL CHOLESTEROL, DIRECT: LDL DIRECT: 84 mg/dL

## 2016-09-02 MED ORDER — INSULIN GLARGINE 100 UNIT/ML SOLOSTAR PEN: 20.0000 [IU] | PEN_INJECTOR | Freq: Every day | SUBCUTANEOUS | 6 refills | Status: DC

## 2016-09-02 NOTE — Assessment & Plan Note (Signed)
Well controlled 

## 2016-09-02 NOTE — Progress Notes (Signed)
Pre visit review using our clinic review tool, if applicable. No additional management support is needed unless otherwise documented below in the visit note. 

## 2016-09-02 NOTE — Patient Instructions (Addendum)
Great to see you. Happy Holidays.  Please increase your Lantus to 20 units.  We will call you with your results from today.  Check with your insurance to see if they will cover the shingles shot.

## 2016-09-02 NOTE — Assessment & Plan Note (Signed)
Deteriorated. Likely multifactorial. Check BNP today. CMET.

## 2016-09-02 NOTE — Progress Notes (Signed)
Subjective:   Patient ID: [name], female    DOB: 18-Feb-1951, 65 y.o.   MRN: [id number]  [name] is a pleasant 65 y.o. year old female who presents to clinic today with Annual Exam  and follow up of chronic medical conditions on 09/02/2016  HPI:  Saw Candis Musa, RN on 08/22/16 for medicare wellness visit. Note reviewed.  Ovarian cancer- followed by Dr. Fransisca Connors.  Notes reviewed. Was last seen on 07/19/16. Dalsa infusions.  She is feeling better over all.  She does get headaches from this chemotherapy.  DM- control has deteriorated. She is taking Lantus 20 units and Metformin. Also taking ACEI and statin.  Anxiety/depression- symptoms have been under reasonable control with Prozac and buspar.  Has been understandably difficult to not be sad about her cancer prognosis.  Has having more edema as well. Legs are more swollen.  H/o CHF.  She has been told part of it is due to her chemotherapy. Was previously on a diuretic.  Lab Results  Component Value Date   CREATININE 0.85 10/12/2015     Lab Results  Component Value Date   CHOL 149 10/12/2015   HDL 34.30 (L) 10/12/2015   LDLCALC 91 01/09/2015   LDLDIRECT 84.0 10/12/2015   TRIG 205.0 (H) 10/12/2015   CHOLHDL 4 10/12/2015   Lab Results  Component Value Date   HGBA1C 7.9 (H) 08/22/2016   Lab Results  Component Value Date   WBC 2.4 (L) 11/27/2015   HGB 8.2 Repeated and verified X2. (L) 11/27/2015   HCT 24.0 Repeated and verified X2. (L) 11/27/2015   MCV 91.9 11/27/2015   PLT 269.0 11/27/2015    Current Outpatient Prescriptions on File Prior to Visit  Medication Sig Dispense Refill  . albuterol (PROVENTIL HFA;VENTOLIN HFA) 108 (90 BASE) MCG/ACT inhaler Inhale 2 puffs into the lungs every 6 (six) hours as needed for wheezing or shortness of breath. 1 Inhaler 0  . Blood Glucose Monitoring Suppl (ONE TOUCH ULTRA 2) w/Device KIT Check blood sugar three times a day and as directed. Dx E11.319 1 each 0    . busPIRone (BUSPAR) 10 MG tablet TAKE ONE TABLET BY MOUTH AT BEDTIME 90 tablet 0  . cycloSPORINE (RESTASIS) 0.05 % ophthalmic emulsion Place 1 drop into both eyes 2 (two) times daily.    Marland Kitchen FLUoxetine (PROZAC) 20 MG capsule TAKE THREE CAPSULES BY MOUTH ONCE DAILY 90 capsule 2  . FLUoxetine (PROZAC) 20 MG capsule TAKE THREE CAPSULES BY MOUTH ONCE DAILY *OFFICE VISIT REQUIRED FOR ADDITIONAL REFILLS* 90 capsule 0  . glucose blood (ONE TOUCH ULTRA TEST) test strip Check blood sugar three times a day and as directed. Dx E11.319 100 each 5  . glyBURIDE (DIABETA) 5 MG tablet TAKE ONE TABLET BY MOUTH TWICE DAILY WITH MEALS 180 tablet 1  . Insulin Glargine (LANTUS SOLOSTAR) 100 UNIT/ML Solostar Pen Inject 15 Units into the skin daily at 10 pm. 10 mL PRN  . lisinopril (PRINIVIL,ZESTRIL) 10 MG tablet TAKE ONE TABLET BY MOUTH ONCE DAILY 30 tablet 5  . lovastatin (MEVACOR) 20 MG tablet TAKE ONE TABLET BY MOUTH AT BEDTIME 30 tablet 2  . metFORMIN (GLUCOPHAGE) 500 MG tablet TAKE ONE TABLET BY MOUTH TWICE DAILY WITH  A  MEAL 180 tablet 5  . ondansetron (ZOFRAN) 8 MG tablet Take 1 tablet (8 mg total) by mouth every 8 (eight) hours as needed for nausea or vomiting. Will not make drowsy. 30 tablet 1  . [place] LANCETS FINE  MISC Check blood sugar three times a day and as directed. Dx E11.319 100 each 5  . ranitidine (ZANTAC) 150 MG tablet TAKE ONE TABLET BY MOUTH TWICE DAILY 180 tablet 2  . zoster vaccine live, PF, (ZOSTAVAX) 98338 UNT/0.65ML injection Inject 19,400 Units into the skin once. 1 each 0   No current facility-administered medications on file prior to visit.     Allergies  Allergen Reactions  . Aspirin Other (See Comments)    asthma  . Lorazepam Shortness Of Breath  . Codeine Nausea And Vomiting    Past Medical History:  Diagnosis Date  . Acute upper respiratory infections of unspecified site   . Anemia, unspecified   . Anxiety state, unspecified   . Cellulitis   . Chronic diastolic  heart failure ([place])   . Congestive heart failure, unspecified   . Contact dermatitis and other eczema, due to unspecified cause   . Depressive disorder, not elsewhere classified   . Diabetes mellitus   . Essential hypertension, benign   . Mastoiditis   . Other and unspecified hyperlipidemia   . Other diseases of lung, not elsewhere classified   . Other dyspnea and respiratory abnormality   . Recent retinal detachment, partial, with giant tear   . Type II or unspecified type diabetes mellitus with peripheral circulatory disorders, not stated as uncontrolled(250.70)   . Unspecified asthma, with exacerbation   . Unspecified urinary incontinence   . Wheezing     Past Surgical History:  Procedure Laterality Date  . ABDOMINAL HYSTERECTOMY    . DILATION AND CURETTAGE OF UTERUS    . EYE SURGERY     Left retina repair- 11/09, 02-20-09  . KNEE SURGERY  2003   meniscus  . LAPAROSCOPIC GASTRIC BANDING  08/21/10    Family History  Problem Relation Age of Onset  . Cancer Mother     breast  . Cancer Sister     breast  . Cancer Maternal Aunt     breast  . Cancer Maternal Uncle     breast  . Cancer Maternal Grandmother     breast  . Cancer Maternal Uncle     Social History   Social History  . Marital status: Married    Spouse name: N/A  . Number of children: N/A  . Years of education: N/A   Occupational History  . School bus driver Retired   Social History Main Topics  . Smoking status: Never Smoker  . Smokeless tobacco: Never Used  . Alcohol use No  . Drug use: No  . Sexual activity: No   Other Topics Concern  . Not on file   Social History Narrative  . No narrative on file   The PMH, PSH, Social History, Family History, Medications, and allergies have been reviewed in [facility], and have been updated if relevant.   Review of Systems  Constitutional: Negative.   HENT: Negative.   Eyes: Negative.   Respiratory: Positive for shortness of breath.   Cardiovascular:  Positive for leg swelling. Negative for chest pain.  Endocrine: Negative.   Genitourinary: Negative.   Skin: Negative.   Allergic/Immunologic: Negative.   Neurological: Negative.   Hematological: Negative.   Psychiatric/Behavioral: Negative.   All other systems reviewed and are negative.      Objective:    BP 138/72   Pulse 79   Temp 98 F (36.7 C) (Oral)   Ht 5' 5.25" (1.657 m)   Wt 245 lb 12 oz (111.5 kg)  SpO2 97%   BMI 40.58 kg/m    Physical Exam  Constitutional: She appears well-developed and well-nourished. No distress.  HENT:  Head: Normocephalic and atraumatic.  Eyes: Conjunctivae are normal.  Neck: Normal range of motion.  Cardiovascular: Normal rate and regular rhythm.   Pulmonary/Chest: Effort normal and breath sounds normal. She has no wheezes.  Abdominal: She exhibits distension. She exhibits no mass. There is no tenderness. There is no rebound and no guarding.  Musculoskeletal: She exhibits edema.  Skin: Skin is warm and dry. She is not diaphoretic.  Psychiatric: She has a normal mood and affect. Her behavior is normal. Judgment and thought content normal.  Nursing note and vitals reviewed.         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

## 2016-09-02 NOTE — Assessment & Plan Note (Signed)
Deteriorated. Increased Lantus to 25 units. Check a1c.

## 2016-09-02 NOTE — Telephone Encounter (Signed)
Done on med chart 07/18/16.

## 2016-09-02 NOTE — Assessment & Plan Note (Signed)
Reviewed preventive care protocols, scheduled due services, and updated immunizations Discussed nutrition, exercise, diet, and healthy lifestyle.  

## 2016-09-02 NOTE — Assessment & Plan Note (Signed)
Followed by oncology. She is not in any current pain.

## 2016-09-04 ENCOUNTER — Other Ambulatory Visit: Payer: Self-pay | Admitting: Family Medicine

## 2016-09-04 MED ORDER — FUROSEMIDE 20 MG PO TABS: 20.0000 mg | ORAL_TABLET | Freq: Every day | ORAL | 0 refills | Status: DC

## 2016-09-10 ENCOUNTER — Other Ambulatory Visit: Payer: Self-pay

## 2016-09-10 MED ORDER — FUROSEMIDE 20 MG PO TABS
20.0000 mg | ORAL_TABLET | Freq: Every day | ORAL | 0 refills | Status: DC
Start: 2016-09-10 — End: 2016-12-10

## 2016-09-10 NOTE — Telephone Encounter (Signed)
Pt called saying her fluid pill was never sent to the pharmacy last week. I found that it had been sent to [facility]. Advised pt I would send it in for her to the correct pharmacy

## 2016-09-18 ENCOUNTER — Other Ambulatory Visit: Payer: Self-pay | Admitting: Family Medicine

## 2016-10-01 ENCOUNTER — Other Ambulatory Visit: Payer: Self-pay | Admitting: Family Medicine

## 2016-10-20 ENCOUNTER — Other Ambulatory Visit: Payer: Self-pay | Admitting: Family Medicine

## 2016-11-03 ENCOUNTER — Other Ambulatory Visit: Payer: Self-pay | Admitting: Family Medicine

## 2016-12-10 ENCOUNTER — Other Ambulatory Visit: Payer: Self-pay | Admitting: Family Medicine

## 2016-12-24 ENCOUNTER — Other Ambulatory Visit: Payer: Self-pay | Admitting: Family Medicine

## 2017-02-22 ENCOUNTER — Other Ambulatory Visit: Payer: Self-pay | Admitting: Family Medicine

## 2017-02-24 NOTE — Telephone Encounter (Signed)
Last refill 08/19/16 #90 Last OV 09/02/16 Ok to refill?

## 2017-03-18 ENCOUNTER — Ambulatory Visit: Payer: Medicare Other | Admitting: Family Medicine

## 2017-03-25 ENCOUNTER — Encounter: Payer: Self-pay | Admitting: Family Medicine

## 2017-03-25 ENCOUNTER — Ambulatory Visit (INDEPENDENT_AMBULATORY_CARE_PROVIDER_SITE_OTHER): Payer: Medicare Other | Admitting: Family Medicine

## 2017-03-25 VITALS — BP 162/74 | HR 91 | Ht 65.5 in | Wt 246.0 lb

## 2017-03-25 DIAGNOSIS — I10 Essential (primary) hypertension: Secondary | ICD-10-CM | POA: Diagnosis not present

## 2017-03-25 DIAGNOSIS — C801 Malignant (primary) neoplasm, unspecified: Secondary | ICD-10-CM | POA: Diagnosis not present

## 2017-03-25 DIAGNOSIS — I509 Heart failure, unspecified: Secondary | ICD-10-CM | POA: Diagnosis not present

## 2017-03-25 DIAGNOSIS — C569 Malignant neoplasm of unspecified ovary: Secondary | ICD-10-CM | POA: Diagnosis not present

## 2017-03-25 DIAGNOSIS — C786 Secondary malignant neoplasm of retroperitoneum and peritoneum: Secondary | ICD-10-CM

## 2017-03-25 LAB — COMPREHENSIVE METABOLIC PANEL
ALT: 13 U/L (ref 0–35)
AST: 13 U/L (ref 0–37)
Albumin: 3.7 g/dL (ref 3.5–5.2)
Alkaline Phosphatase: 64 U/L (ref 39–117)
BUN: 18 mg/dL (ref 6–23)
CHLORIDE: 102 meq/L (ref 96–112)
CO2: 26 meq/L (ref 19–32)
Calcium: 9.3 mg/dL (ref 8.4–10.5)
Creatinine, Ser: 1.16 mg/dL (ref 0.40–1.20)
GFR: 49.4 mL/min — ABNORMAL LOW (ref >60.00)
GLUCOSE: 136 mg/dL — AB (ref 70–99)
POTASSIUM: 4.6 meq/L (ref 3.5–5.1)
SODIUM: 137 meq/L (ref 135–145)
TOTAL PROTEIN: 6.8 g/dL (ref 6.0–8.3)
Total Bilirubin: 0.3 mg/dL (ref 0.2–1.2)

## 2017-03-25 LAB — BRAIN NATRIURETIC PEPTIDE: PRO B NATRI PEPTIDE: 93 pg/mL (ref 0.0–100.0)

## 2017-03-25 NOTE — Progress Notes (Signed)
Subjective:   Patient ID: [name], female    DOB: October 26, 1950, 66 y.o.   MRN: [id number]  [name] is a pleasant 66 y.o. year old female with h/o late stage ovarian cancer,  who presents to clinic today with Hospitalization Follow-up  on 03/25/2017  HPI:  Martin Majestic to [facility] on 03/17/17 for progressive weakness and SOB over the past 3 weeks.  Notes reviewed.  CXR neg. BNP and BP were elevated. EKG and CE enzymes neg. 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  Advised to continue home lasix.  Since BP was elevated, lisinopril dose was increased to 20 mg daily and amlodipine 5 mg daily was added.   Imaging Review:  CXR PA and Lateral 7/2 - wet read Findings: Cardiomediastinal silhouette is normal in contour. No pleural effusion or pneumothorax. No acute pleural-parenchymal abnormality. Multilevel degenerative changes of the thoracic spine. Visualized abdomen is unremarkable.  Impression:  No acute cardiopulmonary abnormality.  CT PE 7/2 - wet read -No PE -Trace right pleural effusion and scattered subsegmental atelectasis -Mild diffuse bronchial wall thickening, which can be seen in the setting of small airways disease.  Today SOB has improved.  Still has DOE.  Edema has improved, still taking home dose of lasix.  Current Outpatient Prescriptions on File Prior to Visit  Medication Sig Dispense Refill  . albuterol (PROVENTIL HFA;VENTOLIN HFA) 108 (90 BASE) MCG/ACT inhaler Inhale 2 puffs into the lungs every 6 (six) hours as needed for wheezing or shortness of breath. 1 Inhaler 0  . Blood Glucose Monitoring Suppl (ONE TOUCH ULTRA 2) w/Device KIT Check blood sugar three times a day and as directed. Dx E11.319 1 each 0  . busPIRone (BUSPAR) 10 MG tablet TAKE ONE TABLET BY MOUTH AT BEDTIME 90 tablet 0  . cycloSPORINE (RESTASIS) 0.05 % ophthalmic emulsion Place 1 drop into both eyes 2 (two) times daily.    Marland Kitchen FLUoxetine (PROZAC) 20 MG capsule TAKE THREE  CAPSULES BY MOUTH ONCE DAILY 90 capsule 2  . FLUoxetine (PROZAC) 20 MG capsule TAKE THREE CAPSULES BY MOUTH ONCE DAILY 90 capsule 2  . furosemide (LASIX) 20 MG tablet TAKE ONE TABLET BY MOUTH IN THE MORNING 90 tablet 1  . glucose blood (ONE TOUCH ULTRA TEST) test strip Check blood sugar three times a day and as directed. Dx E11.319 100 each 5  . glyBURIDE (DIABETA) 5 MG tablet TAKE ONE TABLET BY MOUTH TWICE DAILY WITH MEALS 180 tablet 1  . Insulin Glargine (LANTUS SOLOSTAR) 100 UNIT/ML Solostar Pen Inject 20 Units into the skin daily at 10 pm. 10 mL 6  . lisinopril (PRINIVIL,ZESTRIL) 10 MG tablet TAKE ONE TABLET BY MOUTH ONCE DAILY 30 tablet 5  . lovastatin (MEVACOR) 20 MG tablet TAKE ONE TABLET BY MOUTH AT BEDTIME 90 tablet 1  . metFORMIN (GLUCOPHAGE) 500 MG tablet TAKE ONE TABLET BY MOUTH TWICE DAILY WITH  A  MEAL 180 tablet 5  . ondansetron (ZOFRAN) 8 MG tablet Take 1 tablet (8 mg total) by mouth every 8 (eight) hours as needed for nausea or vomiting. Will not make drowsy. 30 tablet 1  . ONETOUCH DELICA LANCETS FINE MISC Check blood sugar three times a day and as directed. Dx E11.319 100 each 5  . ranitidine (ZANTAC) 150 MG tablet TAKE ONE TABLET BY MOUTH TWICE DAILY 180 tablet 2  . zoster vaccine live, PF, (ZOSTAVAX) 43154 UNT/0.65ML injection Inject 19,400 Units into the skin once. 1 each 0  No current facility-administered medications on file prior to visit.     Allergies  Allergen Reactions  . Aspirin Other (See Comments)    asthma  . Lorazepam Shortness Of Breath  . Codeine Nausea And Vomiting    Past Medical History:  Diagnosis Date  . Acute upper respiratory infections of unspecified site   . Anemia, unspecified   . Anxiety state, unspecified   . Cellulitis   . Chronic diastolic heart failure ([place])   . Congestive heart failure, unspecified   . Contact dermatitis and other eczema, due to unspecified cause   . Depressive disorder, not elsewhere classified   . Diabetes  mellitus   . Essential hypertension, benign   . Mastoiditis   . Other and unspecified hyperlipidemia   . Other diseases of lung, not elsewhere classified   . Other dyspnea and respiratory abnormality   . Recent retinal detachment, partial, with giant tear   . Type II or unspecified type diabetes mellitus with peripheral circulatory disorders, not stated as uncontrolled(250.70)   . Unspecified asthma, with exacerbation   . Unspecified urinary incontinence   . Wheezing     Past Surgical History:  Procedure Laterality Date  . ABDOMINAL HYSTERECTOMY    . DILATION AND CURETTAGE OF UTERUS    . EYE SURGERY     Left retina repair- 11/09, 02-20-09  . KNEE SURGERY  2003   meniscus  . LAPAROSCOPIC GASTRIC BANDING  08/21/10    Family History  Problem Relation Age of Onset  . Cancer Mother        breast  . Cancer Sister        breast  . Cancer Maternal Aunt        breast  . Cancer Maternal Uncle        breast  . Cancer Maternal Grandmother        breast  . Cancer Maternal Uncle     Social History   Social History  . Marital status: Married    Spouse name: N/A  . Number of children: N/A  . Years of education: N/A   Occupational History  . School bus driver Retired   Social History Main Topics  . Smoking status: Never Smoker  . Smokeless tobacco: Never Used  . Alcohol use No  . Drug use: No  . Sexual activity: No   Other Topics Concern  . Not on file   Social History Narrative  . No narrative on file   The PMH, PSH, Social History, Family History, Medications, and allergies have been reviewed in [facility], and have been updated if relevant.     Review of Systems  Constitutional: Positive for fatigue.  HENT: Negative.   Eyes: Negative.   Respiratory: Positive for shortness of breath.   Cardiovascular: Positive for leg swelling. Negative for chest pain.  Gastrointestinal: Negative.   Endocrine: Negative.   Genitourinary: Negative.   Musculoskeletal: Positive for  back pain.  Allergic/Immunologic: Negative.   Neurological: Negative.   Hematological: Negative.   Psychiatric/Behavioral: Negative.   All other systems reviewed and are negative.      Objective:    BP (!) 162/74   Pulse 91   Ht 5' 5.5" (1.664 m)   Wt 246 lb (111.6 kg)   SpO2 97%   BMI 40.31 kg/m   Wt Readings from Last 3 Encounters:  03/25/17 246 lb (111.6 kg)  09/02/16 245 lb 12 oz (111.5 kg)  08/22/16 241 lb 12 oz (109.7 kg)  Physical Exam  Constitutional: She is oriented to person, place, and time. She appears well-developed and well-nourished. No distress.  HENT:  Head: Normocephalic and atraumatic.  Eyes: Conjunctivae are normal.  Cardiovascular: Normal rate.   Pulmonary/Chest: Effort normal.  Musculoskeletal: She exhibits edema.  Neurological: She is alert and oriented to person, place, and time. No cranial nerve deficit.  Skin: Skin is warm and dry. She is not diaphoretic.  Psychiatric: She has a normal mood and affect. Her behavior is normal. Judgment and thought content normal.  Nursing note and vitals reviewed.         Assessment & Plan:   Malignant neoplasm of ovary, unspecified laterality (HCC)  Peritoneal carcinomatosis (HCC)  Congestive heart failure, unspecified HF chronicity, unspecified heart failure type ([place])  HYPERTENSION, BENIGN No Follow-up on file.

## 2017-03-25 NOTE — Assessment & Plan Note (Signed)
Improving. She is checking this at home daily. No changes made to rxs today.

## 2017-03-25 NOTE — Assessment & Plan Note (Signed)
Has appointment with oncology later this week. Per pt, may change tx plan given CHF side effects. She does ask that I check her CA125 today and fax results to her oncologist.

## 2017-03-25 NOTE — Assessment & Plan Note (Signed)
Less signs of volume overload on exam today. Will check BNP and CMET. Continue current rxs. The patient indicates understanding of these issues and agrees with the plan.

## 2017-03-25 NOTE — Patient Instructions (Signed)
Great to see you.  We will call you with your results. 

## 2017-03-26 ENCOUNTER — Encounter (HOSPITAL_COMMUNITY): Payer: Self-pay

## 2017-03-26 LAB — CA 125: CA 125: 200 U/mL — AB (ref <35)

## 2017-04-01 ENCOUNTER — Other Ambulatory Visit: Payer: Self-pay | Admitting: Family Medicine

## 2017-04-01 NOTE — Telephone Encounter (Signed)
Last refill 12/24/16 #90 +2  Last OV 09/02/16 Ok to refill?

## 2017-04-14 ENCOUNTER — Telehealth: Payer: Self-pay | Admitting: *Deleted

## 2017-04-14 MED ORDER — GLIPIZIDE 5 MG PO TABS: 5.0000 mg | ORAL_TABLET | Freq: Two times a day (BID) | ORAL | 3 refills | Status: DC

## 2017-04-14 NOTE — Telephone Encounter (Signed)
Received fax from [facility] stating Glyburide 5 mg will no longer by on their discount list.  The alternatives are glipizide and glimepiride.  Please advise.

## 2017-04-14 NOTE — Telephone Encounter (Signed)
eRx sent for glipizide.

## 2017-04-15 ENCOUNTER — Telehealth: Payer: Self-pay

## 2017-04-15 ENCOUNTER — Telehealth: Payer: Self-pay | Admitting: Family Medicine

## 2017-04-15 DIAGNOSIS — I509 Heart failure, unspecified: Secondary | ICD-10-CM

## 2017-04-15 DIAGNOSIS — C569 Malignant neoplasm of unspecified ovary: Secondary | ICD-10-CM

## 2017-04-15 MED ORDER — ALBUTEROL SULFATE HFA 108 (90 BASE) MCG/ACT IN AERS: 2.0000 | INHALATION_SPRAY | Freq: Four times a day (QID) | RESPIRATORY_TRACT | 0 refills | Status: DC | PRN

## 2017-04-15 NOTE — Telephone Encounter (Signed)
Albuterol eRx sent but please advice pt that albuterol can actually cause racing/rapid heart rate as a side effect.  Please keep [place] updated with how she is feeling.

## 2017-04-15 NOTE — Telephone Encounter (Signed)
Pt was recently discharged from [place] with fast heart beat; pt has appt to see new card in [place] 05/16/17; pt has [facility] f/u appt with Dr Deborra Medina on 04/17/17. On 04/14/17 heart rate was 88 and 114. On  04/15/17 heart rate 123. Pt thinks heart rate increasing may be due to a non prod cough with on and off SOB and wheezing upon exertion; No fever. Pt request refill albuterol inhaler to [facility] [address]. Last filled albuterol inhaler # 1 on 04/21/15 by Dr Marko Plume. Pt request cb. Pt did not want to schedule appt at this time and said could not call doctor at [facility] because he was like hospitalist and the card in [place] has never seen pt.Please advise.

## 2017-04-15 NOTE — Telephone Encounter (Signed)
Referral placed.

## 2017-04-15 NOTE — Telephone Encounter (Signed)
Yes okay to change as requested.

## 2017-04-15 NOTE — Telephone Encounter (Signed)
Husband called - he is requesting a referral to cardiologist, Dr Lovena Le.  Please call husband back at [phone] They would like call back as soon as possible.

## 2017-04-15 NOTE — Telephone Encounter (Signed)
I am not saying it will cause her heart to race for sure but I did want her to be aware that she may have those symptoms. Has it caused palpitations for her in the past?

## 2017-04-15 NOTE — Telephone Encounter (Signed)
Receive a fax from pt's pharmacy, insurance will not cover albuterol but it will cover proair ok to change?

## 2017-04-15 NOTE — Telephone Encounter (Signed)
She said it hasn't caused any palpitations in the past.

## 2017-04-15 NOTE — Telephone Encounter (Signed)
Error

## 2017-04-15 NOTE — Telephone Encounter (Signed)
She wants to know if she can have something else that won't increase her heart rate, I offered an appt for tomorrow she said she already has an appt with you on Thursday.

## 2017-04-16 ENCOUNTER — Other Ambulatory Visit: Payer: Self-pay

## 2017-04-16 MED ORDER — ALBUTEROL SULFATE HFA 108 (90 BASE) MCG/ACT IN AERS: 2.0000 | INHALATION_SPRAY | Freq: Four times a day (QID) | RESPIRATORY_TRACT | 0 refills | Status: DC | PRN

## 2017-04-17 ENCOUNTER — Ambulatory Visit (INDEPENDENT_AMBULATORY_CARE_PROVIDER_SITE_OTHER)
Admission: RE | Admit: 2017-04-17 | Discharge: 2017-04-17 | Disposition: A | Discharge status: Home / Self Care | Payer: Medicare Other | Source: Ambulatory Visit | Attending: Family Medicine | Admitting: Family Medicine

## 2017-04-17 ENCOUNTER — Telehealth: Payer: Self-pay | Admitting: Family Medicine

## 2017-04-17 ENCOUNTER — Ambulatory Visit (INDEPENDENT_AMBULATORY_CARE_PROVIDER_SITE_OTHER): Payer: Medicare Other | Admitting: Family Medicine

## 2017-04-17 VITALS — BP 144/70 | HR 87 | Ht 65.5 in | Wt 239.0 lb

## 2017-04-17 DIAGNOSIS — I4892 Unspecified atrial flutter: Secondary | ICD-10-CM | POA: Diagnosis not present

## 2017-04-17 DIAGNOSIS — I509 Heart failure, unspecified: Secondary | ICD-10-CM

## 2017-04-17 DIAGNOSIS — C569 Malignant neoplasm of unspecified ovary: Secondary | ICD-10-CM

## 2017-04-17 LAB — COMPREHENSIVE METABOLIC PANEL
ALT: 13 U/L (ref 0–35)
AST: 13 U/L (ref 0–37)
Albumin: 3.7 g/dL (ref 3.5–5.2)
Alkaline Phosphatase: 84 U/L (ref 39–117)
BILIRUBIN TOTAL: 0.4 mg/dL (ref 0.2–1.2)
BUN: 24 mg/dL — ABNORMAL HIGH (ref 6–23)
CALCIUM: 9.8 mg/dL (ref 8.4–10.5)
CHLORIDE: 99 meq/L (ref 96–112)
CO2: 27 meq/L (ref 19–32)
Creatinine, Ser: 1.39 mg/dL — ABNORMAL HIGH (ref 0.40–1.20)
GFR: 40.09 mL/min — AB (ref >60.00)
GLUCOSE: 151 mg/dL — AB (ref 70–99)
POTASSIUM: 4.7 meq/L (ref 3.5–5.1)
Sodium: 134 mEq/L — ABNORMAL LOW (ref 135–145)
Total Protein: 7.4 g/dL (ref 6.0–8.3)

## 2017-04-17 LAB — CBC WITH DIFFERENTIAL/PLATELET
BASOS ABS: 0 10*3/uL (ref 0.0–0.1)
Basophils Relative: 0.7 % (ref 0.0–3.0)
EOS PCT: 0.9 % (ref 0.0–5.0)
Eosinophils Absolute: 0.1 10*3/uL (ref 0.0–0.7)
HEMATOCRIT: 30.7 % — AB (ref 36.0–46.0)
Hemoglobin: 9.9 g/dL — ABNORMAL LOW (ref 12.0–15.0)
LYMPHS ABS: 0.6 10*3/uL — AB (ref 0.7–4.0)
LYMPHS PCT: 8.6 % — AB (ref 12.0–46.0)
MCHC: 32.4 g/dL (ref 30.0–36.0)
MCV: 85.3 fl (ref 78.0–100.0)
MONOS PCT: 5.5 % (ref 3.0–12.0)
Monocytes Absolute: 0.4 10*3/uL (ref 0.1–1.0)
NEUTROS ABS: 6.2 10*3/uL (ref 1.4–7.7)
Neutrophils Relative %: 84.3 % — ABNORMAL HIGH (ref 43.0–77.0)
PLATELETS: 307 10*3/uL (ref 150.0–400.0)
RBC: 3.59 Mil/uL — AB (ref 3.87–5.11)
RDW: 17 % — ABNORMAL HIGH (ref 11.5–15.5)
WBC: 7.3 10*3/uL (ref 4.0–10.5)

## 2017-04-17 LAB — BRAIN NATRIURETIC PEPTIDE: PRO B NATRI PEPTIDE: 134 pg/mL — AB (ref 0.0–100.0)

## 2017-04-17 NOTE — Telephone Encounter (Signed)
Patient said someone called her but she couldn't get to the telephone.  I looked at the phone notes and there are three of them, so I couldn't track down who called her.  She said she will be at home.

## 2017-04-17 NOTE — Assessment & Plan Note (Signed)
Repeat BNP today. CXR as well given wheezes on exam. The patient indicates understanding of these issues and agrees with the plan.

## 2017-04-17 NOTE — Assessment & Plan Note (Signed)
Rate and rhythm controlled on diltiazem and amiodarone. Eliquis for anticoagulation. Has appt with cardiology at the end of the month.

## 2017-04-17 NOTE — Progress Notes (Signed)
Subjective:   Patient ID: [name], female    DOB: 17-Jun-1951, 66 y.o.   MRN: [id number]  [name] is a pleasant 66 y.o. year old female who presents to clinic today with Hospitalization Follow-up  on 04/17/2017  HPI:  Admitted to [facility] 04/06/17- 04/11/17 after she presented with chest pain.  Notes reviewed.  Several days of palpitations and mid chest pain prior to presenting to the ER. In ER, troponins were negative but BNP was elevated to 2919. EKG neg for acute ischemic changes but she was tachycardic in the 140s.  Started in dilt gtt.  CT angio- no PE ETT- grossly normal LV and RV function.  Diagnosed with new onset a flutter/afib with RVR- transitioned to oral regiement.  Sent home on oral diltiazem, amiodarone and apixaban.  Advised to continue po lasix.  Advised to schedule stress test as outpatient.  Today, she is very short of breath.  Feels she can't walk a few steps without wheezing.  Lab Results  Component Value Date   CREATININE 1.16 03/25/2017    ProBNP    Component Value Date/Time   PROBNP 93.0 03/25/2017 1037     Current Outpatient Prescriptions on File Prior to Visit  Medication Sig Dispense Refill  . albuterol (PROVENTIL HFA;VENTOLIN HFA) 108 (90 Base) MCG/ACT inhaler Inhale 2 puffs into the lungs every 6 (six) hours as needed for wheezing or shortness of breath. 1 Inhaler 0  . albuterol (PROVENTIL HFA;VENTOLIN HFA) 108 (90 Base) MCG/ACT inhaler Inhale 2 puffs into the lungs every 6 (six) hours as needed for wheezing or shortness of breath. OKAY TO DISPENSE ProAir 1 Inhaler 0  . Blood Glucose Monitoring Suppl (ONE TOUCH ULTRA 2) w/Device KIT Check blood sugar three times a day and as directed. Dx E11.319 1 each 0  . busPIRone (BUSPAR) 10 MG tablet TAKE ONE TABLET BY MOUTH AT BEDTIME 90 tablet 0  . cycloSPORINE (RESTASIS) 0.05 % ophthalmic emulsion Place 1 drop into both eyes 2 (two) times daily.    . furosemide (LASIX) 20 MG tablet TAKE ONE  TABLET BY MOUTH IN THE MORNING 90 tablet 1  . glipiZIDE (GLUCOTROL) 5 MG tablet Take 1 tablet (5 mg total) by mouth 2 (two) times daily before a meal. 60 tablet 3  . glucose blood (ONE TOUCH ULTRA TEST) test strip Check blood sugar three times a day and as directed. Dx E11.319 100 each 5  . Insulin Glargine (LANTUS SOLOSTAR) 100 UNIT/ML Solostar Pen Inject 20 Units into the skin daily at 10 pm. 10 mL 6  . ondansetron (ZOFRAN) 8 MG tablet Take 1 tablet (8 mg total) by mouth every 8 (eight) hours as needed for nausea or vomiting. Will not make drowsy. 30 tablet 1  . ONETOUCH DELICA LANCETS FINE MISC Check blood sugar three times a day and as directed. Dx E11.319 100 each 5  . oxyCODONE (OXY IR/ROXICODONE) 5 MG immediate release tablet Take 5 mg by mouth every 4 (four) hours as needed.    . ranitidine (ZANTAC) 150 MG tablet TAKE ONE TABLET BY MOUTH TWICE DAILY 180 tablet 2  . amLODipine (NORVASC) 5 MG tablet Take 5 mg by mouth daily.    Marland Kitchen FLUoxetine (PROZAC) 20 MG capsule TAKE THREE CAPSULES BY MOUTH ONCE DAILY (Patient not taking: Reported on 04/17/2017) 90 capsule 2  . FLUoxetine (PROZAC) 20 MG capsule TAKE 3 CAPSULES BY MOUTH ONCE DAILY (Patient not taking: Reported on 04/17/2017) 90 capsule 2  . lisinopril (PRINIVIL,ZESTRIL) 10  MG tablet TAKE ONE TABLET BY MOUTH ONCE DAILY (Patient not taking: Reported on 04/17/2017) 30 tablet 5  . lovastatin (MEVACOR) 20 MG tablet TAKE ONE TABLET BY MOUTH AT BEDTIME (Patient not taking: Reported on 04/17/2017) 90 tablet 1  . metFORMIN (GLUCOPHAGE) 500 MG tablet TAKE ONE TABLET BY MOUTH TWICE DAILY WITH  A  MEAL (Patient not taking: Reported on 04/17/2017) 180 tablet 5  . zoster vaccine live, PF, (ZOSTAVAX) 84132 UNT/0.65ML injection Inject 19,400 Units into the skin once. (Patient not taking: Reported on 04/17/2017) 1 each 0   No current facility-administered medications on file prior to visit.     Allergies  Allergen Reactions  . Aspirin Other (See Comments)    asthma    . Lorazepam Shortness Of Breath  . Codeine Nausea And Vomiting    Past Medical History:  Diagnosis Date  . Acute upper respiratory infections of unspecified site   . Anemia, unspecified   . Anxiety state, unspecified   . Cellulitis   . Chronic diastolic heart failure ([place])   . Congestive heart failure, unspecified   . Contact dermatitis and other eczema, due to unspecified cause   . Depressive disorder, not elsewhere classified   . Diabetes mellitus   . Essential hypertension, benign   . Mastoiditis   . Other and unspecified hyperlipidemia   . Other diseases of lung, not elsewhere classified   . Other dyspnea and respiratory abnormality   . Recent retinal detachment, partial, with giant tear   . Type II or unspecified type diabetes mellitus with peripheral circulatory disorders, not stated as uncontrolled(250.70)   . Unspecified asthma, with exacerbation   . Unspecified urinary incontinence   . Wheezing     Past Surgical History:  Procedure Laterality Date  . ABDOMINAL HYSTERECTOMY    . DILATION AND CURETTAGE OF UTERUS    . EYE SURGERY     Left retina repair- 11/09, 02-20-09  . KNEE SURGERY  2003   meniscus  . LAPAROSCOPIC GASTRIC BANDING  08/21/10    Family History  Problem Relation Age of Onset  . Cancer Mother        breast  . Cancer Sister        breast  . Cancer Maternal Aunt        breast  . Cancer Maternal Uncle        breast  . Cancer Maternal Grandmother        breast  . Cancer Maternal Uncle     Social History   Social History  . Marital status: Married    Spouse name: N/A  . Number of children: N/A  . Years of education: N/A   Occupational History  . School bus driver Retired   Social History Main Topics  . Smoking status: Never Smoker  . Smokeless tobacco: Never Used  . Alcohol use No  . Drug use: No  . Sexual activity: No   Other Topics Concern  . Not on file   Social History Narrative  . No narrative on file   The PMH, PSH,  Social History, Family History, Medications, and allergies have been reviewed in [facility], and have been updated if relevant.   Review of Systems  Constitutional: Negative.   Respiratory: Positive for shortness of breath and wheezing. Negative for apnea, cough, choking, chest tightness and stridor.   Cardiovascular: Negative.   Neurological: Negative.   Psychiatric/Behavioral: Negative.        Objective:    BP (!) 144/70  Pulse 87   Ht 5' 5.5" (1.664 m)   Wt 239 lb (108.4 kg)   SpO2 95%   BMI 39.17 kg/m   Wt Readings from Last 3 Encounters:  04/17/17 239 lb (108.4 kg)  03/25/17 246 lb (111.6 kg)  09/02/16 245 lb 12 oz (111.5 kg)    Physical Exam  Constitutional: She is oriented to person, place, and time. She appears well-developed and well-nourished. No distress.  HENT:  Head: Normocephalic and atraumatic.  Eyes: Conjunctivae are normal.  Cardiovascular: Normal rate and regular rhythm.   Pulmonary/Chest: She has decreased breath sounds in the right lower field and the left lower field. She has wheezes in the right middle field, the right lower field, the left upper field, the left middle field and the left lower field. She has rhonchi in the right lower field and the left upper field.  Musculoskeletal: Normal range of motion.  Neurological: She is alert and oriented to person, place, and time. No cranial nerve deficit.  Skin: Skin is warm and dry. She is not diaphoretic.  Psychiatric: She has a normal mood and affect. Her behavior is normal. Judgment and thought content normal.  Nursing note and vitals reviewed.         Assessment & Plan:   Malignant neoplasm of ovary, unspecified laterality (HCC)  Congestive heart failure, unspecified HF chronicity, unspecified heart failure type ([place])  Atrial flutter, unspecified type ([place]) No Follow-up on file.

## 2017-04-17 NOTE — Telephone Encounter (Signed)
Spoke with pt, please refer to imaging results.

## 2017-04-23 ENCOUNTER — Telehealth: Payer: Self-pay | Admitting: Family Medicine

## 2017-04-23 NOTE — Telephone Encounter (Signed)
Patient said she just received a message to call the office back.  Did you call patient or do you know who might have called patient?

## 2017-04-24 NOTE — Telephone Encounter (Signed)
She might just want her lab results.

## 2017-04-24 NOTE — Telephone Encounter (Signed)
Please let pt know that her labs were reassuring- heart failure lab (BNP) much improved since her hospitalization and her other labs were relatively stable.

## 2017-04-24 NOTE — Telephone Encounter (Signed)
Left message to return call 

## 2017-04-25 NOTE — Telephone Encounter (Signed)
Spoke to pt and advised her of results.

## 2017-04-29 ENCOUNTER — Other Ambulatory Visit: Payer: Self-pay | Admitting: Family Medicine

## 2017-05-05 ENCOUNTER — Ambulatory Visit: Payer: Medicare Other | Admitting: Physician Assistant

## 2017-05-06 ENCOUNTER — Other Ambulatory Visit: Payer: Self-pay | Admitting: Family Medicine

## 2017-05-26 ENCOUNTER — Other Ambulatory Visit: Payer: Self-pay | Admitting: Family Medicine

## 2017-05-26 NOTE — Telephone Encounter (Signed)
Last refill 02/24/17 Last OV 04/17/17 Ok to refill?

## 2017-06-18 ENCOUNTER — Other Ambulatory Visit: Payer: Self-pay | Admitting: Family Medicine

## 2017-06-27 ENCOUNTER — Other Ambulatory Visit: Payer: Self-pay | Admitting: Family Medicine

## 2017-06-30 NOTE — Telephone Encounter (Signed)
Note to pharm to Plz advise pt to contact to discuss how she is taking this/refill req for 15 units chart says 20 units and [facility] says 30 units for reconcilliation/thx dmf

## 2017-07-01 ENCOUNTER — Other Ambulatory Visit: Payer: Self-pay | Admitting: Family Medicine

## 2017-07-01 NOTE — Telephone Encounter (Signed)
LMOVM stating that I need a RTC and to advise the person answering how many units of the Lantus she is taking daily/the request from the pharm says 15units, chart says 20units, reconcilliation says 30units from [facility]/thx dmf

## 2017-07-01 NOTE — Telephone Encounter (Signed)
Pt left v/m requesting cb about lantus refill.

## 2017-07-02 ENCOUNTER — Ambulatory Visit (INDEPENDENT_AMBULATORY_CARE_PROVIDER_SITE_OTHER): Payer: Medicare Other | Admitting: Family Medicine

## 2017-07-02 ENCOUNTER — Encounter: Payer: Self-pay | Admitting: Family Medicine

## 2017-07-02 VITALS — BP 132/68 | HR 80 | Temp 98.4°F | Ht 65.5 in | Wt 240.1 lb

## 2017-07-02 DIAGNOSIS — E083513 Diabetes mellitus due to underlying condition with proliferative diabetic retinopathy with macular edema, bilateral: Secondary | ICD-10-CM

## 2017-07-02 DIAGNOSIS — E08311 Diabetes mellitus due to underlying condition with unspecified diabetic retinopathy with macular edema: Secondary | ICD-10-CM

## 2017-07-02 LAB — HEMOGLOBIN A1C: Hgb A1c MFr Bld: 8.5 % — ABNORMAL HIGH (ref 4.6–6.5)

## 2017-07-02 MED ORDER — INSULIN GLARGINE 100 UNIT/ML ~~LOC~~ SOLN: 30.0000 [IU] | Freq: Every evening | SUBCUTANEOUS | 2 refills | Status: DC

## 2017-07-02 NOTE — Progress Notes (Signed)
Subjective:   Patient ID: [name], female    DOB: Sep 02, 1951, 66 y.o.   MRN: [id number]  [name] is a pleasant 66 y.o. year old female who presents to clinic today with Diabetes (Patient is here today to F/U with DM.  Her Lantus Solostar had been increased to 30 units daily.)  on 07/02/2017  HPI:  DM-  Lantus increased to 30 units daily by [facility] oncologist. Also taking Metformin 500 mg twice daily and glyburide 5 mg twice daily. Due for a1c.  Just started a new chemotherapy on Monday.  Feels she is tolerating this okay. Having some leg pain.  Current Outpatient Prescriptions on File Prior to Visit  Medication Sig Dispense Refill  . amLODipine (NORVASC) 5 MG tablet Take 5 mg by mouth daily.    Marland Kitchen apixaban (ELIQUIS) 5 MG TABS tablet Take 5 mg by mouth 2 (two) times daily.    Marland Kitchen atorvastatin (LIPITOR) 40 MG tablet Take 40 mg by mouth daily.    . Blood Glucose Monitoring Suppl (ONE TOUCH ULTRA 2) w/Device KIT Check blood sugar three times a day and as directed. Dx E11.319 1 each 0  . busPIRone (BUSPAR) 10 MG tablet TAKE 1 TABLET BY MOUTH AT BEDTIME 90 tablet 0  . cycloSPORINE (RESTASIS) 0.05 % ophthalmic emulsion Place 1 drop into both eyes 2 (two) times daily.    Marland Kitchen FLUoxetine (PROZAC) 20 MG capsule TAKE THREE CAPSULES BY MOUTH ONCE DAILY 90 capsule 2  . furosemide (LASIX) 20 MG tablet TAKE 1 TABLET BY MOUTH IN THE MORNING 90 tablet 1  . glipiZIDE (GLUCOTROL) 5 MG tablet Take 1 tablet (5 mg total) by mouth 2 (two) times daily before a meal. 60 tablet 3  . glucose blood (ONE TOUCH ULTRA TEST) test strip Check blood sugar three times a day and as directed. Dx E11.319 100 each 5  . glyBURIDE (DIABETA) 5 MG tablet TAKE ONE TABLET BY MOUTH TWICE DAILY WITH MEALS 180 tablet 1  . metFORMIN (GLUCOPHAGE) 500 MG tablet TAKE ONE TABLET BY MOUTH TWICE DAILY WITH MEALS 180 tablet 5  . ondansetron (ZOFRAN) 8 MG tablet Take 1 tablet (8 mg total) by mouth every 8 (eight) hours as needed  for nausea or vomiting. Will not make drowsy. 30 tablet 1  . ONETOUCH DELICA LANCETS FINE MISC Check blood sugar three times a day and as directed. Dx E11.319 100 each 5  . oxyCODONE (OXY IR/ROXICODONE) 5 MG immediate release tablet Take 5 mg by mouth every 4 (four) hours as needed.    . ranitidine (ZANTAC) 150 MG tablet TAKE ONE TABLET BY MOUTH TWICE DAILY 180 tablet 2   No current facility-administered medications on file prior to visit.     Allergies  Allergen Reactions  . Aspirin Other (See Comments)    asthma  . Lorazepam Shortness Of Breath  . Codeine Nausea And Vomiting    Past Medical History:  Diagnosis Date  . Acute upper respiratory infections of unspecified site   . Anemia, unspecified   . Anxiety state, unspecified   . Cellulitis   . Chronic diastolic heart failure ([place])   . Congestive heart failure, unspecified   . Contact dermatitis and other eczema, due to unspecified cause   . Depressive disorder, not elsewhere classified   . Diabetes mellitus   . Essential hypertension, benign   . Mastoiditis   . Other and unspecified hyperlipidemia   . Other diseases of lung, not elsewhere classified   .  Other dyspnea and respiratory abnormality   . Recent retinal detachment, partial, with giant tear   . Type II or unspecified type diabetes mellitus with peripheral circulatory disorders, not stated as uncontrolled(250.70)   . Unspecified asthma, with exacerbation   . Unspecified urinary incontinence   . Wheezing     Past Surgical History:  Procedure Laterality Date  . ABDOMINAL HYSTERECTOMY    . DILATION AND CURETTAGE OF UTERUS    . EYE SURGERY     Left retina repair- 11/09, 02-20-09  . KNEE SURGERY  2003   meniscus  . LAPAROSCOPIC GASTRIC BANDING  08/21/10    Family History  Problem Relation Age of Onset  . Cancer Mother        breast  . Cancer Sister        breast  . Cancer Maternal Aunt        breast  . Cancer Maternal Uncle        breast  . Cancer  Maternal Grandmother        breast  . Cancer Maternal Uncle     Social History   Social History  . Marital status: Married    Spouse name: N/A  . Number of children: N/A  . Years of education: N/A   Occupational History  . School bus driver Retired   Social History Main Topics  . Smoking status: Never Smoker  . Smokeless tobacco: Never Used  . Alcohol use No  . Drug use: No  . Sexual activity: No   Other Topics Concern  . Not on file   Social History Narrative  . No narrative on file   The PMH, PSH, Social History, Family History, Medications, and allergies have been reviewed in [facility], and have been updated if relevant.   Review of Systems  Constitutional: Positive for fatigue.  Respiratory: Negative.   Cardiovascular: Negative.   Endocrine: Negative.   Musculoskeletal: Positive for arthralgias.  Neurological: Negative.   Psychiatric/Behavioral: Negative.   All other systems reviewed and are negative.      Objective:    BP 132/68 (BP Location: Left Arm, Patient Position: Sitting, Cuff Size: Large)   Pulse 80   Temp 98.4 F (36.9 C) (Oral)   Ht 5' 5.5" (1.664 m)   Wt 240 lb 1.9 oz (108.9 kg)   SpO2 98%   BMI 39.35 kg/m   Wt Readings from Last 3 Encounters:  07/02/17 240 lb 1.9 oz (108.9 kg)  04/17/17 239 lb (108.4 kg)  03/25/17 246 lb (111.6 kg)     Physical Exam  Constitutional: She is oriented to person, place, and time. She appears well-developed and well-nourished. No distress.  HENT:  Head: Normocephalic and atraumatic.  Eyes: Conjunctivae are normal.  Neck: Normal range of motion.  Cardiovascular: Normal rate.   Pulmonary/Chest: Effort normal and breath sounds normal.  Musculoskeletal: Normal range of motion.  Neurological: She is alert and oriented to person, place, and time. No cranial nerve deficit.  Skin: Skin is warm and dry. She is not diaphoretic.  Psychiatric: She has a normal mood and affect. Her behavior is normal. Judgment and  thought content normal.  Nursing note and vitals reviewed.         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

## 2017-07-02 NOTE — Assessment & Plan Note (Signed)
Continue current rxs. Check a1c today. The patient indicates understanding of these issues and agrees with the plan.

## 2017-07-02 NOTE — Telephone Encounter (Signed)
Discussed with pt/she is taking Lantus 30 units/she agrees to come in at 11:30am for OV with TA for 30 min for DM check/thx dmf

## 2017-07-02 NOTE — Telephone Encounter (Signed)
Pt has OV today/will send in new Rx at appt/thx dmf

## 2017-07-05 ENCOUNTER — Other Ambulatory Visit: Payer: Self-pay | Admitting: Family Medicine

## 2017-07-29 ENCOUNTER — Other Ambulatory Visit: Payer: Self-pay | Admitting: Family Medicine

## 2017-09-03 ENCOUNTER — Other Ambulatory Visit: Payer: Self-pay | Admitting: Family Medicine

## 2017-09-04 ENCOUNTER — Telehealth: Payer: Self-pay | Admitting: Family Medicine

## 2017-09-04 MED ORDER — LISINOPRIL 20 MG PO TABS: 20.0000 mg | ORAL_TABLET | Freq: Every day | ORAL | 2 refills | Status: DC

## 2017-09-04 NOTE — Telephone Encounter (Signed)
Copied from [place]. Topic: Quick Communication - Rx Refill/Question >> Sep 04, 2017 10:05 AM Yvette Rack wrote: Has the patient contacted their pharmacy? Yes.     (Agent: If no, request that the patient contact the pharmacy for the refill.)lisinopril (PRINIVIL,ZESTRIL) 20 MG   Preferred Pharmacy (with phone number or street name): [facility] [address] [place] [phone] (Phone) [phone] (Fax)     Agent: Please be advised that RX refills may take up to 3 business days. We ask that you follow-up with your pharmacy.

## 2017-09-04 NOTE — Telephone Encounter (Signed)
Sent in refill to pharm as requested/thx dmf

## 2017-09-22 ENCOUNTER — Ambulatory Visit: Payer: Self-pay | Admitting: *Deleted

## 2017-09-22 ENCOUNTER — Ambulatory Visit: Payer: Medicare Other | Admitting: Family Medicine

## 2017-09-22 ENCOUNTER — Encounter: Payer: Self-pay | Admitting: Family Medicine

## 2017-09-22 VITALS — BP 134/72 | HR 76 | Temp 98.7°F | Ht 65.5 in | Wt 243.2 lb

## 2017-09-22 DIAGNOSIS — H6012 Cellulitis of left external ear: Secondary | ICD-10-CM | POA: Diagnosis not present

## 2017-09-22 MED ORDER — DOXYCYCLINE HYCLATE 100 MG PO TABS: 100.0000 mg | ORAL_TABLET | Freq: Two times a day (BID) | ORAL | 0 refills | Status: DC

## 2017-09-22 NOTE — Patient Instructions (Signed)
Great to see you.  Happy New Year!  Please take doxycyline as directed-  1 tablet twice daily for 10 days.

## 2017-09-22 NOTE — Progress Notes (Signed)
Subjective:   Patient ID: [name], female    DOB: [birth year], 67 y.o.   MRN: [id number]  [name] is a pleasant 67 y.o. year old female who presents to clinic today with Ear Problem (Patient is here today C/O possible cellulitis of AD.  Woke up in the middle of the night thinking she slept on it wrong.  This am it was hot and feels like it is tight.)  on 09/22/2017  HPI:  Woke up today with what she thinks may be cellulitis of her left ear.  Has been treated for this problem before. Very painful and swollen and red.  No fever.  No n/v.  She is currently receiving chemotherapy for ovarian cancer.   Current Outpatient Medications on File Prior to Visit  Medication Sig Dispense Refill  . amiodarone (PACERONE) 200 MG tablet Take 200 mg by mouth daily.    Marland Kitchen amLODipine (NORVASC) 5 MG tablet Take 5 mg by mouth daily.    Marland Kitchen apixaban (ELIQUIS) 5 MG TABS tablet Take 5 mg by mouth 2 (two) times daily.    Marland Kitchen atorvastatin (LIPITOR) 40 MG tablet Take 40 mg by mouth daily.    . Blood Glucose Monitoring Suppl (ONE TOUCH ULTRA 2) w/Device KIT Check blood sugar three times a day and as directed. Dx E11.319 1 each 0  . busPIRone (BUSPAR) 10 MG tablet TAKE 1 TABLET BY MOUTH AT BEDTIME 90 tablet 2  . cycloSPORINE (RESTASIS) 0.05 % ophthalmic emulsion Place 1 drop into both eyes 2 (two) times daily.    Marland Kitchen diltiazem (CARDIZEM CD) 240 MG 24 hr capsule Take 240 mg by mouth daily.    Marland Kitchen FLUoxetine (PROZAC) 20 MG capsule TAKE THREE CAPSULES BY MOUTH ONCE DAILY 90 capsule 2  . furosemide (LASIX) 20 MG tablet TAKE 1 TABLET BY MOUTH IN THE MORNING 90 tablet 1  . glipiZIDE (GLUCOTROL) 5 MG tablet Take 1 tablet (5 mg total) by mouth 2 (two) times daily before a meal. 60 tablet 3  . glucose blood (ONE TOUCH ULTRA TEST) test strip Check blood sugar three times a day and as directed. Dx E11.319 100 each 5  . glyBURIDE (DIABETA) 5 MG tablet TAKE ONE TABLET BY MOUTH TWICE DAILY WITH MEALS 180 tablet 1  . insulin  glargine (LANTUS) 100 UNIT/ML injection Inject 0.3 mLs (30 Units total) into the skin every evening. 10 mL 2  . lisinopril (PRINIVIL,ZESTRIL) 20 MG tablet Take 1 tablet (20 mg total) by mouth daily. 90 tablet 2  . metFORMIN (GLUCOPHAGE) 500 MG tablet TAKE ONE TABLET BY MOUTH TWICE DAILY WITH MEALS 180 tablet 5  . ondansetron (ZOFRAN) 8 MG tablet Take 1 tablet (8 mg total) by mouth every 8 (eight) hours as needed for nausea or vomiting. Will not make drowsy. 30 tablet 1  . ONETOUCH DELICA LANCETS FINE MISC Check blood sugar three times a day and as directed. Dx E11.319 100 each 5  . prochlorperazine (COMPAZINE) 10 MG tablet Take 10 mg by mouth every 6 (six) hours as needed.    . ranitidine (ZANTAC) 150 MG tablet TAKE 1 TABLET BY MOUTH TWICE DAILY 180 tablet 2  . sodium chloride (MURO 128) 5 % ophthalmic ointment Apply to eye.    Marland Kitchen oxyCODONE (OXY IR/ROXICODONE) 5 MG immediate release tablet Take 5 mg by mouth every 4 (four) hours as needed.     No current facility-administered medications on file prior to visit.     Allergies  Allergen Reactions  .  Aspirin Other (See Comments)    asthma  . Lorazepam Shortness Of Breath  . Codeine Nausea And Vomiting    Past Medical History:  Diagnosis Date  . Acute upper respiratory infections of unspecified site   . Anemia, unspecified   . Anxiety state, unspecified   . Cellulitis   . Chronic diastolic heart failure ([place])   . Congestive heart failure, unspecified   . Contact dermatitis and other eczema, due to unspecified cause   . Depressive disorder, not elsewhere classified   . Diabetes mellitus   . Essential hypertension, benign   . Mastoiditis   . Other and unspecified hyperlipidemia   . Other diseases of lung, not elsewhere classified   . Other dyspnea and respiratory abnormality   . Recent retinal detachment, partial, with giant tear   . Type II or unspecified type diabetes mellitus with peripheral circulatory disorders, not stated as  uncontrolled(250.70)   . Unspecified asthma, with exacerbation   . Unspecified urinary incontinence   . Wheezing     Past Surgical History:  Procedure Laterality Date  . ABDOMINAL HYSTERECTOMY    . DILATION AND CURETTAGE OF UTERUS    . EYE SURGERY     Left retina repair- 11/09, 02-20-09  . KNEE SURGERY  2003   meniscus  . LAPAROSCOPIC GASTRIC BANDING  08/21/10    Family History  Problem Relation Age of Onset  . Cancer Mother        breast  . Cancer Sister        breast  . Cancer Maternal Aunt        breast  . Cancer Maternal Uncle        breast  . Cancer Maternal Grandmother        breast  . Cancer Maternal Uncle     Social History   Socioeconomic History  . Marital status: Married    Spouse name: Not on file  . Number of children: Not on file  . Years of education: Not on file  . Highest education level: Not on file  Social Needs  . Financial resource strain: Not on file  . Food insecurity - worry: Not on file  . Food insecurity - inability: Not on file  . Transportation needs - medical: Not on file  . Transportation needs - non-medical: Not on file  Occupational History  . Occupation: School bus Education administrator: RETIRED  Tobacco Use  . Smoking status: Never Smoker  . Smokeless tobacco: Never Used  Substance and Sexual Activity  . Alcohol use: No    Alcohol/week: 0.0 oz  . Drug use: No  . Sexual activity: No  Other Topics Concern  . Not on file  Social History Narrative  . Not on file   The PMH, PSH, Social History, Family History, Medications, and allergies have been reviewed in [facility], and have been updated if relevant.   Review of Systems  Constitutional: Negative.   Respiratory: Negative.   Cardiovascular: Negative.   Gastrointestinal: Negative.   Skin: Positive for color change and rash.  All other systems reviewed and are negative.      Objective:    BP 134/72 (BP Location: Left Arm, Patient Position: Sitting, Cuff Size: Large)   Pulse  76   Temp 98.7 F (37.1 C) (Oral)   Ht 5' 5.5" (1.664 m)   Wt 243 lb 3.2 oz (110.3 kg)   SpO2 96%   BMI 39.86 kg/m    Physical Exam  Constitutional: She is oriented to person, place, and time. She appears well-developed and well-nourished. No distress.  HENT:  Head: Normocephalic and atraumatic.  Eyes: Conjunctivae are normal.  Neck: Normal range of motion.  Cardiovascular: Normal rate.  Pulmonary/Chest: Effort normal.  Musculoskeletal: Normal range of motion.  Neurological: She is alert and oriented to person, place, and time. No cranial nerve deficit.  Skin: She is not diaphoretic.     Psychiatric: She has a normal mood and affect. Her behavior is normal. Judgment and thought content normal.  Nursing note and vitals reviewed.         Assessment & Plan:   No diagnosis found. No Follow-up on file.

## 2017-09-22 NOTE — Telephone Encounter (Signed)
Patient is calling to reports cellulitis in L ear. She states she woke with it today- she does have a history of this and wants to get it under control before it gets bad. She is under treatment for cancer and is infection risk.  Reason for Disposition . [1] Skin around the wound has become red AND [2] larger than 2 inches (5 cm)  Answer Assessment - Initial Assessment Questions 1. LOCATION: "Where is the wound located?"      Left ear- whole ear 2. WOUND APPEARANCE: "What does the wound look like?"      Red, swollen 3. SIZE: If redness is present, ask: "What is the size of the red area?" (Inches, centimeters, or compare to size of a coin)      Whole ear is red 4. SPREAD: "What's changed in the last day?"  "Do you see any red streaks coming from the wound?"     Patient felt it last night- woke with it this morning 5. ONSET: "When did it start to look infected?"      today 6. MECHANISM: "How did the wound start, what was the cause?"     On inside of ear- she does have a small scratch 7. PAIN: "Is there any pain?" If so, ask: "How bad is the pain?"   (Scale 1-10; or mild, moderate, severe)     Yes- to touch   Moderate pain 8. FEVER: "Do you have a fever?" If so, ask: "What is your temperature, how was it measured, and when did it start?"     no 9. OTHER SYMPTOMS: "Do you have any other symptoms?" (e.g., shaking chills, weakness, rash elsewhere on body)     no 10. PREGNANCY: "Is there any chance you are pregnant?" "When was your last menstrual period?"       n/a  Protocols used: WOUND INFECTION-A-AH

## 2017-09-22 NOTE — Assessment & Plan Note (Signed)
New- treat with 10 day course of doxycycline to cover MRSA given that she is in and out of the hospital frequently and immuno compromised. Call or return to clinic prn if these symptoms worsen or fail to improve as anticipated. The patient indicates understanding of these issues and agrees with the plan.

## 2017-09-28 ENCOUNTER — Other Ambulatory Visit: Payer: Self-pay | Admitting: Family Medicine

## 2017-10-01 ENCOUNTER — Other Ambulatory Visit: Payer: Self-pay | Admitting: Family Medicine

## 2017-10-06 MED ORDER — GLYBURIDE 5 MG PO TABS
5.00 | ORAL_TABLET | ORAL | Status: DC
Start: 2017-10-06 — End: 2017-10-06

## 2017-10-06 MED ORDER — DILTIAZEM HCL ER COATED BEADS 180 MG PO CP24
360.00 | ORAL_CAPSULE | ORAL | Status: DC
Start: 2017-10-06 — End: 2017-10-06

## 2017-10-06 MED ORDER — FLUOXETINE HCL 20 MG PO CAPS
60.00 | ORAL_CAPSULE | ORAL | Status: DC
Start: 2017-10-05 — End: 2017-10-06

## 2017-10-06 MED ORDER — BUSPIRONE HCL 10 MG PO TABS
10.00 | ORAL_TABLET | ORAL | Status: DC
Start: 2017-10-05 — End: 2017-10-06

## 2017-10-06 MED ORDER — INSULIN GLARGINE 100 UNIT/ML ~~LOC~~ SOLN
30.00 | SUBCUTANEOUS | Status: DC
Start: 2017-10-05 — End: 2017-10-06

## 2017-10-06 MED ORDER — SODIUM CHLORIDE (HYPERTONIC) 5 % OP OINT
TOPICAL_OINTMENT | OPHTHALMIC | Status: DC
Start: 2017-10-05 — End: 2017-10-06

## 2017-10-06 MED ORDER — APIXABAN 5 MG PO TABS
5.00 | ORAL_TABLET | ORAL | Status: DC
Start: 2017-10-05 — End: 2017-10-06

## 2017-10-06 MED ORDER — AMIODARONE HCL 200 MG PO TABS
200.00 | ORAL_TABLET | ORAL | Status: DC
Start: 2017-10-06 — End: 2017-10-06

## 2017-10-06 MED ORDER — ACETAMINOPHEN 325 MG PO TABS
650.00 | ORAL_TABLET | ORAL | Status: DC
Start: ? — End: 2017-10-06

## 2017-10-06 MED ORDER — RANITIDINE HCL 150 MG PO TABS
150.00 | ORAL_TABLET | ORAL | Status: DC
Start: 2017-10-06 — End: 2017-10-06

## 2017-10-06 MED ORDER — CYCLOSPORINE 0.05 % OP EMUL
1.00 | OPHTHALMIC | Status: DC
Start: 2017-10-06 — End: 2017-10-06

## 2017-10-26 MED ORDER — PROCHLORPERAZINE EDISYLATE 5 MG/ML IJ SOLN
5.00 | INTRAMUSCULAR | Status: DC
Start: ? — End: 2017-10-26

## 2017-10-26 MED ORDER — ACETAMINOPHEN 325 MG PO TABS
650.00 | ORAL_TABLET | ORAL | Status: DC
Start: ? — End: 2017-10-26

## 2017-10-26 MED ORDER — GLYBURIDE 5 MG PO TABS
5.00 | ORAL_TABLET | ORAL | Status: DC
Start: 2017-10-26 — End: 2017-10-26

## 2017-10-26 MED ORDER — APIXABAN 5 MG PO TABS
5.00 | ORAL_TABLET | ORAL | Status: DC
Start: 2017-10-26 — End: 2017-10-26

## 2017-10-26 MED ORDER — BUSPIRONE HCL 10 MG PO TABS
10.00 | ORAL_TABLET | ORAL | Status: DC
Start: 2017-10-26 — End: 2017-10-26

## 2017-10-26 MED ORDER — GLUCAGON HCL RDNA (DIAGNOSTIC) 1 MG IJ SOLR
1.00 | INTRAMUSCULAR | Status: DC
Start: ? — End: 2017-10-26

## 2017-10-26 MED ORDER — FUROSEMIDE 40 MG PO TABS
20.00 | ORAL_TABLET | ORAL | Status: DC
Start: 2017-10-27 — End: 2017-10-26

## 2017-10-26 MED ORDER — LIDOCAINE HCL (PF) 1 % IJ SOLN
.50 | INTRAMUSCULAR | Status: DC
Start: ? — End: 2017-10-26

## 2017-10-26 MED ORDER — ONDANSETRON HCL 4 MG PO TABS
4.00 | ORAL_TABLET | ORAL | Status: DC
Start: ? — End: 2017-10-26

## 2017-10-26 MED ORDER — DEXTROSE 50 % IV SOLN
12.50 | INTRAVENOUS | Status: DC
Start: ? — End: 2017-10-26

## 2017-10-26 MED ORDER — FLUOXETINE HCL 20 MG PO CAPS
60.00 | ORAL_CAPSULE | ORAL | Status: DC
Start: 2017-10-26 — End: 2017-10-26

## 2017-10-26 MED ORDER — MOXIFLOXACIN HCL 0.5 % OP SOLN
1.00 | OPHTHALMIC | Status: DC
Start: 2017-10-26 — End: 2017-10-26

## 2017-10-26 MED ORDER — OXYCODONE HCL 5 MG PO TABS
5.00 | ORAL_TABLET | ORAL | Status: DC
Start: ? — End: 2017-10-26

## 2017-10-26 MED ORDER — AMIODARONE HCL 200 MG PO TABS
200.00 | ORAL_TABLET | ORAL | Status: DC
Start: 2017-10-27 — End: 2017-10-26

## 2017-10-26 MED ORDER — DILTIAZEM HCL ER COATED BEADS 180 MG PO CP24
360.00 | ORAL_CAPSULE | ORAL | Status: DC
Start: 2017-10-27 — End: 2017-10-26

## 2017-10-26 MED ORDER — BACITRACIN 500 UNIT/GM OP OINT
TOPICAL_OINTMENT | OPHTHALMIC | Status: DC
Start: 2017-10-26 — End: 2017-10-26

## 2017-10-26 MED ORDER — INSULIN GLARGINE 100 UNIT/ML ~~LOC~~ SOLN
30.00 | SUBCUTANEOUS | Status: DC
Start: 2017-10-26 — End: 2017-10-26

## 2017-10-26 MED ORDER — ONDANSETRON 4 MG PO TBDP
8.00 | ORAL_TABLET | ORAL | Status: DC
Start: 2017-10-27 — End: 2017-10-26

## 2017-10-26 MED ORDER — LISINOPRIL 20 MG PO TABS
20.00 | ORAL_TABLET | ORAL | Status: DC
Start: 2017-10-27 — End: 2017-10-26

## 2017-10-26 MED ORDER — RANITIDINE HCL 150 MG PO TABS
150.00 | ORAL_TABLET | ORAL | Status: DC
Start: 2017-10-26 — End: 2017-10-26

## 2017-10-26 MED ORDER — PROCHLORPERAZINE MALEATE 10 MG PO TABS
10.00 | ORAL_TABLET | ORAL | Status: DC
Start: ? — End: 2017-10-26

## 2017-10-26 MED ORDER — CYCLOSPORINE 0.05 % OP EMUL
1.00 | OPHTHALMIC | Status: DC
Start: 2017-10-26 — End: 2017-10-26

## 2017-10-26 MED ORDER — ATORVASTATIN CALCIUM 40 MG PO TABS
40.00 | ORAL_TABLET | ORAL | Status: DC
Start: 2017-10-27 — End: 2017-10-26

## 2017-11-13 ENCOUNTER — Other Ambulatory Visit: Payer: Self-pay | Admitting: Family Medicine

## 2017-11-14 ENCOUNTER — Telehealth: Payer: Self-pay | Admitting: Family Medicine

## 2017-11-14 NOTE — Telephone Encounter (Signed)
Copied from [place] [id number]. Topic: Quick Communication - Rx Refill/Question >> Nov 14, 2017  4:19 PM Stovall, Shana A wrote: Medication:  glipiZIDE (GLUCOTROL) 5 MG tablet [[id number]]  pt is wanting glipizide and NOT the glyBURIDE (DIABETA) 5 MG tablet  Has the patient contacted their pharmacy? yes   (Agent: If no, request that the patient contact the pharmacy for the refill.)   Preferred Pharmacy (with phone number or street name): [facility] in [address] - ins will only cover glipizide    Agent: Please be advised that RX refills may take up to 3 business days. We ask that you follow-up with your pharmacy.

## 2017-11-14 NOTE — Telephone Encounter (Signed)
Sent to provider 

## 2017-11-18 ENCOUNTER — Ambulatory Visit: Payer: Self-pay

## 2017-11-18 MED ORDER — GLIPIZIDE 5 MG PO TABS: 5.0000 mg | ORAL_TABLET | Freq: Two times a day (BID) | ORAL | 3 refills | Status: DC

## 2017-11-18 NOTE — Telephone Encounter (Signed)
Medication:  glipiZIDE (GLUCOTROL) 5 MG tablet [[id number]]  pt is wanting glipizide and NOT the glyBURIDE (DIABETA) 5 MG tablet  Has the patient contacted their pharmacy? yes   (Agent: If no, request that the patient contact the pharmacy for the refill.)   Preferred Pharmacy (with phone number or street name): [facility] in [address] - ins will only cover glipizide   Note: Pt is now out of medication and CBG 274. Advised to drink plenty of water and to call back if CBG's continue to be high.    Reason for Disposition . Caller has NON-URGENT medication question about med that PCP prescribed and triager unable to answer question  Answer Assessment - Initial Assessment Questions 1. BLOOD GLUCOSE: "What is your blood glucose level?"      274 2. ONSET: "When did you check the blood glucose?"     0900 fasting blood sugar 3. USUAL RANGE: "What is your glucose level usually?" (e.g., usual fasting morning value, usual evening value)     Am: 107-147  Does not check in the evening 4. KETONES: "Do you check for ketones (urine or blood test strips)?" If yes, ask: "What does the test show now?"      no 5. TYPE 1 or 2:  "Do you know what type of diabetes you have?"  (e.g., Type 1, Type 2, Gestational; doesn't know)      Type 2 diabetes 6. INSULIN: "Do you take insulin?" If yes, ask: "Have you missed any shots recently?"     Lantus 30 units at night- has not missed any doses 7. DIABETES PILLS: "Do you take any pills for your diabetes?" If yes, ask: "Have you missed taking any pills recently?"     Yes - last time she took a dose was Thursday requested refill Friday 8. OTHER SYMPTOMS: "Do you have any symptoms?" (e.g., fever, frequent urination, difficulty breathing, dizziness, weakness, vomiting)     no 9. PREGNANCY: "Is there any chance you are pregnant?" "When was your last menstrual period?"     n/a  Protocols used: DIABETES - HIGH BLOOD SUGAR-A-AH

## 2017-11-18 NOTE — Telephone Encounter (Signed)
Okay to refill as requested.

## 2017-11-18 NOTE — Telephone Encounter (Signed)
Completed/thx dmf 

## 2017-12-03 ENCOUNTER — Other Ambulatory Visit: Payer: Self-pay | Admitting: Family Medicine

## 2017-12-16 ENCOUNTER — Other Ambulatory Visit: Payer: Self-pay | Admitting: Family Medicine

## 2017-12-17 NOTE — Telephone Encounter (Signed)
Pt. Calling back to ask if her Lasix can be filled today?

## 2017-12-17 NOTE — Telephone Encounter (Signed)
lasix refill Last OV: 04/17/17 Last Refill:06/18/17 #90 tabs 1 RF Pharmacy:[facility] [place] [employer]

## 2017-12-17 NOTE — Telephone Encounter (Signed)
Pt following up on refill request for furosemide (LASIX) 20 MG tablet  Pt states she called the pharmacy on Monday. Pt states she is completely out. Pt states she is a cancer pt and needs this Rx today if we could possibly send.  Advised pt to call 5 days in advance next time so she would not have to worry again.  Pt verbalized understanding.   [facility] [address] [place] [phone] (Phone) [phone] (Fax)

## 2017-12-18 ENCOUNTER — Other Ambulatory Visit: Payer: Self-pay

## 2017-12-18 MED ORDER — FUROSEMIDE 20 MG PO TABS: 20.0000 mg | ORAL_TABLET | Freq: Every morning | ORAL | 3 refills | Status: DC

## 2017-12-18 NOTE — Telephone Encounter (Signed)
Already sent in/thx dmf

## 2017-12-18 NOTE — Telephone Encounter (Signed)
Completed/thx dmf 

## 2018-03-06 ENCOUNTER — Other Ambulatory Visit: Payer: Self-pay | Admitting: Family Medicine

## 2018-03-06 ENCOUNTER — Telehealth: Payer: Self-pay | Admitting: Family Medicine

## 2018-03-06 NOTE — Telephone Encounter (Signed)
Copied from [place] [id number]. Topic: Quick Communication - Rx Refill/Question >> Mar 06, 2018  2:29 PM Synthia Innocent wrote: Medication: One touch Ultra test strips  Has the patient contacted their pharmacy? Yes.   (Agent: If no, request that the patient contact the pharmacy for the refill.) (Agent: If yes, when and what did the pharmacy advise?)  Preferred Pharmacy (with phone number or street name): [facility]  Agent: Please be advised that RX refills may take up to 3 business days. We ask that you follow-up with your pharmacy.

## 2018-03-09 NOTE — Telephone Encounter (Signed)
Medication filled on 6/24

## 2018-03-13 MED ORDER — APIXABAN 5 MG PO TABS
5.00 | ORAL_TABLET | ORAL | Status: DC
Start: 2018-03-12 — End: 2018-03-13

## 2018-03-13 MED ORDER — INSULIN LISPRO 100 UNIT/ML ~~LOC~~ SOLN
.00 | SUBCUTANEOUS | Status: DC
Start: 2018-03-13 — End: 2018-03-13

## 2018-03-13 MED ORDER — FLUOXETINE HCL 20 MG PO CAPS
60.00 | ORAL_CAPSULE | ORAL | Status: DC
Start: 2018-03-12 — End: 2018-03-13

## 2018-03-13 MED ORDER — VALACYCLOVIR HCL 1 G PO TABS
1000.00 | ORAL_TABLET | ORAL | Status: DC
Start: 2018-03-13 — End: 2018-03-13

## 2018-03-13 MED ORDER — INSULIN GLARGINE 100 UNIT/ML ~~LOC~~ SOLN
30.00 | SUBCUTANEOUS | Status: DC
Start: 2018-03-12 — End: 2018-03-13

## 2018-03-13 MED ORDER — ACETAMINOPHEN 325 MG PO TABS
650.00 | ORAL_TABLET | ORAL | Status: DC
Start: ? — End: 2018-03-13

## 2018-03-13 MED ORDER — AMIODARONE HCL 200 MG PO TABS
200.00 | ORAL_TABLET | ORAL | Status: DC
Start: 2018-03-13 — End: 2018-03-13

## 2018-03-13 MED ORDER — ATROPINE SULFATE 1 % OP SOLN
1.00 | OPHTHALMIC | Status: DC
Start: 2018-03-13 — End: 2018-03-13

## 2018-03-13 MED ORDER — OXYCODONE HCL 5 MG PO TABS
5.00 | ORAL_TABLET | ORAL | Status: DC
Start: ? — End: 2018-03-13

## 2018-03-13 MED ORDER — RANITIDINE HCL 150 MG PO TABS
150.00 | ORAL_TABLET | ORAL | Status: DC
Start: 2018-03-12 — End: 2018-03-13

## 2018-03-13 MED ORDER — DEXTROSE 50 % IV SOLN
12.50 | INTRAVENOUS | Status: DC
Start: ? — End: 2018-03-13

## 2018-03-13 MED ORDER — GENERIC EXTERNAL MEDICATION
1.00 | Status: DC
Start: ? — End: 2018-03-13

## 2018-03-13 MED ORDER — LIDOCAINE HCL (PF) 1 % IJ SOLN
.50 | INTRAMUSCULAR | Status: DC
Start: ? — End: 2018-03-13

## 2018-03-13 MED ORDER — ONDANSETRON HCL 4 MG/2ML IJ SOLN
4.00 | INTRAMUSCULAR | Status: DC
Start: ? — End: 2018-03-13

## 2018-03-13 MED ORDER — CYCLOSPORINE 0.05 % OP EMUL
1.00 | OPHTHALMIC | Status: DC
Start: 2018-03-13 — End: 2018-03-13

## 2018-03-13 MED ORDER — ATORVASTATIN CALCIUM 40 MG PO TABS
40.00 | ORAL_TABLET | ORAL | Status: DC
Start: 2018-03-13 — End: 2018-03-13

## 2018-03-13 MED ORDER — GENERIC EXTERNAL MEDICATION
100.00 | Status: DC
Start: 2018-03-13 — End: 2018-03-13

## 2018-03-13 MED ORDER — POLYVINYL ALCOHOL 1.4 % OP SOLN
1.00 | OPHTHALMIC | Status: DC
Start: ? — End: 2018-03-13

## 2018-03-13 MED ORDER — BUSPIRONE HCL 10 MG PO TABS
10.00 | ORAL_TABLET | ORAL | Status: DC
Start: 2018-03-12 — End: 2018-03-13

## 2018-04-06 LAB — BASIC METABOLIC PANEL
BUN: 26 — AB (ref 4–21)
CREATININE: 2.2 — AB (ref 0.5–1.1)
GLUCOSE: 67
Potassium: 4.7 (ref 3.4–5.3)
SODIUM: 139 (ref 137–147)

## 2018-04-07 ENCOUNTER — Other Ambulatory Visit: Payer: Self-pay | Admitting: Family Medicine

## 2018-04-07 ENCOUNTER — Encounter: Payer: Self-pay | Admitting: Family Medicine

## 2018-04-07 NOTE — Progress Notes (Signed)
[facility]/thx dmf

## 2018-04-30 ENCOUNTER — Other Ambulatory Visit: Payer: Self-pay | Admitting: Family Medicine

## 2018-05-04 LAB — POCT INR: INR: 1.4 — AB (ref <=1.1)

## 2018-05-05 LAB — BASIC METABOLIC PANEL
BUN: 37 — AB (ref 4–21)
Creatinine: 2.3 — AB (ref 0.5–1.1)
GLUCOSE: 151
Potassium: 4.9 (ref 3.4–5.3)
SODIUM: 137 (ref 137–147)

## 2018-05-05 LAB — CBC AND DIFFERENTIAL
HEMATOCRIT: 23 — AB (ref 36–46)
HEMOGLOBIN: 7.4 — AB (ref 12.0–16.0)
Platelets: 275 (ref 150–399)
WBC: 3.9

## 2018-05-06 LAB — BASIC METABOLIC PANEL
BUN: 39 — AB (ref 4–21)
Creatinine: 2.6 — AB (ref 0.5–1.1)
GLUCOSE: 151
Potassium: 4.7 (ref 3.4–5.3)
Sodium: 137 (ref 137–147)

## 2018-05-06 LAB — CBC AND DIFFERENTIAL
HCT: 24 — AB (ref 36–46)
Hemoglobin: 7.7 — AB (ref 12.0–16.0)
PLATELETS: 245 (ref 150–399)
WBC: 1.8

## 2018-05-07 LAB — CBC AND DIFFERENTIAL
HCT: 23 — AB (ref 36–46)
HEMOGLOBIN: 7.5 — AB (ref 12.0–16.0)
Platelets: 218 (ref 150–399)
WBC: 2.1

## 2018-05-07 LAB — BASIC METABOLIC PANEL
BUN: 35 — AB (ref 4–21)
Creatinine: 2.5 — AB (ref 0.5–1.1)
GLUCOSE: 207
Potassium: 4.2 (ref 3.4–5.3)
Sodium: 134 — AB (ref 137–147)

## 2018-05-08 ENCOUNTER — Telehealth: Payer: Self-pay | Admitting: Family Medicine

## 2018-05-08 NOTE — Telephone Encounter (Signed)
Copied from [place] [id number]. Topic: Quick Communication - See Telephone Encounter >> May 08, 2018  8:23 AM Mylinda Latina, NT wrote: CRM for notification. See Telephone encounter for: 05/08/18. Patient husband called and wanted to make Dr. Deborra Medina aware that the patient fell on Monday. She had an reaction from her chemo. She is at [facility] and is hoping to be discharged soon. He states that she was in ICU but she is on a step down unit

## 2018-05-08 NOTE — Telephone Encounter (Signed)
Please thank him for letting us know and let her know that we are thinking of her.

## 2018-05-08 NOTE — Telephone Encounter (Signed)
TA-Plz see message that pt fell on Monday due to a reaction from her chemotherapy/She is at [facility] and came down from ICU to a step-down-unit and is hoping to be [name]/C'ed soon/Husband wanted to make you aware/thx dmf

## 2018-05-08 NOTE — Telephone Encounter (Signed)
Spouse is thankful/will keep [place] updated/thx dmf

## 2018-05-20 ENCOUNTER — Encounter: Payer: Self-pay | Admitting: Family Medicine

## 2018-05-20 NOTE — Progress Notes (Signed)
[facility]/thx dmf

## 2018-05-26 ENCOUNTER — Other Ambulatory Visit: Payer: Self-pay | Admitting: Family Medicine

## 2018-06-09 ENCOUNTER — Other Ambulatory Visit: Payer: Self-pay

## 2018-06-09 ENCOUNTER — Inpatient Hospital Stay: Payer: Medicare Other

## 2018-06-09 ENCOUNTER — Inpatient Hospital Stay
Admission: EM | Admit: 2018-06-09 | Discharge: 2018-06-11 | DRG: 193 | Disposition: A | Discharge status: Home / Self Care | Payer: Medicare Other | Attending: Internal Medicine | Admitting: Internal Medicine

## 2018-06-09 ENCOUNTER — Emergency Department: Payer: Medicare Other

## 2018-06-09 ENCOUNTER — Encounter: Payer: Self-pay | Admitting: Emergency Medicine

## 2018-06-09 DIAGNOSIS — Z7189 Other specified counseling: Secondary | ICD-10-CM | POA: Diagnosis not present

## 2018-06-09 DIAGNOSIS — E1165 Type 2 diabetes mellitus with hyperglycemia: Secondary | ICD-10-CM | POA: Diagnosis present

## 2018-06-09 DIAGNOSIS — J810 Acute pulmonary edema: Secondary | ICD-10-CM | POA: Diagnosis not present

## 2018-06-09 DIAGNOSIS — I110 Hypertensive heart disease with heart failure: Secondary | ICD-10-CM | POA: Diagnosis present

## 2018-06-09 DIAGNOSIS — E872 Acidosis: Secondary | ICD-10-CM | POA: Diagnosis present

## 2018-06-09 DIAGNOSIS — I5033 Acute on chronic diastolic (congestive) heart failure: Secondary | ICD-10-CM | POA: Diagnosis present

## 2018-06-09 DIAGNOSIS — E1151 Type 2 diabetes mellitus with diabetic peripheral angiopathy without gangrene: Secondary | ICD-10-CM | POA: Diagnosis present

## 2018-06-09 DIAGNOSIS — E785 Hyperlipidemia, unspecified: Secondary | ICD-10-CM | POA: Diagnosis present

## 2018-06-09 DIAGNOSIS — J9601 Acute respiratory failure with hypoxia: Secondary | ICD-10-CM | POA: Diagnosis present

## 2018-06-09 DIAGNOSIS — N179 Acute kidney failure, unspecified: Secondary | ICD-10-CM | POA: Diagnosis present

## 2018-06-09 DIAGNOSIS — C569 Malignant neoplasm of unspecified ovary: Secondary | ICD-10-CM | POA: Diagnosis present

## 2018-06-09 DIAGNOSIS — R0602 Shortness of breath: Secondary | ICD-10-CM | POA: Diagnosis present

## 2018-06-09 DIAGNOSIS — I4891 Unspecified atrial fibrillation: Secondary | ICD-10-CM | POA: Diagnosis present

## 2018-06-09 DIAGNOSIS — E669 Obesity, unspecified: Secondary | ICD-10-CM | POA: Diagnosis present

## 2018-06-09 DIAGNOSIS — J189 Pneumonia, unspecified organism: Secondary | ICD-10-CM | POA: Diagnosis present

## 2018-06-09 DIAGNOSIS — Z6841 Body Mass Index (BMI) 40.0 and over, adult: Secondary | ICD-10-CM

## 2018-06-09 DIAGNOSIS — J9801 Acute bronchospasm: Secondary | ICD-10-CM | POA: Diagnosis not present

## 2018-06-09 DIAGNOSIS — Z90710 Acquired absence of both cervix and uterus: Secondary | ICD-10-CM | POA: Diagnosis not present

## 2018-06-09 DIAGNOSIS — Z7901 Long term (current) use of anticoagulants: Secondary | ICD-10-CM | POA: Diagnosis not present

## 2018-06-09 DIAGNOSIS — D6481 Anemia due to antineoplastic chemotherapy: Secondary | ICD-10-CM | POA: Diagnosis present

## 2018-06-09 DIAGNOSIS — E119 Type 2 diabetes mellitus without complications: Secondary | ICD-10-CM | POA: Diagnosis not present

## 2018-06-09 DIAGNOSIS — J9600 Acute respiratory failure, unspecified whether with hypoxia or hypercapnia: Secondary | ICD-10-CM | POA: Diagnosis present

## 2018-06-09 DIAGNOSIS — Z79899 Other long term (current) drug therapy: Secondary | ICD-10-CM

## 2018-06-09 DIAGNOSIS — F411 Generalized anxiety disorder: Secondary | ICD-10-CM | POA: Diagnosis present

## 2018-06-09 DIAGNOSIS — J45909 Unspecified asthma, uncomplicated: Secondary | ICD-10-CM | POA: Diagnosis present

## 2018-06-09 DIAGNOSIS — I509 Heart failure, unspecified: Secondary | ICD-10-CM | POA: Diagnosis not present

## 2018-06-09 DIAGNOSIS — T451X5A Adverse effect of antineoplastic and immunosuppressive drugs, initial encounter: Secondary | ICD-10-CM | POA: Diagnosis present

## 2018-06-09 DIAGNOSIS — D649 Anemia, unspecified: Secondary | ICD-10-CM | POA: Diagnosis not present

## 2018-06-09 DIAGNOSIS — Y95 Nosocomial condition: Secondary | ICD-10-CM | POA: Diagnosis present

## 2018-06-09 DIAGNOSIS — Z794 Long term (current) use of insulin: Secondary | ICD-10-CM

## 2018-06-09 DIAGNOSIS — C799 Secondary malignant neoplasm of unspecified site: Secondary | ICD-10-CM | POA: Diagnosis not present

## 2018-06-09 DIAGNOSIS — Z9221 Personal history of antineoplastic chemotherapy: Secondary | ICD-10-CM

## 2018-06-09 DIAGNOSIS — I350 Nonrheumatic aortic (valve) stenosis: Secondary | ICD-10-CM | POA: Diagnosis not present

## 2018-06-09 LAB — COMPREHENSIVE METABOLIC PANEL
ALK PHOS: 102 U/L (ref 38–126)
ALT: 31 U/L (ref 0–44)
AST: 41 U/L (ref 15–41)
Albumin: 2.9 g/dL — ABNORMAL LOW (ref 3.5–5.0)
Anion gap: 11 (ref 5–15)
BUN: 22 mg/dL (ref 8–23)
CALCIUM: 7.3 mg/dL — AB (ref 8.9–10.3)
CO2: 20 mmol/L — ABNORMAL LOW (ref 22–32)
CREATININE: 1.72 mg/dL — AB (ref 0.44–1.00)
Chloride: 104 mmol/L (ref 98–111)
GFR calc non Af Amer: 29 mL/min — ABNORMAL LOW (ref >60)
GFR, EST AFRICAN AMERICAN: 34 mL/min — AB (ref >60)
Glucose, Bld: 283 mg/dL — ABNORMAL HIGH (ref 70–99)
Potassium: 4 mmol/L (ref 3.5–5.1)
SODIUM: 135 mmol/L (ref 135–145)
Total Bilirubin: 0.5 mg/dL (ref 0.3–1.2)
Total Protein: 6.5 g/dL (ref 6.5–8.1)

## 2018-06-09 LAB — CBC WITH DIFFERENTIAL/PLATELET
Basophils Absolute: 0.1 10*3/uL (ref 0–0.1)
Basophils Relative: 1 %
EOS ABS: 0.1 10*3/uL (ref 0–0.7)
Eosinophils Relative: 1 %
HCT: 13.3 % — CL (ref 35.0–47.0)
HEMOGLOBIN: 4.6 g/dL — AB (ref 12.0–16.0)
LYMPHS PCT: 14 %
Lymphs Abs: 1.2 10*3/uL (ref 1.0–3.6)
MCH: 32.2 pg (ref 26.0–34.0)
MCHC: 34.4 g/dL (ref 32.0–36.0)
MCV: 93.5 fL (ref 80.0–100.0)
Monocytes Absolute: 0.6 10*3/uL (ref 0.2–0.9)
Monocytes Relative: 6 %
NEUTROS PCT: 78 %
Neutro Abs: 7 10*3/uL — ABNORMAL HIGH (ref 1.4–6.5)
Platelets: 287 10*3/uL (ref 150–440)
RBC: 1.42 MIL/uL — AB (ref 3.80–5.20)
RDW: 17.3 % — ABNORMAL HIGH (ref 11.5–14.5)
WBC: 8.9 10*3/uL (ref 3.6–11.0)

## 2018-06-09 LAB — ABO/RH: ABO/RH(D): A POS

## 2018-06-09 LAB — PREPARE RBC (CROSSMATCH)

## 2018-06-09 LAB — GLUCOSE, CAPILLARY: Glucose-Capillary: 269 mg/dL — ABNORMAL HIGH (ref 70–99)

## 2018-06-09 LAB — TROPONIN I: Troponin I: <0.03 ng/mL (ref <0.03)

## 2018-06-09 LAB — MRSA PCR SCREENING: MRSA by PCR: NEGATIVE

## 2018-06-09 LAB — LACTIC ACID, PLASMA: Lactic Acid, Venous: 1.7 mmol/L (ref 0.5–1.9)

## 2018-06-09 LAB — BRAIN NATRIURETIC PEPTIDE: B Natriuretic Peptide: 282 pg/mL — ABNORMAL HIGH (ref 0.0–100.0)

## 2018-06-09 MED ORDER — BUSPIRONE HCL 10 MG PO TABS
10.0000 mg | ORAL_TABLET | Freq: Every day | ORAL | Status: DC
  Administered 2018-06-09 – 2018-06-10 (×2): 10 mg via ORAL
  Filled 2018-06-09 (×3): qty 1

## 2018-06-09 MED ORDER — FLUOXETINE HCL 20 MG PO CAPS
60.0000 mg | ORAL_CAPSULE | Freq: Every day | ORAL | Status: DC
  Administered 2018-06-11: 60 mg via ORAL
  Filled 2018-06-09 (×2): qty 3

## 2018-06-09 MED ORDER — ATORVASTATIN CALCIUM 20 MG PO TABS
40.0000 mg | ORAL_TABLET | Freq: Every day | ORAL | Status: DC
  Administered 2018-06-10: 40 mg via ORAL
  Filled 2018-06-09: qty 2

## 2018-06-09 MED ORDER — SODIUM CHLORIDE 0.9 % IV SOLN
2.0000 g | Freq: Once | INTRAVENOUS | Status: AC
  Administered 2018-06-09: 2 g via INTRAVENOUS
  Filled 2018-06-09: qty 2

## 2018-06-09 MED ORDER — VANCOMYCIN HCL 10 G IV SOLR
1250.0000 mg | INTRAVENOUS | Status: DC
  Administered 2018-06-10 – 2018-06-11 (×2): 1250 mg via INTRAVENOUS
  Filled 2018-06-09 (×2): qty 1250

## 2018-06-09 MED ORDER — FAMOTIDINE IN NACL 20-0.9 MG/50ML-% IV SOLN
20.0000 mg | Freq: Two times a day (BID) | INTRAVENOUS | Status: DC
  Administered 2018-06-09 – 2018-06-10 (×3): 20 mg via INTRAVENOUS
  Filled 2018-06-09 (×4): qty 50

## 2018-06-09 MED ORDER — ONDANSETRON HCL 4 MG/2ML IJ SOLN: 4.0000 mg | Freq: Four times a day (QID) | INTRAMUSCULAR | Status: DC | PRN

## 2018-06-09 MED ORDER — TAMSULOSIN HCL 0.4 MG PO CAPS
0.4000 mg | ORAL_CAPSULE | Freq: Every day | ORAL | Status: DC
  Administered 2018-06-09 – 2018-06-11 (×3): 0.4 mg via ORAL
  Filled 2018-06-09 (×3): qty 1

## 2018-06-09 MED ORDER — SODIUM CHLORIDE 0.9 % IV SOLN
10.0000 mL/h | Freq: Once | INTRAVENOUS | Status: AC
  Administered 2018-06-09: 10 mL/h via INTRAVENOUS

## 2018-06-09 MED ORDER — ONDANSETRON HCL 4 MG PO TABS
4.0000 mg | ORAL_TABLET | Freq: Four times a day (QID) | ORAL | Status: DC | PRN
  Administered 2018-06-11: 4 mg via ORAL
  Filled 2018-06-09: qty 1

## 2018-06-09 MED ORDER — INSULIN ASPART 100 UNIT/ML ~~LOC~~ SOLN
0.0000 [IU] | Freq: Three times a day (TID) | SUBCUTANEOUS | Status: DC
  Administered 2018-06-10: 3 [IU] via SUBCUTANEOUS
  Administered 2018-06-10: 7 [IU] via SUBCUTANEOUS
  Administered 2018-06-10: 9 [IU] via SUBCUTANEOUS
  Administered 2018-06-11 (×2): 2 [IU] via SUBCUTANEOUS
  Filled 2018-06-09 (×5): qty 1

## 2018-06-09 MED ORDER — VANCOMYCIN HCL IN DEXTROSE 1-5 GM/200ML-% IV SOLN
1000.0000 mg | Freq: Once | INTRAVENOUS | Status: AC
  Administered 2018-06-09: 1000 mg via INTRAVENOUS
  Filled 2018-06-09 (×2): qty 200

## 2018-06-09 MED ORDER — SODIUM CHLORIDE 0.9 % IV SOLN
2.0000 g | Freq: Two times a day (BID) | INTRAVENOUS | Status: DC
  Administered 2018-06-10 – 2018-06-11 (×3): 2 g via INTRAVENOUS
  Filled 2018-06-09 (×5): qty 2

## 2018-06-09 MED ORDER — IPRATROPIUM-ALBUTEROL 0.5-2.5 (3) MG/3ML IN SOLN: 3.0000 mL | RESPIRATORY_TRACT | Status: DC | PRN

## 2018-06-09 MED ORDER — ALBUTEROL SULFATE (2.5 MG/3ML) 0.083% IN NEBU
2.5000 mg | INHALATION_SOLUTION | Freq: Once | RESPIRATORY_TRACT | Status: AC
  Administered 2018-06-09: 2.5 mg via RESPIRATORY_TRACT

## 2018-06-09 MED ORDER — DIPHENHYDRAMINE HCL 50 MG/ML IJ SOLN
25.0000 mg | Freq: Once | INTRAMUSCULAR | Status: AC
  Administered 2018-06-09: 25 mg via INTRAVENOUS

## 2018-06-09 MED ORDER — DIPHENHYDRAMINE HCL 50 MG/ML IJ SOLN
INTRAMUSCULAR | Status: AC
  Administered 2018-06-09: 25 mg via INTRAVENOUS
  Filled 2018-06-09: qty 1

## 2018-06-09 MED ORDER — ACETAMINOPHEN 650 MG RE SUPP: 650.0000 mg | Freq: Four times a day (QID) | RECTAL | Status: DC | PRN

## 2018-06-09 MED ORDER — METHYLPREDNISOLONE SODIUM SUCC 125 MG IJ SOLR
125.0000 mg | Freq: Once | INTRAMUSCULAR | Status: AC
  Administered 2018-06-09: 125 mg via INTRAVENOUS
  Filled 2018-06-09: qty 2

## 2018-06-09 MED ORDER — BUDESONIDE 0.25 MG/2ML IN SUSP
0.2500 mg | Freq: Two times a day (BID) | RESPIRATORY_TRACT | Status: DC
  Administered 2018-06-10 – 2018-06-11 (×3): 0.25 mg via RESPIRATORY_TRACT
  Filled 2018-06-09 (×3): qty 2

## 2018-06-09 MED ORDER — INSULIN ASPART 100 UNIT/ML ~~LOC~~ SOLN
0.0000 [IU] | Freq: Every day | SUBCUTANEOUS | Status: DC
  Administered 2018-06-09: 3 [IU] via SUBCUTANEOUS
  Administered 2018-06-10: 2 [IU] via SUBCUTANEOUS
  Filled 2018-06-09 (×2): qty 1

## 2018-06-09 MED ORDER — IPRATROPIUM-ALBUTEROL 0.5-2.5 (3) MG/3ML IN SOLN
3.0000 mL | Freq: Four times a day (QID) | RESPIRATORY_TRACT | Status: DC
  Administered 2018-06-10 (×2): 3 mL via RESPIRATORY_TRACT
  Filled 2018-06-09 (×2): qty 3

## 2018-06-09 MED ORDER — ACETAMINOPHEN 325 MG PO TABS: 650.0000 mg | ORAL_TABLET | Freq: Four times a day (QID) | ORAL | Status: DC | PRN

## 2018-06-09 MED ORDER — IPRATROPIUM-ALBUTEROL 0.5-2.5 (3) MG/3ML IN SOLN
9.0000 mL | Freq: Once | RESPIRATORY_TRACT | Status: AC
  Administered 2018-06-09: 9 mL via RESPIRATORY_TRACT
  Filled 2018-06-09: qty 9

## 2018-06-09 MED ORDER — ALBUTEROL SULFATE (2.5 MG/3ML) 0.083% IN NEBU
INHALATION_SOLUTION | RESPIRATORY_TRACT | Status: AC
  Filled 2018-06-09: qty 3

## 2018-06-09 NOTE — Consult Note (Addendum)
Pharmacy Antibiotic Note  [name] is a 67 y.o. female admitted on 06/09/2018 with acute respiratory failure and sepsis.  Pharmacy has been consulted for Vancomycin and Cefepime dosing.  Plan: Ke: 0.036   Vd: 54.6   T1/2: 19  DW: 78kg  Start Vancomycin 1250 IV every 24 hours with 6 hours stack dosing.  Goal trough 15-20 mcg/mL. Calculated trough @ Css 18. Trough level ordered prior to 4th dose.   Start Cefepime 2g IV veery 12 hours.   Height: 5\' 5"  (165.1 cm) Weight: 242 lb 8.1 oz (110 kg) IBW/kg (Calculated) : 57  Temp (24hrs), Avg:98 F (36.7 C), Min:97.9 F (36.6 C), Max:98 F (36.7 C)  Recent Labs  Lab 06/09/18 1725  WBC 8.9  CREATININE 1.72*    Estimated Creatinine Clearance: 38.1 mL/min (A) (by C-G formula based on SCr of 1.72 mg/dL (H)).    Allergies  Allergen Reactions  . Aspirin Other (See Comments)    asthma  . Lorazepam Shortness Of Breath  . Codeine Nausea And Vomiting    Antimicrobials this admission: 9/24 Flagyl >>  9/24 Cefepime >>  9/24 vancomycin >>  Microbiology results: 9/24 BCx: pending MRSA PCR: -  Thank you for allowing pharmacy to be a part of this patient's care.  Pernell Dupre, PharmD, BCPS Clinical Pharmacist 06/09/2018 8:05 PM

## 2018-06-09 NOTE — Progress Notes (Signed)
   06/09/18 2100  Clinical Encounter Type  Visited With Patient and family together  Visit Type Follow-up;Spiritual support  Referral From Nurse  Consult/Referral To Chaplain  Spiritual Encounters  Spiritual Needs Emotional;Other (Comment)   [place] encountered the Arkin family outside of the ICU. Mr. Omura asked if I could check and see when they could go back to be with Ms. Grigg. I reported to the patient's room and spoke with the patient's RN and she said I could bring two family members back now. I went back to the waiting room and escorted the 2 family members back to [place]. I offered my support and departed to follow up as needed.

## 2018-06-09 NOTE — Progress Notes (Signed)
[facility] Progress Note Patient Name: [name] DOB: [birth year] MRN: [id number]   Date of Service  06/09/2018  HPI/Events of Note  67 yo metastatic ovarian cancer with resp failure on biPAP Full code HCAp?pneumonia?CHF?   eICU Interventions  Wean off biPA Wean off of oxygen Check procalcitonin Admit to step down     Intervention Category Evaluation Type: New Patient Evaluation  Flora Lipps 06/09/2018, 8:41 PM

## 2018-06-09 NOTE — Progress Notes (Signed)
   06/09/18 1900  Clinical Encounter Type  Visited With Patient and family together  Visit Type Initial;Spiritual support;ED  Referral From Nurse  Consult/Referral To Chaplain  Spiritual Encounters  Spiritual Needs Prayer;Emotional   CH was recommended to visit with [name] while I was rounding in the ED. The patient is currently being treated for cancer at [facility]. [name] son's were both less than pleased that their mom had to stay over night at [facility]. I attempted to reassure them and listen to their concerns. [name] was thankful for the visit and asked me to pray for her. I offered pray and informed them that if they need me to come back to have the nurse page me.

## 2018-06-09 NOTE — ED Notes (Signed)
Pt's mouth was swabbed with glycerin swabsticks due to dryness.

## 2018-06-09 NOTE — ED Notes (Signed)
Called [facility] transfer center spoke to [facility] for transfer  [phone]

## 2018-06-09 NOTE — Consult Note (Addendum)
PULMONARY / CRITICAL CARE MEDICINE   Name: [name] MRN: [id number] DOB: Sep 03, 1951    ADMISSION DATE:  06/09/2018 CONSULTATION DATE:  06/09/2018  REFERRING MD:  Dr. Margaretmary Eddy  CHIEF COMPLAINT:  Shortness of Breath  BRIEF DISCUSSION: 67 y.o. Female admitted with Acute Hypoxic Respiratory Failure requiring BiPAP in setting of pulmonary edema vs ? HCAP vs Lymphangitic spread, and with symptomatic anemia.   HISTORY OF PRESENT ILLNESS:   [name] is a 67 y.o. 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  Pt reports that her shortness of breath has been progressive over the last week.  In the ED, initial workup revealed  BNP 282, negative troponin, Hemoglobin 4.6, WBC 8.9 with a left shift, Serum CO2 20, Anion gap 11, Creatinine 1.72, and glucose 283.  CXR is concerning for patchy airspace opacities suspicious for pulmonary edema, with difficulty excluding superimposed pneumonia. CT Chest concerning for diffuse ground glass opacities, and CT Abdomen/Pelvis with new liver lesions. She is admitted to [facility] unit for treatment of Acute Hypoxic Respiratory Failure requiring BiPAP in setting of pulmonary edema vs questionable HCAP vs questionable Lymphangitic spread , and symptomatic anemia.  PCCM is consulted for further management.    Pt and her family have requested that pt be transferred to [facility] for ongoing care.  ED physician Dr. Clearnce Hasten has contacted [facility] transfer center, and [facility] reports that they are currently at capacity and pt will be placed on waiting list, therefore she will be admitted to [facility] and will be transferred to [facility] when bed available.  Subjective: Patient was seen in the morning currently she is on 3 to 4 L oxygen feels much better - As per the patient she received  blood transfusion about 2 weeks ago, she received multiple blood transfusions - She also has history of A. 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 - Questionable history of asthma denies any history of COPD -As per the patient currently she is not having any chest pain, denies any shortness of breath no history of any nausea or vomiting - Potassium is 5.0 creatinine is 1.68 blood sugar is mildly elevated to 381 anion gap is normal -Hemoglobin is stable around 9.1 - Patient is positive about 1500 cc  PAST MEDICAL HISTORY :  She  has a past medical history of Acute upper respiratory infections of unspecified site, Anemia, unspecified, Anxiety state, unspecified, Cellulitis, Chronic diastolic heart failure ([place]), Congestive heart failure, unspecified, Contact dermatitis and other eczema, due to unspecified cause, Depressive disorder, not elsewhere classified, Diabetes mellitus, Essential hypertension, benign, Mastoiditis, Other and unspecified hyperlipidemia, Other diseases of lung, not elsewhere classified, Other dyspnea and respiratory abnormality, Recent retinal detachment, partial, with giant tear, Type II or unspecified type diabetes mellitus with peripheral circulatory disorders, not stated as uncontrolled(250.70), Unspecified asthma, with exacerbation, Unspecified urinary incontinence, and Wheezing.  PAST SURGICAL HISTORY: She  has a past surgical history that includes Knee surgery (2003); Eye surgery; Laparoscopic gastric banding (08/21/10); Abdominal hysterectomy; and Dilation and curettage of uterus.  Allergies  Allergen Reactions  . Aspirin Other (See Comments)    asthma  . Lorazepam Shortness Of Breath  . Codeine Nausea And Vomiting    No current facility-administered medications on file prior to encounter.    Current  Outpatient Medications on File Prior to Encounter  Medication Sig  . amiodarone (PACERONE) 200 MG  tablet Take 200 mg by mouth daily.  Marland Kitchen apixaban (ELIQUIS) 5 MG TABS tablet Take 5 mg by mouth 2 (two) times daily.  Marland Kitchen atorvastatin (LIPITOR) 40 MG tablet Take 40 mg by mouth daily.  . busPIRone (BUSPAR) 10 MG tablet TAKE 1 TABLET BY MOUTH AT BEDTIME  . diltiazem (CARDIZEM CD) 240 MG 24 hr capsule Take 240 mg by mouth daily.  Marland Kitchen FLUoxetine (PROZAC) 20 MG capsule TAKE 3 CAPSULES BY MOUTH ONCE DAILY  . glipiZIDE (GLUCOTROL) 5 MG tablet TAKE 1 TABLET BY MOUTH TWICE DAILY BEFORE MEAL(S)  . LANTUS 100 UNIT/ML injection INJECT 30 UNITS SUBCUTANEOUSLY IN THE EVENING  . lisinopril (PRINIVIL,ZESTRIL) 20 MG tablet Take 1 tablet (20 mg total) by mouth daily.  . metFORMIN (GLUCOPHAGE) 500 MG tablet TAKE 1 TABLET BY MOUTH TWICE DAILY WITH MEALS  . ondansetron (ZOFRAN) 8 MG tablet Take 1 tablet (8 mg total) by mouth every 8 (eight) hours as needed for nausea or vomiting. Will not make drowsy.  . pantoprazole (PROTONIX) 40 MG tablet Take 1 tablet by mouth 2 (two) times daily.  . tamsulosin (FLOMAX) 0.4 MG CAPS capsule Take 1 capsule by mouth daily.  . valACYclovir (VALTREX) 1000 MG tablet Take 1,000 mg by mouth daily.  . Blood Glucose Monitoring Suppl (ONE TOUCH ULTRA 2) w/Device KIT Check blood sugar three times a day and as directed. Dx O32.919  . doxycycline (VIBRA-TABS) 100 MG tablet Take 1 tablet (100 mg total) by mouth 2 (two) times daily. (Patient not taking: Reported on 06/09/2018)  . furosemide (LASIX) 20 MG tablet Take 1 tablet (20 mg total) by mouth every morning.  Marland Kitchen glipiZIDE (GLUCOTROL) 5 MG tablet Take 1 tablet (5 mg total) by mouth 2 (two) times daily before a meal. (Patient not taking: Reported on 06/09/2018)  . glucose blood (ONE TOUCH ULTRA TEST) test strip USE ONE STRIP TO CHECK GLUCOSE THREE TIMES DAILY AND  AS  DIRECTED  . ONETOUCH DELICA LANCETS FINE MISC USE TO TEST BLOOD SUGAR THREE TIMES DAILY AND AS DIRECTED  . ranitidine (ZANTAC) 150 MG tablet TAKE 1 TABLET BY MOUTH TWICE DAILY (Patient  not taking: Reported on 06/09/2018)    FAMILY HISTORY:  Her She indicated that her mother is deceased. She indicated that her father is deceased. She indicated that the status of her sister is unknown. She indicated that the status of her maternal grandmother is unknown. She indicated that the status of her maternal aunt is unknown.   SOCIAL HISTORY: She  reports that she has never smoked. She has never used smokeless tobacco. She reports that she does not drink alcohol or use drugs.  REVIEW OF SYSTEMS:   Positives in BOLD: Gen: Denies fever, chills, weight change, fatigue, night sweats HEENT: Denies blurred vision, double vision, hearing loss, tinnitus, sinus congestion, rhinorrhea, sore throat, neck stiffness, dysphagia PULM: Denies shortness of breath, cough, sputum production, hemoptysis, wheezing CV: Denies chest pain, +edema, orthopnea, paroxysmal nocturnal dyspnea, palpitations GI: Denies abdominal pain, nausea, vomiting, diarrhea, hematochezia, melena, constipation, change in bowel habits GU: Denies dysuria, hematuria, polyuria, oliguria, urethral discharge Endocrine: Denies hot or cold intolerance, polyuria, polyphagia or appetite change Derm: Denies rash, dry skin, scaling or peeling skin change Heme: Denies easy bruising, bleeding, bleeding gums Neuro: Denies headache, numbness, weakness, slurred speech, loss of memory or consciousness   SUBJECTIVE:  Pt reports shortness of breath has greatly improved on  BiPAP Denies cough, sputum production, fever or chills Does report mild bilateral Lower extremity edema, and itching which has improved with Benadryl  VITAL SIGNS: BP 112/63   Pulse 80   Temp 98 F (36.7 C) (Axillary)   Resp 14   Ht _0  (1.651 m)   Wt 110 kg   SpO2 99%   BMI 40.36 kg/m   HEMODYNAMICS:    VENTILATOR SETTINGS: FiO2 (%):  [50 %] 50 %  INTAKE / OUTPUT: No intake/output data recorded.  PHYSICAL EXAMINATION: General:  Acute on chronically ill  appearing female, laying in bed, on BiPAP, in NAD Neuro:  Awake, A&O, follows commands, no focal deficits HEENT:  Atraumatic, normocephalic, neck supple, no JVD Cardiovascular:  RRR, s1s2, no M/R/G, 2+ pulses throughout Lungs:  Mild inspiratory/expiratory wheezing bilaterally, even, non-labored, BiPAP assisted Abdomen:  Obese, soft, non-tender, non-distended, BS hypoactive Musculoskeletal:  No deformities Skin:  Warm, dry.  No obvious rashes, lesions, or ulcerations  LABS:  BMET Recent Labs  Lab 06/09/18 1725  NA 135  K 4.0  CL 104  CO2 20*  BUN 22  CREATININE 1.72*  GLUCOSE 283*    Electrolytes Recent Labs  Lab 06/09/18 1725  CALCIUM 7.3*    CBC Recent Labs  Lab 06/09/18 1725  WBC 8.9  HGB 4.6*  HCT 13.3*  PLT 287    Coag's No results for input(s): APTT, INR in the last 168 hours.  Sepsis Markers No results for input(s): LATICACIDVEN, PROCALCITON, O2SATVEN in the last 168 hours.  ABG No results for input(s): PHART, PCO2ART, PO2ART in the last 168 hours.  Liver Enzymes Recent Labs  Lab 06/09/18 1725  AST 41  ALT 31  ALKPHOS 102  BILITOT 0.5  ALBUMIN 2.9*    Cardiac Enzymes Recent Labs  Lab 06/09/18 1725  TROPONINI <0.03    Glucose No results for input(s): GLUCAP in the last 168 hours.  Imaging Dg Chest 1 View  Result Date: 06/09/2018 CLINICAL DATA:  Dyspnea and wheezing EXAM: CHEST  1 VIEW COMPARISON:  04/17/2017 FINDINGS: Heart size is top-normal. Nonaneurysmal thoracic aorta is noted. Right IJ port catheter is noted with tip in the distal SVC-RA juncture. New bilateral in homogeneous airspace opacities are seen scattered throughout both lungs likely representing a component of mild pulmonary edema. Superimposed pneumonia would be difficult to entirely exclude especially at the lung bases. No significant pleural effusion identified. No pneumothorax. No acute osseous abnormality. IMPRESSION: Stable borderline cardiomegaly with patchy airspace  opacities suspicious for mild pulmonary edema. Superimposed pneumonia would be difficult to entirely exclude. These results were discussed at the time of interpretation on 06/09/2018 at 6:16 pm with Dr. Larae Grooms Electronically Signed   By: Ashley Royalty M.D.   On: 06/09/2018 18:16    STUDIES:  CT Abdomen &Pelvis wo Contrast >> New since prior exam are hypodense lesions of the liver, some which are noted in the right hepatic lobe ranging in size from 19 mm 26 mm with adjacent lobular soft tissue masslike abnormality abutting the gallbladder. Metastatic disease may account for this appearance as well as stigmata of peritoneal carcinomatosis and tumor implants in the omentum. Mildly enlarged right upper quadrant lymph node appears stable 15 mm series 2/69. Omental nodules persist in the left lower quadrant in are slightly better defined on current exam the prior the largest measuring up to 16 mm.  Interval decrease in upper abdominal ascites with small amount of ascites in pelvis. Right-sided double pigtail catheter ureteral stent is in  place without complicating features.  No evidence of bowel obstruction.  Postop change along the ventral abdomen and pelvis. Shallow omental fat and small bowel containing infraumbilical hernia without bowel obstruction. CT Chest wo contrast 06/09/18>> Diffuse ground-glass opacities are noted throughout the lungs with small 4 mm nodular density superimposed. These nodular densities are better visualized and noted in the left upper lobe and lingula. Given history of multiple transfusions a leading consideration is noncardiogenic pulmonary edema likely transfusion related within other differential possibilities including alveolar hemorrhage, interstitial pneumonia, ARDS. Non-contrast chest CT can be considered in 12 months pain high-risk patients.   CULTURES: Blood x2 06/09/18>> UA 06/09/18>>  ANTIBIOTICS: Vancomycin 9/24>> Cefepime 9/24>>  SIGNIFICANT EVENTS: 06/09/18>>  Admission to [facility] Stepdown  LINES/TUBES:  ASSESSMENT / PLAN:  PULMONARY A: Acute Hypoxic Respiratory Failure in setting of Pulmonary Edema vs ?HCAP vs.  ? Lymphangitic spread -CT Chest with diffuse ground glass opacities throughout the lungs with small 2 mm nodular density superimposed Hx: Asthma, URI's P:   Supplemental O2 to maintain O2 sats BiPAP, wean as tolerated-currently on room air doing well Received dose of Solu-medrol 125 mg x1 dose in ED Scheduled Bronchodilators-history of A. fib will DC DuoNeb and use Atrovent budesonide nebs Abx as above Consider Bronchoscopy- Case reviewed patient had a bronchoscopy done about 1 month ago, currently clinical picture is more suggestive of fluid overload plus minus sepsis, bronchoscopy will not yield or add anything, will continue with the current management follow-up with another CT scan in about 4 weeks and if continue to have issues consider bronchoscopy at that point Follow intermittent CXR  CARDIOVASCULAR A:  Acute on Chronic CHF Hx: HTN -BNP 282, Mild pulmonary edema on CXR P:  Cardiac monitoring Maintain MAP >65 Blood pressure is better we will start the patient on Lasix, patient also has history of A. fib will consider starting the patient back on Cardizem and Eliquis Obtain Echocardiogram -Start the patient back on Cardizem  RENAL A:   AKI Mild Anion Gap Metabolic Acidosis-improved current anion gap is normal P:   Monitor I&O's / urinary output Follow BMP Ensure adequate renal perfusion Avoid nephrotoxic agents as able Replace electrolytes as indicated Trend Lactic Acid   GASTROINTESTINAL A:   New liver lesions noted on CT Abdomen Hx:  P:   Keep NPO for now while on BiPAP-currently came off BiPAP and now that he started Pepcid for SUP GI & Oncology consults, appreciate input  HEMATOLOGIC A:   Symptomatic anemia without signs of bleeding Hx: P:  Monitor for s/sx of bleeding Trend CBC SCD's for VTE  Prophylaxis, consider restarting home Eliquis if hemoglobin remains stable by tomorrow Transfuse for Hgb<7 Pt to receive 2 units pRBCs, follow up H&H post transfusion As per the patient extensive work-up was done and no overt bleeding was noted most likely anemia is related to underlying chemotherapy and malignancy - We will send stool for occult blood further work-up per GI and oncology H&H q6h   INFECTIOUS A:   Left shift noted on CBC, WBC 8.9 Meets SIRS Criteria ? HCAP P:   Monitor fever curve Trend WBC's Trend Procalcitonin Follow cultures as above Continue Vancomycin & Cefepime Restart Valtrex  ENDOCRINE A:   Hyperglycemia  Diabetes Mellitus   P:   CBG's SSI Follow ICU Hypo/hyperglycemia protocol -The patient continues to remain hyperglycemic will consider starting the patient on Lantus  NEUROLOGIC A:   No acute issues Hx: Anxiety P:   Provide supportive care Lights on during  the day Promote normal wake/sleep cycle Avoid sedating meds as able Continue home Buspar & Prozac  FAMILY  - Updates: Updated pt at bedside 06/09/18.  Pt remains Full Code.  - Inter-disciplinary family meet or Palliative Care meeting due by:  06/16/18   Darel Hong, AGACNP-BC [place] [facility] Pager: [phone]  06/09/2018, 8:11 PM  STAFF NOTE: I, Dr Lahoma Rocker have personally reviewed patient's available data, including medical history, events of note, physical examination and test results as part of my evaluation. I have discussed with resident/NP and other care providers such as pharmacist, RN and RRT.  In addition,  I personally evaluated patient and elicited key findings of   Patient was independently seen chart reviewed examined history reviewed with the patient -Appropriate changes were made in the patient's note -Patient is overall doing well -Patient is awaiting bed to be transferred to [facility] however does not require intensive care unit bed at this  point and we will transfer to the floor patient Follow oncology and GI   Skin/Wound: chronic changes   Electrolytes: Replace electrolytes per ICU electrolyte replacement protocol.   IVF: none  Nutrition: diet as tolerated   Prophylaxis: DVT Prophylaxis with scd,. GI Prophylaxis.   Restraints: None  PT/OT eval and treat. OOB when appropriate.   Lines/Tubes:  No  foley  Mediport central line.  ADVANCE DIRECTIVE:Full code  FAMILY DISCUSSION:Spoke with patient   Quality Care: PPI, DVT prophylaxis, HOB elevated, Infection control all reviewed and addressed.  Events and notes from last 24 hours reviewed. Care plan discussed on multidisciplinary rounds  CC TIME:38 min    Old records reviewed discussed results and management plan with patient  Images personally reviewed and results and labs reviewed and discussed with patient.  All medication reviewed and adjusted  Further management depending on test results and work up as outlined above.    Lahoma Rocker, M.D

## 2018-06-09 NOTE — Progress Notes (Signed)
Transported pt to CT then to ICU 14 on Bipap without incident. Pt remains on Bipap and tol well. Report given to ICU RT.

## 2018-06-09 NOTE — Progress Notes (Signed)
CODE SEPSIS - PHARMACY COMMUNICATION  **Broad Spectrum Antibiotics should be administered within 1 hour of Sepsis diagnosis**  Time Code Sepsis Called/Page Received: @ 1902  Antibiotics Ordered: Cefepime                                      Vancomycin                                       Flagyl   Time of 1st antibiotic administration: @ 1947  Additional action taken by pharmacy: Contacted nurse @ 1930 about time left to start Abx.   Pernell Dupre, PharmD, BCPS Clinical Pharmacist 06/09/2018 8:02 PM

## 2018-06-09 NOTE — H&P (Signed)
[facility] [place] NAME: [name]    MR#:  [id number]  DATE OF BIRTH:  04/08/1951  DATE OF ADMISSION:  06/09/2018  PRIMARY CARE PHYSICIAN: Lucille Passy, MD   REQUESTING/REFERRING PHYSICIAN: Orbie Pyo, MD  CHIEF COMPLAINT:  Shortness of breath HISTORY OF PRESENT ILLNESS:  [name]  is a 67 y.o. 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  Patient was saturating only 50% in the field when EMS placed to the field.  Patient was placed on CPAP and she was saturating at mid 80s and she is brought into the emergency department.  Chest x-ray with patchy airspace opacities and pulmonary edema.  Hemoglobin is at 4.3.  Patient denies any active bleeding.  2 units of blood transition was ordered by the emergency department physician.  Patient feels better on BiPAP in the emergency department.  Husband and 2 sons are at bedside.  The plan is to transfer the patient to [facility] once bed is available at [place]:   Past Medical History:  Diagnosis Date  . Acute upper respiratory infections of unspecified site   . Anemia, unspecified   . Anxiety state, unspecified   . Cellulitis   . Chronic diastolic heart failure ([place])   . Congestive heart failure, unspecified   . Contact dermatitis and other eczema, due to unspecified cause   . Depressive disorder, not elsewhere classified   . Diabetes mellitus   . Essential hypertension, benign   . Mastoiditis   . Other and unspecified hyperlipidemia   . Other diseases of lung, not elsewhere classified   . Other dyspnea and respiratory abnormality   . Recent retinal detachment,  partial, with giant tear   . Type II or unspecified type diabetes mellitus with peripheral circulatory disorders, not stated as uncontrolled(250.70)   . Unspecified asthma, with exacerbation   . Unspecified urinary incontinence   . Wheezing     PAST SURGICAL HISTOIRY:   Past Surgical History:  Procedure Laterality Date  . ABDOMINAL HYSTERECTOMY    . DILATION AND CURETTAGE OF UTERUS    . EYE SURGERY     Left retina repair- 11/09, 02-20-09  . KNEE SURGERY  2003   meniscus  . LAPAROSCOPIC GASTRIC BANDING  08/21/10    SOCIAL HISTORY:   Social History   Tobacco Use  . Smoking status: Never Smoker  . Smokeless tobacco: Never Used  Substance Use Topics  . Alcohol use: No    Alcohol/week: 0.0 standard drinks    FAMILY HISTORY:   Family History  Problem Relation Age of Onset  . Cancer Mother        breast  . Cancer Sister        breast  . Cancer Maternal Aunt        breast  . Cancer Maternal Uncle        breast  . Cancer Maternal Grandmother        breast  . Cancer Maternal Uncle     DRUG ALLERGIES:   Allergies  Allergen Reactions  . Aspirin Other (See Comments)    asthma  . Lorazepam Shortness Of Breath  . Codeine Nausea And Vomiting    REVIEW OF SYSTEMS:  Review of  system limited as the patient is on BiPAP Constitutional denies any fever or chills ENT  reporting dry mouth from BiPAP  Respiratory patient is on BiPAP reporting dry mouth but shortness of breath is getting better Cardiovascular denies any chest pain GI denies any nausea vomiting   MEDICATIONS AT HOME:   Prior to Admission medications   Medication Sig Start Date End Date Taking? Authorizing Provider  amiodarone (PACERONE) 200 MG tablet Take 200 mg by mouth daily. 04/12/17 04/12/18  [provider]  amLODipine (NORVASC) 5 MG tablet Take 5 mg by mouth daily. 03/20/17 03/20/18  [provider]  apixaban (ELIQUIS) 5 MG TABS tablet Take 5 mg by mouth 2 (two) times daily.    [provider]  atorvastatin (LIPITOR) 40 MG tablet Take 40 mg by mouth daily.    [provider]  Blood Glucose Monitoring Suppl (ONE TOUCH ULTRA 2) w/Device KIT Check blood sugar three times a day and as directed. Dx M35.597 07/18/16   Lucille Passy, MD  busPIRone (BUSPAR) 10 MG tablet TAKE 1 TABLET BY MOUTH AT BEDTIME 09/03/17   Lucille Passy, MD  cycloSPORINE (RESTASIS) 0.05 % ophthalmic emulsion Place 1 drop into both eyes 2 (two) times daily.    [provider]  diltiazem (CARDIZEM CD) 240 MG 24 hr capsule Take 240 mg by mouth daily. 04/12/17 04/12/18  [provider]  doxycycline (VIBRA-TABS) 100 MG tablet Take 1 tablet (100 mg total) by mouth 2 (two) times daily. 09/22/17   Lucille Passy, MD  FLUoxetine (PROZAC) 20 MG capsule TAKE 3 CAPSULES BY MOUTH ONCE DAILY 04/07/18   Lucille Passy, MD  furosemide (LASIX) 20 MG tablet Take 1 tablet (20 mg total) by mouth every morning. 12/18/17   Lucille Passy, MD  glipiZIDE (GLUCOTROL) 5 MG tablet Take 1 tablet (5 mg total) by mouth 2 (two) times daily before a meal. 11/18/17   Lucille Passy, MD  glipiZIDE (GLUCOTROL) 5 MG tablet TAKE 1 TABLET BY MOUTH TWICE DAILY BEFORE MEAL(S) 05/27/18   Lucille Passy, MD  glucose blood (ONE TOUCH ULTRA TEST) test strip USE ONE STRIP TO CHECK GLUCOSE THREE TIMES DAILY AND  AS  DIRECTED 03/09/18   Lucille Passy, MD  LANTUS 100 UNIT/ML injection INJECT 30 UNITS SUBCUTANEOUSLY IN THE EVENING 05/01/18   Lucille Passy, MD  lisinopril (PRINIVIL,ZESTRIL) 20 MG tablet Take 1 tablet (20 mg total) by mouth daily. 09/04/17 09/04/18  Lucille Passy, MD  metFORMIN (GLUCOPHAGE) 500 MG tablet TAKE 1 TABLET BY MOUTH TWICE DAILY WITH MEALS 05/27/18   Lucille Passy, MD  ondansetron (ZOFRAN) 8 MG tablet Take 1 tablet (8 mg total) by mouth every 8 (eight) hours as needed for nausea or vomiting. Will not make drowsy. 04/07/15   Gordy Levan, MD  ONETOUCH DELICA LANCETS FINE MISC USE TO TEST BLOOD SUGAR THREE TIMES DAILY AND  AS DIRECTED 12/05/17   Lucille Passy, MD  oxyCODONE (OXY IR/ROXICODONE) 5 MG immediate release tablet Take 5 mg by mouth every 4 (four) hours as needed. 03/20/17   [provider]  prochlorperazine (COMPAZINE) 10 MG tablet Take 10 mg by mouth every 6 (six) hours as needed. 06/20/17   [provider]  ranitidine (ZANTAC) 150 MG tablet TAKE 1 TABLET BY MOUTH TWICE DAILY 07/30/17   Lucille Passy, MD      VITAL SIGNS:  Blood pressure (!) 103/51, pulse 96, temperature 97.9 F (36.6 C), temperature  source Axillary, resp. rate 17, weight 110 kg, SpO2 99 %.  PHYSICAL EXAMINATION:  GENERAL:  67 y.o.-year-old patient lying in the bed with no acute distress.  EYES: Pupils equal, round, reactive to light and accommodation. No scleral icterus. Extraocular muscles intact.  HEENT: Head atraumatic, normocephalic. Oropharynx and nasopharynx clear.  Pale looking NECK:  Supple, no jugular venous distention. No thyroid enlargement, no tenderness.  LUNGS: Diminished breath sounds bilaterally, no wheezing, positive rales rhonchi and crepitations   No use of accessory muscles of respiration.  CARDIOVASCULAR: S1, S2 normal. No murmurs, rubs, or gallops.  ABDOMEN: Soft, nontender, nondistended. Bowel sounds present. No organomegaly or mass.  EXTREMITIES: No pedal edema, cyanosis, or clubbing.  NEUROLOGIC: Cranial nerves II through XII are intact. . Sensation intact. Gait not checked.  PSYCHIATRIC: The patient is alert and oriented x 3.  SKIN: No obvious rash, lesion, or ulcer.   LABORATORY PANEL:   CBC Recent Labs  Lab 06/09/18 1725  WBC 8.9  HGB 4.6*  HCT 13.3*  PLT 287   ------------------------------------------------------------------------------------------------------------------  Chemistries  Recent Labs  Lab 06/09/18 1725  NA 135  K 4.0  CL 104  CO2 20*  GLUCOSE 283*  BUN 22  CREATININE 1.72*  CALCIUM 7.3*  AST 41  ALT 31  ALKPHOS 102  BILITOT 0.5    ------------------------------------------------------------------------------------------------------------------  Cardiac Enzymes Recent Labs  Lab 06/09/18 1725  TROPONINI <0.03   ------------------------------------------------------------------------------------------------------------------  RADIOLOGY:  Dg Chest 1 View  Result Date: 06/09/2018 CLINICAL DATA:  Dyspnea and wheezing EXAM: CHEST  1 VIEW COMPARISON:  04/17/2017 FINDINGS: Heart size is top-normal. Nonaneurysmal thoracic aorta is noted. Right IJ port catheter is noted with tip in the distal SVC-RA juncture. New bilateral in homogeneous airspace opacities are seen scattered throughout both lungs likely representing a component of mild pulmonary edema. Superimposed pneumonia would be difficult to entirely exclude especially at the lung bases. No significant pleural effusion identified. No pneumothorax. No acute osseous abnormality. IMPRESSION: Stable borderline cardiomegaly with patchy airspace opacities suspicious for mild pulmonary edema. Superimposed pneumonia would be difficult to entirely exclude. These results were discussed at the time of interpretation on 06/09/2018 at 6:16 pm with Dr. Larae Grooms Electronically Signed   By: Ashley Royalty M.D.   On: 06/09/2018 18:16    EKG:   Orders placed or performed during the hospital encounter of 06/09/18  . EKG 12-Lead  . EKG 12-Lead    IMPRESSION AND PLAN:     #Acute hypoxic respiratory failure secondary to healthcare associated pneumonia and pulmonary edema Admit to intensive care unit BiPAP Empiric IV antibiotics cefepime and vancomycin Pulmonology consult placed and awaiting callback  #Symptomatic anemia-on chronic anemia probably chemotherapy induced Hemoglobin at 4.3 Patient denies any active bleeding Check stool for occult blood Monitor hemoglobin hematocrit closely   #Metastatic ovarian cancer stage IV Currently on chemotherapy is at [facility] cancer  center Patient will be transferred to [facility] when bed is available, on waiting list  #Diabetes mellitus insulin requiring Currently patient is n.p.o. as she is on BiPAP Sliding scale insulin  #acute on ch diastolic chf  Currently patient is on BiPAP and n.p.o. Monitor intake and output Hold home medications by mouth   Patient will be transferred to [facility] hospital once bed is available   All the records are reviewed and case discussed with ED provider. Management plans discussed with the patient, husband and 2 sons at bedside they all verbalized understanding of the plan   CODE STATUS: fc ,husband  is hcpoa   TOTAL critical care TIME TAKING CARE OF THIS PATIENT: 45 minutes.   Note: This dictation was prepared with Dragon dictation along with smaller phrase technology. Any transcriptional errors that result from this process are unintentional.  Nicholes Mango M.D on 06/09/2018 at 7:10 PM  Between 7am to 6pm - Pager - [phone]  After 6pm go to [email or web address] - password [email or web address] [place] [facility]  Office  [phone]  CC: Primary care physician; Lucille Passy, MD

## 2018-06-09 NOTE — ED Notes (Signed)
Family at bedside. 

## 2018-06-09 NOTE — ED Provider Notes (Addendum)
[facility] Emergency Department Provider Note ____________________________________________   First MD Initiated Contact with Patient 06/09/18 1726     (approximate)  I have reviewed the triage vital signs and the nursing notes.   HISTORY  Chief Complaint Respiratory Distress  HPI [name] is a 67 y.o. female with a history of diastolic heart failure, metastatic ovarian cancer, chronic anemia status post multiple transfusions and asthma who was presented to emergency department with respiratory distress.  EMS reports the patient was 50% in the field and that they can only get the patient up to the low to mid 80s on CPAP.  Patient also with history of asthma.  Patient states that she has been having worsening shortness of breath over the past week but without any pain associated.  Patient says that she also takes Lasix and has been compliant.  Past Medical History:  Diagnosis Date  . Acute upper respiratory infections of unspecified site   . Anemia, unspecified   . Anxiety state, unspecified   . Cellulitis   . Chronic diastolic heart failure ([place])   . Congestive heart failure, unspecified   . Contact dermatitis and other eczema, due to unspecified cause   . Depressive disorder, not elsewhere classified   . Diabetes mellitus   . Essential hypertension, benign   . Mastoiditis   . Other and unspecified hyperlipidemia   . Other diseases of lung, not elsewhere classified   . Other dyspnea and respiratory abnormality   . Recent retinal detachment, partial, with giant tear   . Type II or unspecified type diabetes mellitus with peripheral circulatory disorders, not stated as uncontrolled(250.70)   . Unspecified asthma, with exacerbation   . Unspecified urinary incontinence   . Wheezing     Patient Active Problem List   Diagnosis Date Noted  . Cellulitis of left ear 09/22/2017  . Atrial flutter ([place]) 04/17/2017  . Edema 09/02/2016  . Need for home  health care 07/17/2015  . Hyperglycemia 04/13/2015  . Hyperkalemia 04/13/2015  . Ovarian cancer ([place]) 04/07/2015  . Iron deficiency anemia 04/07/2015  . Pelvic mass in female   . Peritoneal carcinomatosis ([place])   . Dizziness and giddiness 08/09/2014  . OA (osteoarthritis) of finger 03/24/2014  . OBESITY, UNSPECIFIED 12/29/2009  . POSTMENOPAUSAL STATUS 08/09/2009  . RETINAL DETACHMENT 05/10/2009  . DIASTOLIC HEART FAILURE, CHRONIC 02/28/2009  . DYSPNEA ON EXERTION 02/28/2009  . RECENT RETINAL DETACHMENT PARTIAL W/GIANT TEAR 07/24/2008  . ANEMIA 12/08/2007  . ASTHMA, WITH ACUTE EXACERBATION 12/08/2007  . HYPERTENSION, BENIGN 11/03/2007  . Diabetes ([place]) 05/05/2007  . HLD (hyperlipidemia) 05/05/2007  . Anxiety state 05/05/2007  . DEPRESSION 05/05/2007  . Congestive heart failure ([place]) 01/20/2007  . LUNG NODULE 01/20/2007    Past Surgical History:  Procedure Laterality Date  . ABDOMINAL HYSTERECTOMY    . DILATION AND CURETTAGE OF UTERUS    . EYE SURGERY     Left retina repair- 11/09, 02-20-09  . KNEE SURGERY  2003   meniscus  . LAPAROSCOPIC GASTRIC BANDING  08/21/10    Prior to Admission medications   Medication Sig Start Date End Date Taking? Authorizing Provider  amiodarone (PACERONE) 200 MG tablet Take 200 mg by mouth daily. 04/12/17 04/12/18  [provider]  amLODipine (NORVASC) 5 MG tablet Take 5 mg by mouth daily. 03/20/17 03/20/18  [provider]  apixaban (ELIQUIS) 5 MG TABS tablet Take 5 mg by mouth 2 (two) times daily.    [provider]  atorvastatin (LIPITOR) 40 MG tablet Take 40 mg by mouth daily.    [provider]  Blood Glucose Monitoring Suppl (ONE TOUCH ULTRA 2) w/Device KIT Check blood sugar three times a day and as directed. Dx E11.319 07/18/16   Aron, Talia M, MD  busPIRone (BUSPAR) 10 MG tablet TAKE 1 TABLET BY MOUTH AT BEDTIME 09/03/17   Aron, Talia M, MD  cycloSPORINE (RESTASIS) 0.05 % ophthalmic emulsion Place 1 drop into  both eyes 2 (two) times daily.    [provider]  diltiazem (CARDIZEM CD) 240 MG 24 hr capsule Take 240 mg by mouth daily. 04/12/17 04/12/18  [provider]  doxycycline (VIBRA-TABS) 100 MG tablet Take 1 tablet (100 mg total) by mouth 2 (two) times daily. 09/22/17   Aron, Talia M, MD  FLUoxetine (PROZAC) 20 MG capsule TAKE 3 CAPSULES BY MOUTH ONCE DAILY 04/07/18   Aron, Talia M, MD  furosemide (LASIX) 20 MG tablet Take 1 tablet (20 mg total) by mouth every morning. 12/18/17   Aron, Talia M, MD  glipiZIDE (GLUCOTROL) 5 MG tablet Take 1 tablet (5 mg total) by mouth 2 (two) times daily before a meal. 11/18/17   Aron, Talia M, MD  glipiZIDE (GLUCOTROL) 5 MG tablet TAKE 1 TABLET BY MOUTH TWICE DAILY BEFORE MEAL(S) 05/27/18   Aron, Talia M, MD  glucose blood (ONE TOUCH ULTRA TEST) test strip USE ONE STRIP TO CHECK GLUCOSE THREE TIMES DAILY AND  AS  DIRECTED 03/09/18   Aron, Talia M, MD  LANTUS 100 UNIT/ML injection INJECT 30 UNITS SUBCUTANEOUSLY IN THE EVENING 05/01/18   Aron, Talia M, MD  lisinopril (PRINIVIL,ZESTRIL) 20 MG tablet Take 1 tablet (20 mg total) by mouth daily. 09/04/17 09/04/18  Aron, Talia M, MD  metFORMIN (GLUCOPHAGE) 500 MG tablet TAKE 1 TABLET BY MOUTH TWICE DAILY WITH MEALS 05/27/18   Aron, Talia M, MD  ondansetron (ZOFRAN) 8 MG tablet Take 1 tablet (8 mg total) by mouth every 8 (eight) hours as needed for nausea or vomiting. Will not make drowsy. 04/07/15   Livesay, Lennis P, MD  ONETOUCH DELICA LANCETS FINE MISC USE TO TEST BLOOD SUGAR THREE TIMES DAILY AND AS DIRECTED 12/05/17   Aron, Talia M, MD  oxyCODONE (OXY IR/ROXICODONE) 5 MG immediate release tablet Take 5 mg by mouth every 4 (four) hours as needed. 03/20/17   [provider]  prochlorperazine (COMPAZINE) 10 MG tablet Take 10 mg by mouth every 6 (six) hours as needed. 06/20/17   [provider]  ranitidine (ZANTAC) 150 MG tablet TAKE 1 TABLET BY MOUTH TWICE DAILY 07/30/17   Aron, Talia M, MD     Allergies Aspirin; Lorazepam; and Codeine  Family History  Problem Relation Age of Onset  . Cancer Mother        breast  . Cancer Sister        breast  . Cancer Maternal Aunt        breast  . Cancer Maternal Uncle        breast  . Cancer Maternal Grandmother        breast  . Cancer Maternal Uncle     Social History Social History   Tobacco Use  . Smoking status: Never Smoker  . Smokeless tobacco: Never Used  Substance Use Topics  . Alcohol use: No    Alcohol/week: 0.0 standard drinks  . Drug use: No    Review of Systems  Constitutional: No fever/chills Eyes: No visual changes. ENT: No sore throat.   Cardiovascular: Denies chest pain. Respiratory: As above. Gastrointestinal: No abdominal pain.  No nausea, no vomiting.  No diarrhea.  No constipation. Genitourinary: Negative for dysuria. Musculoskeletal: Negative for back pain. Skin: Negative for rash. Neurological: Negative for headaches, focal weakness or numbness.   ____________________________________________   PHYSICAL EXAM:  VITAL SIGNS: ED Triage Vitals  Enc Vitals Group     BP 06/09/18 1730 117/60     Pulse Rate 06/09/18 1723 (!) 118     Resp 06/09/18 1723 (!) 26     Temp 06/09/18 1730 97.9 F (36.6 C)     Temp Source 06/09/18 1730 Axillary     SpO2 06/09/18 1723 99 %     Weight 06/09/18 1724 242 lb 8.1 oz (110 kg)     Height --      Head Circumference --      Peak Flow --      Pain Score 06/09/18 1724 0     Pain Loc --      Pain Edu? --      Excl. in [place]? --     Constitutional: Alert and oriented.  Patient with respiratory distress with labored respirations on CPAP.  Transferred over to BiPAP.  Able speak in 3-4 word sentences. Eyes: Conjunctivae are normal.  Head: Atraumatic. Nose: No congestion/rhinnorhea. Mouth/Throat: Mucous membranes are moist.  Neck: No stridor.   Cardiovascular: Tachycardic, regular rhythm. Grossly normal heart sounds.   Respiratory: Tachypnea with labored  respirations.  Rhonchorous respirations throughout with slightly prolonged expiratory phase. Gastrointestinal: Soft and nontender. No distention. Musculoskeletal: Minimal bilateral lower extremity edema. Neurologic:  Normal speech and language. No gross focal neurologic deficits are appreciated. Skin:  Skin is warm, dry and intact. No rash noted. Psychiatric: Mood and affect are normal. Speech and behavior are normal.  ____________________________________________   LABS (all labs ordered are listed, but only abnormal results are displayed)  Labs Reviewed  CBC WITH DIFFERENTIAL/PLATELET - Abnormal; Notable for the following components:      Result Value   RBC 1.42 (*)    Hemoglobin 4.6 (*)    HCT 13.3 (*)    RDW 17.3 (*)    Neutro Abs 7.0 (*)    All other components within normal limits  COMPREHENSIVE METABOLIC PANEL - Abnormal; Notable for the following components:   CO2 20 (*)    Glucose, Bld 283 (*)    Creatinine, Ser 1.72 (*)    Calcium 7.3 (*)    Albumin 2.9 (*)    GFR calc non Af Amer 29 (*)    GFR calc Af Amer 34 (*)    All other components within normal limits  BRAIN NATRIURETIC PEPTIDE - Abnormal; Notable for the following components:   B Natriuretic Peptide 282.0 (*)    All other components within normal limits  TROPONIN I  PREPARE RBC (CROSSMATCH)  TYPE AND SCREEN  ABO/RH   ____________________________________________  EKG  ED ECG REPORT I, Doran Stabler, the attending physician, personally viewed and interpreted this ECG.   Date: 06/09/2018  EKG Time: 1727  Rate: 114  Rhythm: Junctional tachycardia versus atrial fibrillation.  Axis: Normal  Intervals:left bundle branch block  ST&T Change: No ST segment elevation or depression.  Single T wave inversion in V6.  EKG reads from care everywhere read left bundle branch block from 2019 in August. ____________________________________________  RADIOLOGY  Borderline cardiomegaly with patchy airspace  opacities suspicious for mild pulmonary edema.  Superimposed pneumonia would be difficult to entirely exclude. ____________________________________________  PROCEDURES  Procedure(s) performed:   .Critical Care Performed by: ,  Matthew, MD Authorized by: ,  Matthew, MD   Critical care provider statement:    Critical care time (minutes):  45   Critical care was necessary to treat or prevent imminent or life-threatening deterioration of the following conditions:  Respiratory failure and circulatory failure   Critical care was time spent personally by me on the following activities:  Discussions with consultants, evaluation of patient's response to treatment, examination of patient, ordering and performing treatments and interventions, ordering and review of laboratory studies, ordering and review of radiographic studies, pulse oximetry, re-evaluation of patient's condition, obtaining history from patient or surrogate and review of old charts    Critical Care performed:   ____________________________________________   INITIAL IMPRESSION / ASSESSMENT AND PLAN / ED COURSE  Pertinent labs & imaging results that were available during my care of the patient were reviewed by me and considered in my medical decision making (see chart for details).  Differential includes, but is not limited to, viral syndrome, bronchitis including COPD exacerbation, pneumonia, reactive airway disease including asthma, CHF including exacerbation with or without pulmonary/interstitial edema, pneumothorax, ACS, thoracic trauma, and pulmonary embolism. As part of my medical decision making, I reviewed the following data within the electronic medical record:  Notes from prior ED visits  ----------------------------------------- 7:01 PM on 06/09/2018 -----------------------------------------  Patient at this time much improved.  However, with prolonged respiratory phase and wheezing  throughout all fields.  Left shift on blood count but without an elevated white blood cell count.  However, the patient is a cancer patient on chemotherapy and also will have a degree of immunosuppression.  Patient to be treated for pneumonia as well as require blood transfusion.  Attempted to contact her oncology team at [facility].  I was able to discuss the case with the transfer center as the family had requested transfer to [facility] as the patient has her ongoing care there.  However, [facility] says that they are at capacity, especially for patient on BiPAP and the patient would need to be placed on a waiting list.  I discussed with the family the need to be admitted overnight and continue efforts to transfer to [facility] as we administer inpatient care here.  Patient and family agree with this plan.  Understand the diagnosis as well as treatment and willing to comply.  Unable to roll patient at this time for rectal exam.  However, she reports her stool is being brown and grossly nonbloody.  Signed out to Dr. Gouru.  ____________________________________________   FINAL CLINICAL IMPRESSION(S) / ED DIAGNOSES  Hospital-acquired pneumonia.  Bronchospasm.  Symptom medic anemia  NEW MEDICATIONS STARTED DURING THIS VISIT:  New Prescriptions   No medications on file     Note:  This document was prepared using Dragon voice recognition software and may include unintentional dictation errors.     ,  Matthew, MD 06/09/18 1905    ,  Matthew, MD 06/09/18 1907  Discussed the case with oncologist, Dr. Allison Puechl, at [facility].  She recommends a CT of the chest abdomen and pelvis.  She says that she will be happy to accept the patient onto her service pending bed availability.  Agrees with the plan as discussed for pneumonia, bronchospasm as well as anemia treatment.    ,  Matthew, MD 06/09/18 1914  

## 2018-06-09 NOTE — ED Notes (Addendum)
EDP Schaevitz made aware of critical results hemoglobin 4.6 and hematocrit 13.3%

## 2018-06-09 NOTE — ED Triage Notes (Signed)
PT to ED via EMS from home with acute onset of SOB, per EMS pt 50% on RA. PT placed on cpap in route and up to 80s%.  Pt hx of cancer and CHF. PT given 3 nitro, denies any pain. MD and RT at bedside

## 2018-06-09 NOTE — ED Notes (Signed)
Blood entered the Pt's blood stream.  

## 2018-06-10 ENCOUNTER — Inpatient Hospital Stay (HOSPITAL_COMMUNITY)
Admit: 2018-06-10 | Discharge: 2018-06-10 | Disposition: A | Discharge status: Home / Self Care | Payer: Medicare Other | Attending: Pulmonary Disease | Admitting: Pulmonary Disease

## 2018-06-10 DIAGNOSIS — I509 Heart failure, unspecified: Secondary | ICD-10-CM

## 2018-06-10 DIAGNOSIS — C799 Secondary malignant neoplasm of unspecified site: Secondary | ICD-10-CM

## 2018-06-10 DIAGNOSIS — Z7189 Other specified counseling: Secondary | ICD-10-CM

## 2018-06-10 DIAGNOSIS — J9601 Acute respiratory failure with hypoxia: Secondary | ICD-10-CM

## 2018-06-10 DIAGNOSIS — J189 Pneumonia, unspecified organism: Secondary | ICD-10-CM

## 2018-06-10 DIAGNOSIS — C569 Malignant neoplasm of unspecified ovary: Secondary | ICD-10-CM

## 2018-06-10 DIAGNOSIS — E785 Hyperlipidemia, unspecified: Secondary | ICD-10-CM

## 2018-06-10 DIAGNOSIS — J810 Acute pulmonary edema: Secondary | ICD-10-CM

## 2018-06-10 DIAGNOSIS — I350 Nonrheumatic aortic (valve) stenosis: Secondary | ICD-10-CM

## 2018-06-10 DIAGNOSIS — E119 Type 2 diabetes mellitus without complications: Secondary | ICD-10-CM

## 2018-06-10 DIAGNOSIS — Z6841 Body Mass Index (BMI) 40.0 and over, adult: Secondary | ICD-10-CM

## 2018-06-10 DIAGNOSIS — D649 Anemia, unspecified: Secondary | ICD-10-CM

## 2018-06-10 DIAGNOSIS — J9801 Acute bronchospasm: Secondary | ICD-10-CM

## 2018-06-10 DIAGNOSIS — E669 Obesity, unspecified: Secondary | ICD-10-CM

## 2018-06-10 DIAGNOSIS — R0602 Shortness of breath: Secondary | ICD-10-CM

## 2018-06-10 LAB — URINALYSIS, ROUTINE W REFLEX MICROSCOPIC
Bilirubin Urine: NEGATIVE
Glucose, UA: >=500 mg/dL — AB
Ketones, ur: NEGATIVE mg/dL
LEUKOCYTES UA: NEGATIVE
Nitrite: NEGATIVE
PROTEIN: 100 mg/dL — AB
RBC / HPF: >50 RBC/hpf — ABNORMAL HIGH (ref 0–5)
Specific Gravity, Urine: 1.015 (ref 1.005–1.030)
pH: 5 (ref 5.0–8.0)

## 2018-06-10 LAB — TYPE AND SCREEN
ABO/RH(D): A POS
Antibody Screen: NEGATIVE
UNIT DIVISION: 0
Unit division: 0

## 2018-06-10 LAB — COMPREHENSIVE METABOLIC PANEL
ALBUMIN: 2.5 g/dL — AB (ref 3.5–5.0)
ALK PHOS: 85 U/L (ref 38–126)
ALT: 22 U/L (ref 0–44)
AST: 21 U/L (ref 15–41)
Anion gap: 9 (ref 5–15)
BILIRUBIN TOTAL: 0.6 mg/dL (ref 0.3–1.2)
BUN: 26 mg/dL — ABNORMAL HIGH (ref 8–23)
CALCIUM: 7.4 mg/dL — AB (ref 8.9–10.3)
CO2: 24 mmol/L (ref 22–32)
CREATININE: 1.68 mg/dL — AB (ref 0.44–1.00)
Chloride: 102 mmol/L (ref 98–111)
GFR calc Af Amer: 35 mL/min — ABNORMAL LOW (ref >60)
GFR calc non Af Amer: 30 mL/min — ABNORMAL LOW (ref >60)
Glucose, Bld: 382 mg/dL — ABNORMAL HIGH (ref 70–99)
Potassium: 5 mmol/L (ref 3.5–5.1)
Sodium: 135 mmol/L (ref 135–145)
TOTAL PROTEIN: 5.9 g/dL — AB (ref 6.5–8.1)

## 2018-06-10 LAB — LACTIC ACID, PLASMA: LACTIC ACID, VENOUS: 1.6 mmol/L (ref 0.5–1.9)

## 2018-06-10 LAB — BPAM RBC
Blood Product Expiration Date: 201910202359
Blood Product Expiration Date: 201910202359
ISSUE DATE / TIME: 201909241906
ISSUE DATE / TIME: 201909241906
UNIT TYPE AND RH: 6200
Unit Type and Rh: 6200

## 2018-06-10 LAB — HEMOGLOBIN AND HEMATOCRIT, BLOOD
HCT: 28.6 % — ABNORMAL LOW (ref 35.0–47.0)
HEMOGLOBIN: 9.7 g/dL — AB (ref 12.0–16.0)

## 2018-06-10 LAB — CBC
HCT: 26.5 % — ABNORMAL LOW (ref 35.0–47.0)
Hemoglobin: 9.1 g/dL — ABNORMAL LOW (ref 12.0–16.0)
MCH: 31.4 pg (ref 26.0–34.0)
MCHC: 34.4 g/dL (ref 32.0–36.0)
MCV: 91.3 fL (ref 80.0–100.0)
PLATELETS: 243 10*3/uL (ref 150–440)
RBC: 2.9 MIL/uL — ABNORMAL LOW (ref 3.80–5.20)
RDW: 16.2 % — AB (ref 11.5–14.5)
WBC: 5.9 10*3/uL (ref 3.6–11.0)

## 2018-06-10 LAB — HEMOGLOBIN A1C
Hgb A1c MFr Bld: 6.4 % — ABNORMAL HIGH (ref 4.8–5.6)
Mean Plasma Glucose: 136.98 mg/dL

## 2018-06-10 LAB — ECHOCARDIOGRAM COMPLETE
Height: 65 in
WEIGHTICAEL: 3908.31 [oz_av]

## 2018-06-10 LAB — GLUCOSE, CAPILLARY
GLUCOSE-CAPILLARY: 233 mg/dL — AB (ref 70–99)
GLUCOSE-CAPILLARY: 341 mg/dL — AB (ref 70–99)
GLUCOSE-CAPILLARY: 375 mg/dL — AB (ref 70–99)
Glucose-Capillary: 228 mg/dL — ABNORMAL HIGH (ref 70–99)
Glucose-Capillary: 210 mg/dL — ABNORMAL HIGH (ref 70–99)

## 2018-06-10 LAB — PROCALCITONIN: PROCALCITONIN: 0.73 ng/mL

## 2018-06-10 MED ORDER — INSULIN GLARGINE 100 UNIT/ML ~~LOC~~ SOLN
30.0000 [IU] | Freq: Every day | SUBCUTANEOUS | Status: DC
  Administered 2018-06-10: 30 [IU] via SUBCUTANEOUS
  Filled 2018-06-10 (×3): qty 0.3

## 2018-06-10 MED ORDER — FUROSEMIDE 10 MG/ML IJ SOLN
60.0000 mg | Freq: Two times a day (BID) | INTRAMUSCULAR | Status: AC
  Administered 2018-06-10 – 2018-06-11 (×3): 60 mg via INTRAVENOUS
  Filled 2018-06-10 (×3): qty 6

## 2018-06-10 MED ORDER — VALACYCLOVIR HCL 500 MG PO TABS
1000.0000 mg | ORAL_TABLET | Freq: Every day | ORAL | Status: DC
  Administered 2018-06-10 – 2018-06-11 (×2): 1000 mg via ORAL
  Filled 2018-06-10 (×2): qty 2

## 2018-06-10 MED ORDER — DILTIAZEM HCL ER COATED BEADS 120 MG PO CP24
240.0000 mg | ORAL_CAPSULE | Freq: Every day | ORAL | Status: DC
  Administered 2018-06-10 – 2018-06-11 (×2): 240 mg via ORAL
  Filled 2018-06-10: qty 2
  Filled 2018-06-10: qty 1

## 2018-06-10 MED ORDER — AMIODARONE HCL 200 MG PO TABS
200.0000 mg | ORAL_TABLET | Freq: Every day | ORAL | Status: DC
  Administered 2018-06-10 – 2018-06-11 (×2): 200 mg via ORAL
  Filled 2018-06-10 (×2): qty 1

## 2018-06-10 MED ORDER — APIXABAN 5 MG PO TABS: 5.0000 mg | ORAL_TABLET | Freq: Two times a day (BID) | ORAL | Status: DC

## 2018-06-10 MED ORDER — IPRATROPIUM BROMIDE 0.02 % IN SOLN
0.5000 mg | Freq: Four times a day (QID) | RESPIRATORY_TRACT | Status: DC
  Administered 2018-06-10 – 2018-06-11 (×4): 0.5 mg via RESPIRATORY_TRACT
  Filled 2018-06-10 (×5): qty 2.5

## 2018-06-10 NOTE — Evaluation (Signed)
Occupational Therapy Evaluation Patient Details Name: [name] MRN: [id number] DOB: 1951-08-06 Today's Date: 06/10/2018    History of Present Illness presented to ER secondary to respiratory distress; admitted secondary to acute/chronic respiratory failure secondary to HCAP, pulmonary edema and symptomatic anemia (initial HgB 4.3, current 9.1).  Initially requiring BiPAP, now weaned to 2L supplemental O2 via [place] and then on room air by afternoon of 06/10/18.   Clinical Impression   Pt is 67 year old female who presents to [facility] hospital in acute respiratory distress secondary to respiratory failure secondary to HCAP and pulmonary edema. She is supposed to start chemotherapy on Friday at [facility] for metastatic ovarian cancer.  Pt currently requires set up for grooming and feeding and mod to max assist for LB dressing and bathing due to SOB, decreased endurance for functional tasks and at risk for falls. Education about purse lip breathing and using energy conservation tech initiated and rec LB dressing skills using reacher, LH shoe horn and sock aid to help decrease SOB.  Reviewed deep breathing exercises to help learn how to utilize O2 better for ADLs with teach back.  O2 sats increased from 91 to 94% after practicing. Pt would benefit from skilled OT services to increase independence in ADLs, education in energy conservation techniques, pursed lip breathing and recommendations for home modifications to increase safety and prevent falls.  Rec OT HH after discharge.    Follow Up Recommendations  Home health OT    Equipment Recommendations  Tub/shower bench(pt has a shower chair without a back and rec a transfer tub bench to prevent falls)    Recommendations for Other Services       Precautions / Restrictions Precautions Precautions: Fall Restrictions Weight Bearing Restrictions: No      Mobility Bed Mobility Overal bed mobility: Modified Independent                 Transfers Overall transfer level: Needs assistance Equipment used: Rolling walker (2 wheeled) Transfers: Sit to/from Stand Sit to Stand: Min guard;Min assist              Balance Overall balance assessment: Needs assistance Sitting-balance support: No upper extremity supported;Feet supported Sitting balance-Leahy Scale: Good     Standing balance support: Bilateral upper extremity supported Standing balance-Leahy Scale: Fair                             ADL either performed or assessed with clinical judgement   ADL Overall ADL's : Needs assistance/impaired Eating/Feeding: Independent;Set up   Grooming: Wash/dry hands;Wash/dry face;Oral care;Set up;Applying deodorant;Brushing hair;Independent   Upper Body Bathing: Independent;Set up Upper Body Bathing Details (indicate cue type and reason): with rest breaks for SOB Lower Body Bathing: Set up;Maximal assistance;Supervison/ safety Lower Body Bathing Details (indicate cue type and reason): Increase in SOB when leaning forward at EOB for LB dressing and bathing skills Upper Body Dressing : Independent;Set up   Lower Body Dressing: Maximal assistance;Set up Lower Body Dressing Details (indicate cue type and reason): Pt unable to reach feet safely without use of reacher, sock aid and LH shoe horn             Functional mobility during ADLs: Minimal assistance;Rolling walker General ADL Comments: Rest breaks and reminders to use energy conserv tech and purse lip breathing     Vision Baseline Vision/History: Legally blind Additional Comments: legally blind in L eye and poor vision in R  Perception     Praxis      Pertinent Vitals/Pain Pain Assessment: No/denies pain     Hand Dominance Right   Extremity/Trunk Assessment Upper Extremity Assessment Upper Extremity Assessment: Overall WFL for tasks assessed   Lower Extremity Assessment Lower Extremity Assessment: Defer to PT evaluation        Communication Communication Communication: No difficulties   Cognition Arousal/Alertness: Awake/alert Behavior During Therapy: WFL for tasks assessed/performed Overall Cognitive Status: Within Functional Limits for tasks assessed                                     General Comments       Exercises Other Exercises Other Exercises: Educated in role of activity pacing and energy conservation, use of RW for mobility, home modifications; patient/family voiced understanding and agreement.   Shoulder Instructions      Home Living Family/patient expects to be discharged to:: Private residence Living Arrangements: Spouse/significant other Available Help at Discharge: Family Type of Home: House Home Access: Ramped entrance     [place]: One level     Bathroom Shower/Tub: Corporate investment banker: Standard Bathroom Accessibility: No   Home Equipment: Chief Technology Officer: Reacher;Sock aid;Long-handled shoe horn;Long-handled sponge Additional Comments: rec grab bars by toilet and in shower      Prior Functioning/Environment Level of Independence: Independent        Comments: Indep with ADLs, household and limited community mobilization without assist device; does endorse multiple fall history (at least 2-3 in past 6 months).  No home O2.        OT Problem List: Decreased strength;Decreased range of motion;Decreased activity tolerance;Decreased knowledge of use of DME or AE;Cardiopulmonary status limiting activity;Impaired vision/perception      OT Treatment/Interventions: Self-care/ADL training;Patient/family education;Balance training;Energy conservation;DME and/or AE instruction    OT Goals(Current goals can be found in the care plan section) Acute Rehab OT Goals Patient Stated Goal: to go home! OT Goal Formulation: With patient Time For Goal Achievement: 06/24/18 Potential to Achieve Goals: Fair ADL Goals Pt Will Perform  Lower Body Dressing: with set-up;with min assist;with adaptive equipment;sit to/from stand Pt Will Transfer to Toilet: with set-up;with min assist;stand pivot transfer;regular height toilet Pt/caregiver will Perform Home Exercise Program: With written HEP provided;Independently  OT Frequency: Min 2X/week   Barriers to D/C:            Co-evaluation              AM-PAC PT "6 Clicks" Daily Activity     Outcome Measure Help from another person eating meals?: None Help from another person taking care of personal grooming?: None Help from another person toileting, which includes using toliet, bedpan, or urinal?: A Little Help from another person bathing (including washing, rinsing, drying)?: A Lot Help from another person to put on and taking off regular upper body clothing?: None Help from another person to put on and taking off regular lower body clothing?: A Lot 6 Click Score: 19   End of Session    Activity Tolerance: Patient limited by fatigue Patient left: in bed;with call bell/phone within reach;with bed alarm set  OT Visit Diagnosis: Unsteadiness on feet (R26.81);History of falling (Z91.81)                Time: 4970-2637 OT Time Calculation (min): 40 min Charges:  OT General Charges $OT Visit: 1 Visit OT Evaluation $OT  Eval Low Complexity: 1 Low OT Treatments $Self Care/Home Management : 23-37 mins  Chrys Racer, OTR/L ascom [phone] 06/10/18, 4:42 PM

## 2018-06-10 NOTE — Progress Notes (Signed)
Inpatient Diabetes Program Recommendations  AACE/ADA: New Consensus Statement on Inpatient Glycemic Control (2019)  Target Ranges:  Prepandial:   less than 140 mg/dL      Peak postprandial:   less than 180 mg/dL (1-2 hours)      Critically ill patients:  140 - 180 mg/dL   Results for [name], [name] (MRN [id number]) as of 06/10/2018 10:07  Ref. Range 06/09/2018 20:31 06/09/2018 21:56 06/10/2018 08:04  Glucose-Capillary Latest Ref Range: 70 - 99 mg/dL 233 (H) 269 (H) 341 (H)  Results for [name], [name] (MRN [id number]) as of 06/10/2018 10:07  Ref. Range 06/09/2018 23:39  Hemoglobin A1C Latest Ref Range: 4.8 - 5.6 % 6.4 (H)   Review of Glycemic Control  Diabetes history: DM2 Outpatient Diabetes medications: Lantus 30 units QPM, Metformin 500 mg BID, Glipizide 5 mg BID Current orders for Inpatient glycemic control: Novolog 0-9 units TID with meals, Novolog 0-5 units QHS  Inpatient Diabetes Program Recommendations: Insulin - Basal: Please consider ordering Lantus 15 units daily (starting now).  Thanks, Barnie Alderman, RN, MSN, CDE Diabetes Coordinator Inpatient Diabetes Program [phone] (Team Pager from 8am to 5pm)

## 2018-06-10 NOTE — Consult Note (Signed)
Hematology/Oncology Consult note [facility] [phone] [phone]  Patient Care Team: Lucille Passy, MD as PCP - General Himmelrich, Bryson Ha, RD (Inactive) as Dietitian Tia Masker) Mellody Drown, MD as Referring Physician (Obstetrics and Gynecology)   Name of the patient: [name]  [id number]  1951-03-26   Date of visit: 06/10/18 REASON FOR COSULTATION:  Metastatic ovarian cancer, ? New liver lesion on abdominal CT, ?lymphangitic spreak on CT chest History of presenting illness-  67 y.o. female with PMH multiple comorbidities including no history of diagnostic CHF, metastatic ovarian cancer on chemotherapy, diabetes, hyperlipidemia, obesity and other medical problems listed at below who was sent to ER due to respiratory distress.  She was only saturating 50% in the field.  Was placed on CPAP and she was saturating in the mid 80s.  Brought to emergency room.  Chest x-ray showed patchy airspace opacities and pulmonary edema, difficulty excluding superimposed pneumonia..  Hemoglobin was at 4.3.  No reports of active bleeding. Status post 2 units of PRBC transfusion Currently admitted to ICU for acute respiratory failure, on broad-spectrum antibiotics for possible HCAP, also elevated BNP, acute on chronic CHF/ pulmonary edema.  CT chest abdomen pelvis done on 06/09/2018, compared to previous images in 2016, showed new hypodense lesions of the liver, some which are noted in the right hepatic lobe ranging in size from 40mm to 26 mm with adjacent lobular soft tissue masslike abnormality abutting the gallbladder.  Peritoneal carcinomatosis and tumor implants in the omentum.  Mildly enlarged right upper quadrant lymph node appears stable Omental nodules persist in the left lower quadrant are slightly better defined on current exam.  Measuring up to 16 mm Right side double pigtail catheter urethral stent is in place.  No evidence of bowel obstruction.    In the chest, diffuse groundglass opacities are noted throughout the lungs with a small 4 mm nodular density superimposed.  Possible noncardiogenic pulmonary edema ?  Transfusion related versus other differentials such as alveolar hemorrhage, interstitial pneumonia, ARDS, etc. patient's cancer care was at [facility] with Dr. Fransisca Connors.  Patient's family had requested patient to be transferred to [facility].  Patient was accepted by [facility] and waiting for bed availability. Patient was seen at bedside.  Multiple family members at bedside.  Patient reports feeling better since admission.  Breathing comfortably on nasal cannula oxygen, maintaining saturation 90%.  Able to talk in sentences.  Review of Systems  Constitutional: Positive for malaise/fatigue.  HENT: Negative for nosebleeds and sore throat.   Eyes: Negative for double vision, photophobia and redness.  Respiratory: Positive for shortness of breath. Negative for cough.   Cardiovascular: Negative for chest pain, palpitations and orthopnea.  Gastrointestinal: Negative for blood in stool, nausea and vomiting.  Genitourinary: Negative for dysuria.  Musculoskeletal: Negative for myalgias.  Skin: Negative for itching and rash.  Neurological: Negative for dizziness and tingling.  Endo/Heme/Allergies: Does not bruise/bleed easily.  Psychiatric/Behavioral: Negative for depression.    Allergies  Allergen Reactions  . Aspirin Other (See Comments)    asthma  . Lorazepam Shortness Of Breath  . Codeine Nausea And Vomiting    Patient Active Problem List   Diagnosis Date Noted  . Acute respiratory failure ([place]) 06/09/2018  . Cellulitis of left ear 09/22/2017  . Atrial flutter ([place]) 04/17/2017  . Edema 09/02/2016  . Need for home health care 07/17/2015  . Hyperglycemia 04/13/2015  . Hyperkalemia 04/13/2015  . Ovarian cancer ([place]) 04/07/2015  . Iron deficiency anemia 04/07/2015  .  Pelvic mass in female   . Peritoneal carcinomatosis ([place])   .  Dizziness and giddiness 08/09/2014  . OA (osteoarthritis) of finger 03/24/2014  . OBESITY, UNSPECIFIED 12/29/2009  . POSTMENOPAUSAL STATUS 08/09/2009  . RETINAL DETACHMENT 05/10/2009  . DIASTOLIC HEART FAILURE, CHRONIC 02/28/2009  . DYSPNEA ON EXERTION 02/28/2009  . RECENT RETINAL DETACHMENT PARTIAL W/GIANT TEAR 07/24/2008  . ANEMIA 12/08/2007  . ASTHMA, WITH ACUTE EXACERBATION 12/08/2007  . HYPERTENSION, BENIGN 11/03/2007  . Diabetes ([place]) 05/05/2007  . HLD (hyperlipidemia) 05/05/2007  . Anxiety state 05/05/2007  . DEPRESSION 05/05/2007  . Congestive heart failure ([place]) 01/20/2007  . LUNG NODULE 01/20/2007     Past Medical History:  Diagnosis Date  . Acute upper respiratory infections of unspecified site   . Anemia, unspecified   . Anxiety state, unspecified   . Cellulitis   . Chronic diastolic heart failure ([place])   . Congestive heart failure, unspecified   . Contact dermatitis and other eczema, due to unspecified cause   . Depressive disorder, not elsewhere classified   . Diabetes mellitus   . Essential hypertension, benign   . Mastoiditis   . Other and unspecified hyperlipidemia   . Other diseases of lung, not elsewhere classified   . Other dyspnea and respiratory abnormality   . Recent retinal detachment, partial, with giant tear   . Type II or unspecified type diabetes mellitus with peripheral circulatory disorders, not stated as uncontrolled(250.70)   . Unspecified asthma, with exacerbation   . Unspecified urinary incontinence   . Wheezing      Past Surgical History:  Procedure Laterality Date  . ABDOMINAL HYSTERECTOMY    . DILATION AND CURETTAGE OF UTERUS    . EYE SURGERY     Left retina repair- 11/09, 02-20-09  . KNEE SURGERY  2003   meniscus  . LAPAROSCOPIC GASTRIC BANDING  08/21/10    Social History   Socioeconomic History  . Marital status: Married    Spouse name: Not on file  . Number of children: Not on file  . Years of education: Not on file  .  Highest education level: Not on file  Occupational History  . Occupation: School bus Education administrator: RETIRED  Social Needs  . Financial resource strain: Not on file  . Food insecurity:    Worry: Not on file    Inability: Not on file  . Transportation needs:    Medical: Not on file    Non-medical: Not on file  Tobacco Use  . Smoking status: Never Smoker  . Smokeless tobacco: Never Used  Substance and Sexual Activity  . Alcohol use: No    Alcohol/week: 0.0 standard drinks  . Drug use: No  . Sexual activity: Never  Lifestyle  . Physical activity:    Days per week: Not on file    Minutes per session: Not on file  . Stress: Not on file  Relationships  . Social connections:    Talks on phone: Not on file    Gets together: Not on file    Attends religious service: Not on file    Active member of club or organization: Not on file    Attends meetings of clubs or organizations: Not on file    Relationship status: Not on file  . Intimate partner violence:    Fear of current or ex partner: Not on file    Emotionally abused: Not on file    Physically abused: Not on file    Forced  sexual activity: Not on file  Other Topics Concern  . Not on file  Social History Narrative  . Not on file     Family History  Problem Relation Age of Onset  . Cancer Mother        breast  . Cancer Sister        breast  . Cancer Maternal Aunt        breast  . Cancer Maternal Uncle        breast  . Cancer Maternal Grandmother        breast  . Cancer Maternal Uncle      Current Facility-Administered Medications:  .  acetaminophen (TYLENOL) tablet 650 mg, 650 mg, Oral, Q6H PRN **OR** acetaminophen (TYLENOL) suppository 650 mg, 650 mg, Rectal, Q6H PRN, Gouru, Aruna, MD .  amiodarone (PACERONE) tablet 200 mg, 200 mg, Oral, Daily, Manuella Ghazi, Rutul, MD, 200 mg at 06/10/18 1134 .  atorvastatin (LIPITOR) tablet 40 mg, 40 mg, Oral, Daily, Darel Hong D, NP .  budesonide (PULMICORT) nebulizer  solution 0.25 mg, 0.25 mg, Nebulization, BID, Darel Hong D, NP, 0.25 mg at 06/10/18 0753 .  busPIRone (BUSPAR) tablet 10 mg, 10 mg, Oral, QHS, Darel Hong D, NP, 10 mg at 06/09/18 2223 .  ceFEPIme (MAXIPIME) 2 g in sodium chloride 0.9 % 100 mL IVPB, 2 g, Intravenous, Q12H, Hallaji, Sheema M, RPH, Last Rate: 200 mL/hr at 06/10/18 0502, 2 g at 06/10/18 0502 .  diltiazem (CARDIZEM CD) 24 hr capsule 240 mg, 240 mg, Oral, Daily, Lahoma Rocker, MD, 240 mg at 06/10/18 1135 .  famotidine (PEPCID) IVPB 20 mg premix, 20 mg, Intravenous, Q12H, Gouru, Aruna, MD, Last Rate: 100 mL/hr at 06/10/18 1138, 20 mg at 06/10/18 1138 .  FLUoxetine (PROZAC) capsule 60 mg, 60 mg, Oral, Daily, Darel Hong D, NP .  furosemide (LASIX) injection 60 mg, 60 mg, Intravenous, Q12H, Lahoma Rocker, MD, 60 mg at 06/10/18 1133 .  insulin aspart (novoLOG) injection 0-5 Units, 0-5 Units, Subcutaneous, QHS, Gouru, Aruna, MD, 3 Units at 06/09/18 2220 .  insulin aspart (novoLOG) injection 0-9 Units, 0-9 Units, Subcutaneous, TID WC, Gouru, Aruna, MD, 9 Units at 06/10/18 1220 .  insulin glargine (LANTUS) injection 30 Units, 30 Units, Subcutaneous, Daily, Lahoma Rocker, MD, 30 Units at 06/10/18 1139 .  ipratropium (ATROVENT) nebulizer solution 0.5 mg, 0.5 mg, Nebulization, Q6H, Manuella Ghazi, Rutul, MD, 0.5 mg at 06/10/18 1343 .  ondansetron (ZOFRAN) tablet 4 mg, 4 mg, Oral, Q6H PRN **OR** ondansetron (ZOFRAN) injection 4 mg, 4 mg, Intravenous, Q6H PRN, Gouru, Aruna, MD .  tamsulosin (FLOMAX) capsule 0.4 mg, 0.4 mg, Oral, Daily, Darel Hong D, NP, 0.4 mg at 06/10/18 1135 .  valACYclovir (VALTREX) tablet 1,000 mg, 1,000 mg, Oral, Daily, Lahoma Rocker, MD, 1,000 mg at 06/10/18 1134 .  vancomycin (VANCOCIN) 1,250 mg in sodium chloride 0.9 % 250 mL IVPB, 1,250 mg, Intravenous, Q24H, Hallaji, Sheema M, RPH, Last Rate: 166.7 mL/hr at 06/10/18 0553, 1,250 mg at 06/10/18 0553   Physical exam:  Vitals:   06/10/18 0900 06/10/18 1000 06/10/18 1100  06/10/18 1455  BP: (!) 145/66 (!) 141/93 (!) 146/74   Pulse: 85 82 81   Resp: 19 (!) 21 15   Temp: 98.1 F (36.7 C)     TempSrc: Oral     SpO2:    90%  Weight:      Height:       Physical Exam  Constitutional: She is oriented to person, place, and time. No distress.  HENT:  Head: Normocephalic and atraumatic.  Mouth/Throat: No oropharyngeal exudate.  Eyes: Pupils are equal, round, and reactive to light. EOM are normal.  Neck: Neck supple.  Cardiovascular: Normal rate.  No murmur heard. Pulmonary/Chest: Effort normal. She has no wheezes.  non-labored breath  Abdominal: Soft. Bowel sounds are normal.  Musculoskeletal: Normal range of motion.  Neurological: She is alert and oriented to person, place, and time.  Skin: Skin is warm and dry.  Psychiatric: Affect normal.        CMP Latest Ref Rng & Units 06/10/2018  Glucose 70 - 99 mg/dL 382(H)  BUN 8 - 23 mg/dL 26(H)  Creatinine 0.44 - 1.00 mg/dL 1.68(H)  Sodium 135 - 145 mmol/L 135  Potassium 3.5 - 5.1 mmol/L 5.0  Chloride 98 - 111 mmol/L 102  CO2 22 - 32 mmol/L 24  Calcium 8.9 - 10.3 mg/dL 7.4(L)  Total Protein 6.5 - 8.1 g/dL 5.9(L)  Total Bilirubin 0.3 - 1.2 mg/dL 0.6  Alkaline Phos 38 - 126 U/L 85  AST 15 - 41 U/L 21  ALT 0 - 44 U/L 22   CBC Latest Ref Rng & Units 06/10/2018  WBC 3.6 - 11.0 K/uL 5.9  Hemoglobin 12.0 - 16.0 g/dL 9.1(L)  Hematocrit 35.0 - 47.0 % 26.5(L)  Platelets 150 - 440 K/uL 243   RADIOGRAPHIC STUDIES: I have personally reviewed the radiological images as listed and agreed with the findings in the report. Ct Abdomen Pelvis Wo Contrast  Result Date: 06/09/2018 CLINICAL DATA:  67 year old female with history of diastolic congestive heart failure and metastatic ovarian cancer presents with dyspnea and low hemoglobin requiring blood transfusions. EXAM: CT CHEST, ABDOMEN AND PELVIS WITHOUT CONTRAST TECHNIQUE: Multidetector CT imaging of the chest, abdomen and pelvis was performed following the  standard protocol without IV contrast. COMPARISON:  Same day CXR, chest CT 04/05/2015, CT abdomen pelvis 03/31/2015 FINDINGS: Cardiovascular: Borderline cardiomegaly. No significant pericardial effusion or thickening. Mitral annular calcifications are identified. Minimal aortic atherosclerosis without aneurysm. Scattered three-vessel coronary arteriosclerosis. Mediastinum/Nodes: Right anterior chest wall port catheter with tip the distal SVC. Mildly enlarged left lobe of the thyroid gland, heterogeneous and partially calcified in appearance. Coarse calcification in the right lobe of the thyroid gland. Findings may reflect a goiter. Small mediastinal lymph nodes. Assessment of the hila is limited by lack of IV contrast. Lungs/Pleura: Small left, greater than right pleural effusions. Extensive diffuse bilateral ground-glass opacities are identified. Superimposed small 4 mm nodular opacities are identified in the lingula left upper lobe in particular. Given history of blood transfusions, findings may represent stigmata of transfusion associated lung injury leading to noncardiogenic pulmonary edema though differential possibilities may also include diffuse alveolar hemorrhage or interstitial pneumonia. Favor noncardiogenic pulmonary edema. Musculoskeletal: Acute nor aggressive osseous lesions. CT ABDOMEN PELVIS FINDINGS Hepatobiliary: New in homogeneous areas of hypodensity involving the liver are identified, in the right lobe measuring between 19 and 26 mm in diameter. Adjacent to the gallbladder is in in homogeneous hypodense masslike abnormality suspicious for large peritoneal metastatic focus, series 2/63. Exact margins are difficult to separate from the adjacent gallbladder. No biliary dilatation is identified. There is mild biliary sludge. Pancreas: Atrophic pancreas. Spleen: Normal seen. Adrenals/Urinary Tract: No adrenal mass. Tail catheter noted in the right ureter with proximal coil in the pelvis of the right  kidney and distal coil in the bladder. No apparent calculus identified along the course of the right ureter. The left kidney demonstrates no obstructive uropathy or hydroureteronephrosis. Stomach/Bowel: Gastric band device  is noted about the stomach near the gastroesophageal junction. The orientation of the band is within normal limits spanning the 2 o'clock to 8 o'clock position on the coronal images. Small omental fat and small bowel containing infraumbilical ventral hernia is identified without obstruction bowel obstruction. Vascular/Lymphatic: Minimal aortic atherosclerosis. Scattered mesenteric lymph nodes are identified, the largest in the right upper quadrant measuring 15 mm short axis and stable appearance. Reproductive: Hysterectomy.  No adnexal mass. Other: Small amount of ascites in the lower pelvis. Previously identified omental caking as well as peritoneal implants are less prominent. Omental nodules persist in the left lower quadrant, series 2/91 through 94, largest measuring up to 16 mm. Musculoskeletal: No acute nor suspicious osseous abnormalities. Degenerative disc disease L2-3 and L5-S1. mild soft tissue induration along the ventral aspect of pelvis with underlying scarring. IMPRESSION: Chest CT: Diffuse ground-glass opacities are noted throughout the lungs with small 4 mm nodular density superimposed. These nodular densities are better visualized and noted in the left upper lobe and lingula. Given history of multiple transfusions a leading consideration is noncardiogenic pulmonary edema likely transfusion related within other differential possibilities including alveolar hemorrhage, interstitial pneumonia, ARDS. Non-contrast chest CT can be considered in 12 months pain high-risk patients. This recommendation follows the consensus statement: Guidelines for Management of Incidental Pulmonary Nodules Detected on CT Images: From the Fleischner Society 2017; Radiology 2017; 284:228-243. CT AP: 1. New  since prior exam are hypodense lesions of the liver, some which are noted in the right hepatic lobe ranging in size from 19 mm 26 mm with adjacent lobular soft tissue masslike abnormality abutting the gallbladder. Metastatic disease may account for this appearance as well as stigmata of peritoneal carcinomatosis and tumor implants in the omentum. Mildly enlarged right upper quadrant lymph node appears stable 15 mm series 2/69. Omental nodules persist in the left lower quadrant in are slightly better defined on current exam the prior the largest measuring up to 16 mm. 2. Interval decrease in upper abdominal ascites with small amount of ascites in pelvis. 3. Right-sided double pigtail catheter ureteral stent is in place without complicating features. 4. No evidence of bowel obstruction. 5. Postop change along the ventral abdomen and pelvis. 6. Shallow omental fat and small bowel containing infraumbilical hernia without bowel obstruction. Electronically Signed   By: Ashley Royalty M.D.   On: 06/09/2018 20:59   Dg Chest 1 View  Result Date: 06/09/2018 CLINICAL DATA:  Dyspnea and wheezing EXAM: CHEST  1 VIEW COMPARISON:  04/17/2017 FINDINGS: Heart size is top-normal. Nonaneurysmal thoracic aorta is noted. Right IJ port catheter is noted with tip in the distal SVC-RA juncture. New bilateral in homogeneous airspace opacities are seen scattered throughout both lungs likely representing a component of mild pulmonary edema. Superimposed pneumonia would be difficult to entirely exclude especially at the lung bases. No significant pleural effusion identified. No pneumothorax. No acute osseous abnormality. IMPRESSION: Stable borderline cardiomegaly with patchy airspace opacities suspicious for mild pulmonary edema. Superimposed pneumonia would be difficult to entirely exclude. These results were discussed at the time of interpretation on 06/09/2018 at 6:16 pm with Dr. Larae Grooms Electronically Signed   By: Ashley Royalty M.D.    On: 06/09/2018 18:16   Ct Chest Wo Contrast  Result Date: 06/09/2018 CLINICAL DATA:  67 year old female with history of diastolic congestive heart failure and metastatic ovarian cancer presents with dyspnea and low hemoglobin requiring blood transfusions. EXAM: CT CHEST, ABDOMEN AND PELVIS WITHOUT CONTRAST TECHNIQUE: Multidetector CT imaging of the chest,  abdomen and pelvis was performed following the standard protocol without IV contrast. COMPARISON:  Same day CXR, chest CT 04/05/2015, CT abdomen pelvis 03/31/2015 FINDINGS: Cardiovascular: Borderline cardiomegaly. No significant pericardial effusion or thickening. Mitral annular calcifications are identified. Minimal aortic atherosclerosis without aneurysm. Scattered three-vessel coronary arteriosclerosis. Mediastinum/Nodes: Right anterior chest wall port catheter with tip the distal SVC. Mildly enlarged left lobe of the thyroid gland, heterogeneous and partially calcified in appearance. Coarse calcification in the right lobe of the thyroid gland. Findings may reflect a goiter. Small mediastinal lymph nodes. Assessment of the hila is limited by lack of IV contrast. Lungs/Pleura: Small left, greater than right pleural effusions. Extensive diffuse bilateral ground-glass opacities are identified. Superimposed small 4 mm nodular opacities are identified in the lingula left upper lobe in particular. Given history of blood transfusions, findings may represent stigmata of transfusion associated lung injury leading to noncardiogenic pulmonary edema though differential possibilities may also include diffuse alveolar hemorrhage or interstitial pneumonia. Favor noncardiogenic pulmonary edema. Musculoskeletal: Acute nor aggressive osseous lesions. CT ABDOMEN PELVIS FINDINGS Hepatobiliary: New in homogeneous areas of hypodensity involving the liver are identified, in the right lobe measuring between 19 and 26 mm in diameter. Adjacent to the gallbladder is in in homogeneous  hypodense masslike abnormality suspicious for large peritoneal metastatic focus, series 2/63. Exact margins are difficult to separate from the adjacent gallbladder. No biliary dilatation is identified. There is mild biliary sludge. Pancreas: Atrophic pancreas. Spleen: Normal seen. Adrenals/Urinary Tract: No adrenal mass. Tail catheter noted in the right ureter with proximal coil in the pelvis of the right kidney and distal coil in the bladder. No apparent calculus identified along the course of the right ureter. The left kidney demonstrates no obstructive uropathy or hydroureteronephrosis. Stomach/Bowel: Gastric band device is noted about the stomach near the gastroesophageal junction. The orientation of the band is within normal limits spanning the 2 o'clock to 8 o'clock position on the coronal images. Small omental fat and small bowel containing infraumbilical ventral hernia is identified without obstruction bowel obstruction. Vascular/Lymphatic: Minimal aortic atherosclerosis. Scattered mesenteric lymph nodes are identified, the largest in the right upper quadrant measuring 15 mm short axis and stable appearance. Reproductive: Hysterectomy.  No adnexal mass. Other: Small amount of ascites in the lower pelvis. Previously identified omental caking as well as peritoneal implants are less prominent. Omental nodules persist in the left lower quadrant, series 2/91 through 94, largest measuring up to 16 mm. Musculoskeletal: No acute nor suspicious osseous abnormalities. Degenerative disc disease L2-3 and L5-S1. mild soft tissue induration along the ventral aspect of pelvis with underlying scarring. IMPRESSION: Chest CT: Diffuse ground-glass opacities are noted throughout the lungs with small 4 mm nodular density superimposed. These nodular densities are better visualized and noted in the left upper lobe and lingula. Given history of multiple transfusions a leading consideration is noncardiogenic pulmonary edema likely  transfusion related within other differential possibilities including alveolar hemorrhage, interstitial pneumonia, ARDS. Non-contrast chest CT can be considered in 12 months pain high-risk patients. This recommendation follows the consensus statement: Guidelines for Management of Incidental Pulmonary Nodules Detected on CT Images: From the Fleischner Society 2017; Radiology 2017; 284:228-243. CT AP: 1. New since prior exam are hypodense lesions of the liver, some which are noted in the right hepatic lobe ranging in size from 19 mm 26 mm with adjacent lobular soft tissue masslike abnormality abutting the gallbladder. Metastatic disease may account for this appearance as well as stigmata of peritoneal carcinomatosis and tumor implants in the omentum.  Mildly enlarged right upper quadrant lymph node appears stable 15 mm series 2/69. Omental nodules persist in the left lower quadrant in are slightly better defined on current exam the prior the largest measuring up to 16 mm. 2. Interval decrease in upper abdominal ascites with small amount of ascites in pelvis. 3. Right-sided double pigtail catheter ureteral stent is in place without complicating features. 4. No evidence of bowel obstruction. 5. Postop change along the ventral abdomen and pelvis. 6. Shallow omental fat and small bowel containing infraumbilical hernia without bowel obstruction. Electronically Signed   By: Ashley Royalty M.D.   On: 06/09/2018 20:59    Assessment and plan- Patient is a 67 y.o. female with history of metastatic ovarian cancer, currently admitted due to acute respiratory failure.  #Metastatic ovarian cancer. CT image was independently reviewed by me and discussed with patient and family members.  Inpatient CT scan reported new liver lesion which was based on the comparison to older CT images done locally back in 2016. Also reviewed radiology reports of CT chest abdomen pelvis with contrast that was done on 05/05/2018 at [facility].[Images not  available to me] Patient has had liver lesions 1.2 x 1.6cm right hepatic lobe on 05/05/2018 , additional hepatic lesions were not well visualized due to scan quality.   No consolidative opacities in the chest.. Patient's tumor marker has been rising since May 2019.  Last CA125 at 1055.6 done on 04/17/2018 at [facility]. Clinically disease is progressing, given the increase of tumor marker and likely increase the sizes of hepatic lesions. We discussed about CODE STATUS and patient wants to stay on full code. Patient declined palliative care consult and want to focus on getting better.  I think this is reasonable and to further goal of care discussion can be carried between patient/family and her primary GYN oncologist at [facility]   #Diffuse groundglass opacities in the lung, most likely secondary to pulmonary edema/volume overload, cannot exclude lymphangitic spread. She has been on broad-spectrum antibiotics for possible superimposed pneumonia.  Procalcitonin 0.73 I had a lengthy discussion with patient and her family members.  I agree with empiric treatments with broad-spectrum antibiotics, supportive care for CHF and respiratory failure.  She is waiting for bed availability to be transferred to [facility]. I talked to Dr. Blake Divine fellow Dr.Allison Puechl [ Dr.Berchuck in a meeting], and discussed about the case.  Dr.Puechl recommends continue supportive care and was patient arrives [facility], further goal of care discussion can be discussed there.   Thank you for allowing me to participate in the care of this patient.  Total face to face encounter time for this patient visit was 70 min. >50% of the time was  spent in counseling and coordination of care.    Earlie Server, MD, PhD Hematology Oncology [facility] Pager- [phone] 06/10/2018

## 2018-06-10 NOTE — Progress Notes (Signed)
*  PRELIMINARY RESULTS* Echocardiogram 2D Echocardiogram has been performed.  [name] 06/10/2018, 10:49 AM

## 2018-06-10 NOTE — Progress Notes (Signed)
Pt's family had requested in ED that pt transfer to [facility].  ED physician called and spoke with [facility], and bed was not available.  Called and verified/confirmed with [facility] that pt is on their waiting list, and the they will call back when bed becomes available.

## 2018-06-10 NOTE — Plan of Care (Signed)
Patient was transferred to room 244 at 2230. No complaints of pain. Patient is ambulatory. Patient complained of shortness of breath with exertion. Initiated heart failure education.

## 2018-06-10 NOTE — Evaluation (Signed)
Physical Therapy Evaluation Patient Details Name: [name] MRN: [id number] DOB: 25-Aug-1951 Today's Date: 06/10/2018   History of Present Illness  presented to ER secondary to respiratory distress; admitted secondary to acute/chronic respiratory failure secondary to HCAP, pulmonary edema and symptomatic anemia (initial HgB 4.3, current 9.1).  Initially requiring BiPAP, now weaned to 2L supplemental O2 via [place].  Clinical Impression  Upon evaluation, patient alert and oriented; follows all commands.  Eager for OOB activity as tolerated.  Strength and ROM grossly symmetrical and WFL; no focal weakness, sensory deficit appreciated.  Able to complete bed mobility with sup; sit/stand, basic transfers and gait (35' x2) with RW, cga/min assist. Mod reliance on RW, but no overt buckling or LOB. Consistent cuing for pursed lip breathing, activity pacing and energy conservation.  Do recommend continued use of RW for all mobility; patient voiced understanding and awareness. Of note, patient not on home O2 at baseline.  Resting sats on RA 93-94%; mild desat to 89-90% with exertion, but quickly recovers >92% with seated rest (20-30 seconds).  Left on RA end of session.  RN informed/aware.  Would benefit from skilled PT to address above deficits and promote optimal return to PLOF; Recommend transition to [place] upon discharge from acute hospitalization.     Follow Up Recommendations Home health PT    Equipment Recommendations  (has RW in home)    Recommendations for Other Services       Precautions / Restrictions Precautions Precautions: Fall Restrictions Weight Bearing Restrictions: No      Mobility  Bed Mobility Overal bed mobility: Modified Independent                Transfers Overall transfer level: Needs assistance Equipment used: Rolling walker (2 wheeled) Transfers: Sit to/from Stand Sit to Stand: Min guard;Min assist            Ambulation/Gait Ambulation/Gait  assistance: Min guard;Min assist Gait Distance (Feet): 35 Feet(x2) Assistive device: Rolling walker (2 wheeled)       General Gait Details: reciprocal stepping pattern with fair step height/length; slow and guarded, but no overt buckling or LOB.  Min cuing for pursed lip breathing.  Sats 89-90% on RA with exertion, recovering to >93% on RA with seated rest.  Stairs            Wheelchair Mobility    Modified Rankin (Stroke Patients Only)       Balance Overall balance assessment: Needs assistance Sitting-balance support: No upper extremity supported;Feet supported Sitting balance-Leahy Scale: Good     Standing balance support: Bilateral upper extremity supported Standing balance-Leahy Scale: Fair                               Pertinent Vitals/Pain Pain Assessment: No/denies pain    Home Living Family/patient expects to be discharged to:: Private residence Living Arrangements: Spouse/significant other Available Help at Discharge: Family Type of Home: House Home Access: Ramped entrance     Home Layout: One level Home Equipment: Environmental consultant - 2 wheels      Prior Function Level of Independence: Independent         Comments: Indep with ADLs, household and limited community mobilization without assist device; does endorse multiple fall history (at least 2-3 in past 6 months).  No home O2.     Hand Dominance        Extremity/Trunk Assessment   Upper Extremity Assessment Upper Extremity Assessment: Overall WFL for  tasks assessed    Lower Extremity Assessment Lower Extremity Assessment: Overall WFL for tasks assessed(grossly 4-/5 throughout)       Communication   Communication: No difficulties  Cognition Arousal/Alertness: Awake/alert Behavior During Therapy: WFL for tasks assessed/performed Overall Cognitive Status: Within Functional Limits for tasks assessed                                        General Comments       Exercises Other Exercises Other Exercises: Educated in role of activity pacing and energy conservation, use of RW for mobility, home modifications; patient/family voiced understanding and agreement.   Assessment/Plan    PT Assessment Patient needs continued PT services  PT Problem List Decreased strength;Decreased activity tolerance;Decreased balance;Decreased mobility;Decreased coordination;Decreased cognition;Decreased knowledge of use of DME;Decreased knowledge of precautions;Decreased safety awareness;Cardiopulmonary status limiting activity;Obesity       PT Treatment Interventions DME instruction;Gait training;Functional mobility training;Therapeutic activities;Therapeutic exercise;Balance training;Patient/family education    PT Goals (Current goals can be found in the Care Plan section)  Acute Rehab PT Goals Patient Stated Goal: to go home! PT Goal Formulation: With patient/family Time For Goal Achievement: 06/24/18 Potential to Achieve Goals: Good    Frequency Min 2X/week   Barriers to discharge        Co-evaluation               AM-PAC PT "6 Clicks" Daily Activity  Outcome Measure Difficulty turning over in bed (including adjusting bedclothes, sheets and blankets)?: A Little Difficulty moving from lying on back to sitting on the side of the bed? : A Little Difficulty sitting down on and standing up from a chair with arms (e.g., wheelchair, bedside commode, etc,.)?: Unable Help needed moving to and from a bed to chair (including a wheelchair)?: A Little Help needed walking in hospital room?: A Little Help needed climbing 3-5 steps with a railing? : A Little 6 Click Score: 16    End of Session Equipment Utilized During Treatment: Gait belt;Oxygen Activity Tolerance: Patient tolerated treatment well Patient left: in bed;with call bell/phone within reach;with family/visitor present Nurse Communication: Mobility status PT Visit Diagnosis: Unsteadiness on feet  (R26.81);Muscle weakness (generalized) (M62.81);Difficulty in walking, not elsewhere classified (R26.2)    Time: 1103-1594 PT Time Calculation (min) (ACUTE ONLY): 31 min   Charges:   PT Evaluation $PT Eval Moderate Complexity: 1 Mod PT Treatments $Gait Training: 8-22 mins       Gotti Alwin H. Owens Shark, PT, DPT, NCS 06/10/18, 3:13 PM [phone]

## 2018-06-10 NOTE — Consult Note (Signed)
Pharmacy Antibiotic Note  [name] is a 67 y.o. female admitted on 06/09/2018 with acute respiratory failure and sepsis.  Pharmacy has been consulted for Vancomycin and Cefepime dosing.  Plan: Per CCM on ICU 9/25 rounds, will continue vancomycin and cefepime today and reassess patient tomorrow for de-escalation or discontinuation  Continue vancomycin 1250 mg IV every 24 hours. Goal trough 15-20 mcg/mL. Trough level ordered 9/27 at 0530. Continue cefepime 2 g IV every 12 hours   Height: 5\' 5"  (165.1 cm) Weight: 244 lb 4.3 oz (110.8 kg) IBW/kg (Calculated) : 57  Temp (24hrs), Avg:98 F (36.7 C), Min:97.8 F (36.6 C), Max:98.5 F (36.9 C)  Recent Labs  Lab 06/09/18 1725 06/09/18 1949 06/09/18 2339 06/10/18 0644  WBC 8.9  --   --  5.9  CREATININE 1.72*  --   --  1.68*  LATICACIDVEN  --  1.7 1.6  --     Estimated Creatinine Clearance: 39.2 mL/min (A) (by C-G formula based on SCr of 1.68 mg/dL (H)).    Allergies  Allergen Reactions  . Aspirin Other (See Comments)    asthma  . Lorazepam Shortness Of Breath  . Codeine Nausea And Vomiting    Antimicrobials this admission:  9/24 Cefepime >>  9/24 vancomycin >>  Microbiology results: 9/24 BCx: NG 12 hours MRSA PCR: negative  Thank you for allowing pharmacy to be a part of this patient's care.  Tawnya Crook, PharmD Pharmacy Resident  06/10/2018 3:14 PM

## 2018-06-10 NOTE — Progress Notes (Signed)
[facility] [place] NAME: [name]    MR#:  [id number]  DATE OF BIRTH:  11/15/1950  SUBJECTIVE:  CHIEF COMPLAINT:   Chief Complaint  Patient presents with  . Respiratory Distress   Came with respi distress. Had low Hb, after transfusion Hb is stable. No bleed.  REVIEW OF SYSTEMS:  CONSTITUTIONAL: No fever, fatigue or weakness.  EYES: No blurred or double vision.  EARS, NOSE, AND THROAT: No tinnitus or ear pain.  RESPIRATORY: No cough, shortness of breath, wheezing or hemoptysis.  CARDIOVASCULAR: No chest pain, orthopnea, edema.  GASTROINTESTINAL: No nausea, vomiting, diarrhea or abdominal pain.  GENITOURINARY: No dysuria, hematuria.  ENDOCRINE: No polyuria, nocturia,  HEMATOLOGY: No anemia, easy bruising or bleeding SKIN: No rash or lesion. MUSCULOSKELETAL: No joint pain or arthritis.   NEUROLOGIC: No tingling, numbness, weakness.  PSYCHIATRY: No anxiety or depression.   ROS  DRUG ALLERGIES:   Allergies  Allergen Reactions  . Aspirin Other (See Comments)    asthma  . Lorazepam Shortness Of Breath  . Codeine Nausea And Vomiting    VITALS:  Blood pressure (!) 119/52, pulse 80, temperature 97.9 F (36.6 C), temperature source Oral, resp. rate 18, height 5\' 5"  (1.651 m), weight 113.2 kg, SpO2 97 %.  PHYSICAL EXAMINATION:  GENERAL:  67 y.o.-year-old patient lying in the bed with no acute distress.  EYES: Pupils equal, round, reactive to light and accommodation. No scleral icterus. Extraocular muscles intact.  HEENT: Head atraumatic, normocephalic. Oropharynx and nasopharynx clear.  NECK:  Supple, no jugular venous distention. No thyroid enlargement, no tenderness.  LUNGS: Normal breath sounds bilaterally, no wheezing, rales,rhonchi or crepitation. No use of accessory muscles of respiration.  CARDIOVASCULAR: S1, S2 normal. No murmurs, rubs, or gallops.  ABDOMEN: Soft, nontender, nondistended. Bowel sounds present. No  organomegaly or mass.  EXTREMITIES: No pedal edema, cyanosis, or clubbing.  NEUROLOGIC: Cranial nerves II through XII are intact. Muscle strength 5/5 in all extremities. Sensation intact. Gait not checked.  PSYCHIATRIC: The patient is alert and oriented x 3.  SKIN: No obvious rash, lesion, or ulcer.   Physical Exam LABORATORY PANEL:   CBC Recent Labs  Lab 06/10/18 0644  WBC 5.9  HGB 9.1*  HCT 26.5*  PLT 243   ------------------------------------------------------------------------------------------------------------------  Chemistries  Recent Labs  Lab 06/10/18 0644  NA 135  K 5.0  CL 102  CO2 24  GLUCOSE 382*  BUN 26*  CREATININE 1.68*  CALCIUM 7.4*  AST 21  ALT 22  ALKPHOS 85  BILITOT 0.6   ------------------------------------------------------------------------------------------------------------------  Cardiac Enzymes Recent Labs  Lab 06/09/18 1725  TROPONINI <0.03   ------------------------------------------------------------------------------------------------------------------  RADIOLOGY:  Ct Abdomen Pelvis Wo Contrast  Result Date: 06/09/2018 CLINICAL DATA:  67 year old female with history of diastolic congestive heart failure and metastatic ovarian cancer presents with dyspnea and low hemoglobin requiring blood transfusions. EXAM: CT CHEST, ABDOMEN AND PELVIS WITHOUT CONTRAST TECHNIQUE: Multidetector CT imaging of the chest, abdomen and pelvis was performed following the standard protocol without IV contrast. COMPARISON:  Same day CXR, chest CT 04/05/2015, CT abdomen pelvis 03/31/2015 FINDINGS: Cardiovascular: Borderline cardiomegaly. No significant pericardial effusion or thickening. Mitral annular calcifications are identified. Minimal aortic atherosclerosis without aneurysm. Scattered three-vessel coronary arteriosclerosis. Mediastinum/Nodes: Right anterior chest wall port catheter with tip the distal SVC. Mildly enlarged left lobe of the thyroid gland,  heterogeneous and partially calcified in appearance. Coarse calcification in the right lobe of the thyroid gland. Findings may reflect a goiter. Small  mediastinal lymph nodes. Assessment of the hila is limited by lack of IV contrast. Lungs/Pleura: Small left, greater than right pleural effusions. Extensive diffuse bilateral ground-glass opacities are identified. Superimposed small 4 mm nodular opacities are identified in the lingula left upper lobe in particular. Given history of blood transfusions, findings may represent stigmata of transfusion associated lung injury leading to noncardiogenic pulmonary edema though differential possibilities may also include diffuse alveolar hemorrhage or interstitial pneumonia. Favor noncardiogenic pulmonary edema. Musculoskeletal: Acute nor aggressive osseous lesions. CT ABDOMEN PELVIS FINDINGS Hepatobiliary: New in homogeneous areas of hypodensity involving the liver are identified, in the right lobe measuring between 19 and 26 mm in diameter. Adjacent to the gallbladder is in in homogeneous hypodense masslike abnormality suspicious for large peritoneal metastatic focus, series 2/63. Exact margins are difficult to separate from the adjacent gallbladder. No biliary dilatation is identified. There is mild biliary sludge. Pancreas: Atrophic pancreas. Spleen: Normal seen. Adrenals/Urinary Tract: No adrenal mass. Tail catheter noted in the right ureter with proximal coil in the pelvis of the right kidney and distal coil in the bladder. No apparent calculus identified along the course of the right ureter. The left kidney demonstrates no obstructive uropathy or hydroureteronephrosis. Stomach/Bowel: Gastric band device is noted about the stomach near the gastroesophageal junction. The orientation of the band is within normal limits spanning the 2 o'clock to 8 o'clock position on the coronal images. Small omental fat and small bowel containing infraumbilical ventral hernia is identified  without obstruction bowel obstruction. Vascular/Lymphatic: Minimal aortic atherosclerosis. Scattered mesenteric lymph nodes are identified, the largest in the right upper quadrant measuring 15 mm short axis and stable appearance. Reproductive: Hysterectomy.  No adnexal mass. Other: Small amount of ascites in the lower pelvis. Previously identified omental caking as well as peritoneal implants are less prominent. Omental nodules persist in the left lower quadrant, series 2/91 through 94, largest measuring up to 16 mm. Musculoskeletal: No acute nor suspicious osseous abnormalities. Degenerative disc disease L2-3 and L5-S1. mild soft tissue induration along the ventral aspect of pelvis with underlying scarring. IMPRESSION: Chest CT: Diffuse ground-glass opacities are noted throughout the lungs with small 4 mm nodular density superimposed. These nodular densities are better visualized and noted in the left upper lobe and lingula. Given history of multiple transfusions a leading consideration is noncardiogenic pulmonary edema likely transfusion related within other differential possibilities including alveolar hemorrhage, interstitial pneumonia, ARDS. Non-contrast chest CT can be considered in 12 months pain high-risk patients. This recommendation follows the consensus statement: Guidelines for Management of Incidental Pulmonary Nodules Detected on CT Images: From the Fleischner Society 2017; Radiology 2017; 284:228-243. CT AP: 1. New since prior exam are hypodense lesions of the liver, some which are noted in the right hepatic lobe ranging in size from 19 mm 26 mm with adjacent lobular soft tissue masslike abnormality abutting the gallbladder. Metastatic disease may account for this appearance as well as stigmata of peritoneal carcinomatosis and tumor implants in the omentum. Mildly enlarged right upper quadrant lymph node appears stable 15 mm series 2/69. Omental nodules persist in the left lower quadrant in are  slightly better defined on current exam the prior the largest measuring up to 16 mm. 2. Interval decrease in upper abdominal ascites with small amount of ascites in pelvis. 3. Right-sided double pigtail catheter ureteral stent is in place without complicating features. 4. No evidence of bowel obstruction. 5. Postop change along the ventral abdomen and pelvis. 6. Shallow omental fat and small bowel containing infraumbilical hernia  without bowel obstruction. Electronically Signed   By: Ashley Royalty M.D.   On: 06/09/2018 20:59   Dg Chest 1 View  Result Date: 06/09/2018 CLINICAL DATA:  Dyspnea and wheezing EXAM: CHEST  1 VIEW COMPARISON:  04/17/2017 FINDINGS: Heart size is top-normal. Nonaneurysmal thoracic aorta is noted. Right IJ port catheter is noted with tip in the distal SVC-RA juncture. New bilateral in homogeneous airspace opacities are seen scattered throughout both lungs likely representing a component of mild pulmonary edema. Superimposed pneumonia would be difficult to entirely exclude especially at the lung bases. No significant pleural effusion identified. No pneumothorax. No acute osseous abnormality. IMPRESSION: Stable borderline cardiomegaly with patchy airspace opacities suspicious for mild pulmonary edema. Superimposed pneumonia would be difficult to entirely exclude. These results were discussed at the time of interpretation on 06/09/2018 at 6:16 pm with Dr. Larae Grooms Electronically Signed   By: Ashley Royalty M.D.   On: 06/09/2018 18:16   Ct Chest Wo Contrast  Result Date: 06/09/2018 CLINICAL DATA:  67 year old female with history of diastolic congestive heart failure and metastatic ovarian cancer presents with dyspnea and low hemoglobin requiring blood transfusions. EXAM: CT CHEST, ABDOMEN AND PELVIS WITHOUT CONTRAST TECHNIQUE: Multidetector CT imaging of the chest, abdomen and pelvis was performed following the standard protocol without IV contrast. COMPARISON:  Same day CXR, chest CT  04/05/2015, CT abdomen pelvis 03/31/2015 FINDINGS: Cardiovascular: Borderline cardiomegaly. No significant pericardial effusion or thickening. Mitral annular calcifications are identified. Minimal aortic atherosclerosis without aneurysm. Scattered three-vessel coronary arteriosclerosis. Mediastinum/Nodes: Right anterior chest wall port catheter with tip the distal SVC. Mildly enlarged left lobe of the thyroid gland, heterogeneous and partially calcified in appearance. Coarse calcification in the right lobe of the thyroid gland. Findings may reflect a goiter. Small mediastinal lymph nodes. Assessment of the hila is limited by lack of IV contrast. Lungs/Pleura: Small left, greater than right pleural effusions. Extensive diffuse bilateral ground-glass opacities are identified. Superimposed small 4 mm nodular opacities are identified in the lingula left upper lobe in particular. Given history of blood transfusions, findings may represent stigmata of transfusion associated lung injury leading to noncardiogenic pulmonary edema though differential possibilities may also include diffuse alveolar hemorrhage or interstitial pneumonia. Favor noncardiogenic pulmonary edema. Musculoskeletal: Acute nor aggressive osseous lesions. CT ABDOMEN PELVIS FINDINGS Hepatobiliary: New in homogeneous areas of hypodensity involving the liver are identified, in the right lobe measuring between 19 and 26 mm in diameter. Adjacent to the gallbladder is in in homogeneous hypodense masslike abnormality suspicious for large peritoneal metastatic focus, series 2/63. Exact margins are difficult to separate from the adjacent gallbladder. No biliary dilatation is identified. There is mild biliary sludge. Pancreas: Atrophic pancreas. Spleen: Normal seen. Adrenals/Urinary Tract: No adrenal mass. Tail catheter noted in the right ureter with proximal coil in the pelvis of the right kidney and distal coil in the bladder. No apparent calculus identified  along the course of the right ureter. The left kidney demonstrates no obstructive uropathy or hydroureteronephrosis. Stomach/Bowel: Gastric band device is noted about the stomach near the gastroesophageal junction. The orientation of the band is within normal limits spanning the 2 o'clock to 8 o'clock position on the coronal images. Small omental fat and small bowel containing infraumbilical ventral hernia is identified without obstruction bowel obstruction. Vascular/Lymphatic: Minimal aortic atherosclerosis. Scattered mesenteric lymph nodes are identified, the largest in the right upper quadrant measuring 15 mm short axis and stable appearance. Reproductive: Hysterectomy.  No adnexal mass. Other: Small amount of ascites in the lower pelvis.  Previously identified omental caking as well as peritoneal implants are less prominent. Omental nodules persist in the left lower quadrant, series 2/91 through 94, largest measuring up to 16 mm. Musculoskeletal: No acute nor suspicious osseous abnormalities. Degenerative disc disease L2-3 and L5-S1. mild soft tissue induration along the ventral aspect of pelvis with underlying scarring. IMPRESSION: Chest CT: Diffuse ground-glass opacities are noted throughout the lungs with small 4 mm nodular density superimposed. These nodular densities are better visualized and noted in the left upper lobe and lingula. Given history of multiple transfusions a leading consideration is noncardiogenic pulmonary edema likely transfusion related within other differential possibilities including alveolar hemorrhage, interstitial pneumonia, ARDS. Non-contrast chest CT can be considered in 12 months pain high-risk patients. This recommendation follows the consensus statement: Guidelines for Management of Incidental Pulmonary Nodules Detected on CT Images: From the Fleischner Society 2017; Radiology 2017; 284:228-243. CT AP: 1. New since prior exam are hypodense lesions of the liver, some which are  noted in the right hepatic lobe ranging in size from 19 mm 26 mm with adjacent lobular soft tissue masslike abnormality abutting the gallbladder. Metastatic disease may account for this appearance as well as stigmata of peritoneal carcinomatosis and tumor implants in the omentum. Mildly enlarged right upper quadrant lymph node appears stable 15 mm series 2/69. Omental nodules persist in the left lower quadrant in are slightly better defined on current exam the prior the largest measuring up to 16 mm. 2. Interval decrease in upper abdominal ascites with small amount of ascites in pelvis. 3. Right-sided double pigtail catheter ureteral stent is in place without complicating features. 4. No evidence of bowel obstruction. 5. Postop change along the ventral abdomen and pelvis. 6. Shallow omental fat and small bowel containing infraumbilical hernia without bowel obstruction. Electronically Signed   By: Ashley Royalty M.D.   On: 06/09/2018 20:59    ASSESSMENT AND PLAN:   Active Problems:   Acute respiratory failure (HCC)   HCAP (healthcare-associated pneumonia)   Acute pulmonary edema (HCC)   Goals of care, counseling/discussion   #Acute hypoxic respiratory failure secondary to healthcare associated pneumonia and pulmonary edema Admit to intensive care unit BiPAP- converted to nasal canula. Empiric IV antibiotics cefepime and vancomycin  #Symptomatic anemia-on chronic anemia probably chemotherapy induced Hemoglobin at 4.3 Patient denies any active bleeding Monitor hemoglobin hematocrit closely No need for GI consult. Pt had a EGD  Last month at [facility].  #Metastatic ovarian cancer stage IV Currently on chemotherapy is at [facility] Patient will be transferred to [facility] when bed is available, on waiting list  #Diabetes mellitus insulin requiring Sliding scale insulin  #acute on ch diastolic chf  on BiPAP and n.p.o. Monitor intake and output Hold home medications by mouth   All the  records are reviewed and case discussed with Care Management/Social Workerr. Management plans discussed with the patient, family and they are in agreement.  CODE STATUS: Full.  TOTAL TIME TAKING CARE OF THIS PATIENT: 35 minutes.    POSSIBLE D/C IN 1-2 DAYS, DEPENDING ON CLINICAL CONDITION.   Vaughan Basta M.D on 06/10/2018   Between 7am to 6pm - Pager - [phone]  After 6pm go to [email or web address] - password [email or web address] [place] [facility]  Office  [phone]  CC: Primary care physician; Lucille Passy, MD  Note: This dictation was prepared with Dragon dictation along with smaller phrase technology. Any transcriptional errors that result from this process are unintentional.

## 2018-06-10 NOTE — Progress Notes (Signed)
Consult was cancelled .   Please call if further evaluation needed  Dr Jonathon Bellows MD,MRCP [facility]) Gastroenterology/Hepatology Pager: [phone]

## 2018-06-11 ENCOUNTER — Inpatient Hospital Stay: Payer: Medicare Other

## 2018-06-11 ENCOUNTER — Other Ambulatory Visit: Payer: Self-pay

## 2018-06-11 LAB — COMPREHENSIVE METABOLIC PANEL
ALBUMIN: 2.8 g/dL — AB (ref 3.5–5.0)
ALK PHOS: 77 U/L (ref 38–126)
ALT: 24 U/L (ref 0–44)
AST: 25 U/L (ref 15–41)
Anion gap: 10 (ref 5–15)
BILIRUBIN TOTAL: 0.5 mg/dL (ref 0.3–1.2)
BUN: 32 mg/dL — AB (ref 8–23)
CALCIUM: 7.8 mg/dL — AB (ref 8.9–10.3)
CO2: 26 mmol/L (ref 22–32)
CREATININE: 1.91 mg/dL — AB (ref 0.44–1.00)
Chloride: 102 mmol/L (ref 98–111)
GFR calc Af Amer: 30 mL/min — ABNORMAL LOW (ref >60)
GFR calc non Af Amer: 26 mL/min — ABNORMAL LOW (ref >60)
GLUCOSE: 219 mg/dL — AB (ref 70–99)
Potassium: 4.5 mmol/L (ref 3.5–5.1)
SODIUM: 138 mmol/L (ref 135–145)
TOTAL PROTEIN: 6.3 g/dL — AB (ref 6.5–8.1)

## 2018-06-11 LAB — CBC WITH DIFFERENTIAL/PLATELET
BASOS PCT: 0 %
Basophils Absolute: 0 10*3/uL (ref 0–0.1)
Eosinophils Absolute: 0 10*3/uL (ref 0–0.7)
Eosinophils Relative: 0 %
HEMATOCRIT: 24.2 % — AB (ref 35.0–47.0)
HEMOGLOBIN: 8.8 g/dL — AB (ref 12.0–16.0)
LYMPHS ABS: 0.4 10*3/uL — AB (ref 1.0–3.6)
Lymphocytes Relative: 5 %
MCH: 32.4 pg (ref 26.0–34.0)
MCHC: 36.5 g/dL — AB (ref 32.0–36.0)
MCV: 89 fL (ref 80.0–100.0)
MONOS PCT: 4 %
Monocytes Absolute: 0.4 10*3/uL (ref 0.2–0.9)
NEUTROS ABS: 7.6 10*3/uL — AB (ref 1.4–6.5)
NEUTROS PCT: 91 %
Platelets: 265 10*3/uL (ref 150–440)
RBC: 2.72 MIL/uL — AB (ref 3.80–5.20)
RDW: 15.9 % — ABNORMAL HIGH (ref 11.5–14.5)
WBC: 8.5 10*3/uL (ref 3.6–11.0)

## 2018-06-11 LAB — MAGNESIUM: Magnesium: 1.5 mg/dL — ABNORMAL LOW (ref 1.7–2.4)

## 2018-06-11 LAB — HIV ANTIBODY (ROUTINE TESTING W REFLEX): HIV SCREEN 4TH GENERATION: NONREACTIVE

## 2018-06-11 LAB — GLUCOSE, CAPILLARY
Glucose-Capillary: 168 mg/dL — ABNORMAL HIGH (ref 70–99)
Glucose-Capillary: 166 mg/dL — ABNORMAL HIGH (ref 70–99)

## 2018-06-11 MED ORDER — CEFDINIR 300 MG PO CAPS
300.0000 mg | ORAL_CAPSULE | Freq: Two times a day (BID) | ORAL | Status: DC
  Filled 2018-06-11 (×2): qty 1

## 2018-06-11 MED ORDER — MELATONIN 5 MG PO TABS
5.0000 mg | ORAL_TABLET | Freq: Every day | ORAL | Status: DC
  Administered 2018-06-11: 5 mg via ORAL
  Filled 2018-06-11 (×2): qty 1

## 2018-06-11 MED ORDER — ACETAMINOPHEN 325 MG PO TABS: 650.0000 mg | ORAL_TABLET | Freq: Four times a day (QID) | ORAL | Status: AC | PRN

## 2018-06-11 MED ORDER — FERROUS SULFATE 325 (65 FE) MG PO TBEC: 325.0000 mg | DELAYED_RELEASE_TABLET | Freq: Two times a day (BID) | ORAL | 3 refills | Status: AC

## 2018-06-11 MED ORDER — SODIUM CHLORIDE 0.9 % IV SOLN
INTRAVENOUS | Status: DC | PRN
  Administered 2018-06-11: 500 mL via INTRAVENOUS

## 2018-06-11 MED ORDER — MAGNESIUM SULFATE 2 GM/50ML IV SOLN
2.0000 g | Freq: Once | INTRAVENOUS | Status: AC
  Administered 2018-06-11: 2 g via INTRAVENOUS
  Filled 2018-06-11: qty 50

## 2018-06-11 MED ORDER — CEFDINIR 300 MG PO CAPS: 300.0000 mg | ORAL_CAPSULE | Freq: Two times a day (BID) | ORAL | 0 refills | Status: DC

## 2018-06-11 NOTE — Progress Notes (Signed)
CBG 168 per Gerald Stabs NT. Glucometer not synching.

## 2018-06-11 NOTE — Progress Notes (Signed)
Physical Therapy Treatment Patient Details Name: [name] MRN: [id number] DOB: 07/02/1951 Today's Date: 06/11/2018    History of Present Illness presented to ER secondary to respiratory distress; admitted secondary to acute/chronic respiratory failure secondary to HCAP, pulmonary edema and symptomatic anemia (initial HgB 4.3, current 9.1).  Initially requiring BiPAP, now weaned to 2L supplemental O2 via  and then on room air by afternoon of 06/10/18.  Pt with metastatic ovarian cancer and to start chemo at [facility] on [facility] 06/12/18.    PT Comments    Pt on room air.  Able to transition out of bed and ambulate around unit with slow but steady gait with walker.  No LOB or buckling noted.  Sats 92% on room air upon return to bed.  Pt has and is encouraged to use walker upon discharge.   Follow Up Recommendations  Home health PT     Equipment Recommendations       Recommendations for Other Services       Precautions / Restrictions Precautions Precautions: Fall Restrictions Weight Bearing Restrictions: No    Mobility  Bed Mobility Overal bed mobility: Modified Independent                Transfers Overall transfer level: Needs assistance   Transfers: Sit to/from Stand Sit to Stand: Min guard;Min assist            Ambulation/Gait Ambulation/Gait assistance: Min guard;Min assist Gait Distance (Feet): 180 Feet Assistive device: Rolling walker (2 wheeled) Gait Pattern/deviations: Step-through pattern Gait velocity: decreased   General Gait Details: sats 92% after gait on room air.   Stairs             Wheelchair Mobility    Modified Rankin (Stroke Patients Only)       Balance Overall balance assessment: Needs assistance Sitting-balance support: No upper extremity supported;Feet supported Sitting balance-Leahy Scale: Good     Standing balance support: Bilateral upper extremity supported Standing balance-Leahy Scale: Fair Standing balance  comment: relies on walker for gait.                            Cognition Arousal/Alertness: Awake/alert Behavior During Therapy: WFL for tasks assessed/performed Overall Cognitive Status: Within Functional Limits for tasks assessed                                        Exercises      General Comments        Pertinent Vitals/Pain Pain Assessment: No/denies pain    Home Living                      Prior Function            PT Goals (current goals can now be found in the care plan section) Progress towards PT goals: Progressing toward goals    Frequency    Min 2X/week      PT Plan Current plan remains appropriate    Co-evaluation              AM-PAC PT "6 Clicks" Daily Activity  Outcome Measure  Difficulty turning over in bed (including adjusting bedclothes, sheets and blankets)?: None Difficulty moving from lying on back to sitting on the side of the bed? : None Difficulty sitting down on and standing up from a chair with arms (  e.g., wheelchair, bedside commode, etc,.)?: None Help needed moving to and from a bed to chair (including a wheelchair)?: A Little Help needed walking in hospital room?: A Little Help needed climbing 3-5 steps with a railing? : A Little 6 Click Score: 21    End of Session Equipment Utilized During Treatment: Gait belt Activity Tolerance: Patient tolerated treatment well Patient left: in bed;with bed alarm set;with call bell/phone within reach;with family/visitor present         Time: 0981-1914 PT Time Calculation (min) (ACUTE ONLY): 10 min  Charges:  $Gait Training: 8-22 mins                     Chesley Noon, PTA 06/11/18, 12:13 PM

## 2018-06-11 NOTE — Discharge Instructions (Signed)
Follow-up with primary care physician in 2 to 3 days Follow-up with the [facility] as an outpatient tomorrow as scheduled

## 2018-06-11 NOTE — Discharge Summary (Addendum)
[facility] [place] NAME: [name]    MR#:  [id number]  DATE OF BIRTH:  08-May-1951  DATE OF ADMISSION:  06/09/2018 ADMITTING PHYSICIAN: Nicholes Mango, MD  DATE OF DISCHARGE: 06/11/18   PRIMARY CARE PHYSICIAN: Lucille Passy, MD    ADMISSION DIAGNOSIS:  Bronchospasm [J98.01] HCAP (healthcare-associated pneumonia) [J18.9] Symptomatic anemia [D64.9]  DISCHARGE DIAGNOSIS:  Active Problems:   Acute respiratory failure ([place])   HCAP (healthcare-associated pneumonia)   Acute pulmonary edema (H[facility])   Goals of care, counseling/discussion Symptomatic anemia Malignant ovarian cancer  SECONDARY DIAGNOSIS:   Past Medical History:  Diagnosis Date  . Acute upper respiratory infections of unspecified site   . Anemia, unspecified   . Anxiety state, unspecified   . Cellulitis   . Chronic diastolic heart failure ([place])   . Congestive heart failure, unspecified   . Contact dermatitis and other eczema, due to unspecified cause   . Depressive disorder, not elsewhere classified   . Diabetes mellitus   . Essential hypertension, benign   . Mastoiditis   . Other and unspecified hyperlipidemia   . Other diseases of lung, not elsewhere classified   . Other dyspnea and respiratory abnormality   . Recent retinal detachment, partial, with giant tear   . Type II or unspecified type diabetes mellitus with peripheral circulatory disorders, not stated as uncontrolled(250.70)   . Unspecified asthma, with exacerbation   . Unspecified urinary incontinence   . Wheezing     HOSPITAL COURSE:   HPI  [name]  is a 67 y.o. 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   Patient was saturating only 50% in the field when EMS placed to the field.  Patient was placed on CPAP and she was saturating at mid 80s and she is brought into the emergency department.  Chest x-ray with patchy airspace opacities and pulmonary edema.  Hemoglobin is at 4.3.  Patient denies any active bleeding.  2 units of blood transition was ordered by the emergency department physician.  Patient feels better on BiPAP in the emergency department.  Husband and 2 sons are at bedside.  The plan is to transfer the patient to [facility] once bed is available at [facility]   #Acute hypoxic respiratory failure secondary to healthcare associated pneumonia and pulmonary edema Admitted pt  to intensive care unit BiPAP- converted to nasal canula.- now RA  Clinically improved Empiric IV antibiotics cefepime and vancomycin provided.  Clinically better.  Discharge patient with p.o. Omnicef and more doses  #Symptomatic anemia-on chronic anemia probably chemotherapy induced Hemoglobin at 4.3 at the time of admission status post 2 units of blood transfusion and hemoglobin is at 8.8 today Patient denies any active bleeding Monitor hemoglobin hematocrit closely No need for GI consult. Pt had a EGD  Last month at [facility].  #Metastatic ovarian cancer stage IV Currently on chemotherapy is at [facility] cancer center Patient will follow up with [facility] as an outpatient  #Diabetes mellitus insulin requiring Sliding scale insulin  Lantus 30 units daily Holding metformin  #acute on ch diastolic chf on BiPAP and clinically improved Monitor intake and output Lasix, patient's home medication by mouth Holding lisinopril at this time Outpatient follow-up with [facility] nephrology to monitor  renal function  Evaluated with physical therapy no PT recommendations,DC HOME   DISCHARGE CONDITIONS:   Stable  CONSULTS OBTAINED:  Treatment Team:  Earlie Server, MD   PROCEDURES BiPAP  DRUG ALLERGIES:   Allergies  Allergen  Reactions  . Aspirin Other (See Comments)    asthma  . Lorazepam Shortness Of Breath  . Codeine Nausea And Vomiting    DISCHARGE MEDICATIONS:   Allergies as of 06/11/2018      Reactions   Aspirin Other (See Comments)   asthma   Lorazepam Shortness Of Breath   Codeine Nausea And Vomiting      Medication List    STOP taking these medications   doxycycline 100 MG tablet Commonly known as:  VIBRA-TABS   lisinopril 20 MG tablet Commonly known as:  PRINIVIL,ZESTRIL   metFORMIN 500 MG tablet Commonly known as:  GLUCOPHAGE   ranitidine 150 MG tablet Commonly known as:  ZANTAC     TAKE these medications   acetaminophen 325 MG tablet Commonly known as:  TYLENOL Take 2 tablets (650 mg total) by mouth every 6 (six) hours as needed for mild pain (or Fever >/= 101).   amiodarone 200 MG tablet Commonly known as:  PACERONE Take 200 mg by mouth daily.   atorvastatin 40 MG tablet Commonly known as:  LIPITOR Take 40 mg by mouth daily.   busPIRone 10 MG tablet Commonly known as:  BUSPAR TAKE 1 TABLET BY MOUTH AT BEDTIME   cefdinir 300 MG capsule Commonly known as:  OMNICEF Take 1 capsule (300 mg total) by mouth every 12 (twelve) hours.   diltiazem 240 MG 24 hr capsule Commonly known as:  CARDIZEM CD Take 240 mg by mouth daily.   ELIQUIS 5 MG Tabs tablet Generic drug:  apixaban Take 5 mg by mouth 2 (two) times daily.   ferrous sulfate 325 (65 FE) MG EC tablet Take 1 tablet (325 mg total) by mouth 2 (two) times daily.   FLUoxetine 20 MG capsule Commonly known as:  PROZAC TAKE 3 CAPSULES BY MOUTH ONCE DAILY   furosemide 20 MG tablet Commonly known as:  LASIX Take 1 tablet (20 mg total) by mouth every morning.   glipiZIDE 5 MG tablet Commonly known as:  GLUCOTROL TAKE 1 TABLET BY MOUTH TWICE DAILY BEFORE MEAL(S) What changed:  Another medication with the same name was removed. Continue taking this medication, and follow the directions you see here.   glucose blood  test strip USE ONE STRIP TO CHECK GLUCOSE THREE TIMES DAILY AND  AS  DIRECTED   LANTUS 100 UNIT/ML injection Generic drug:  insulin glargine INJECT 30 UNITS SUBCUTANEOUSLY IN THE EVENING   ondansetron 8 MG tablet Commonly known as:  ZOFRAN Take 1 tablet (8 mg total) by mouth every 8 (eight) hours as needed for nausea or vomiting. Will not make drowsy.   ONE TOUCH ULTRA 2 w/Device Kit Check blood sugar three times a day and as directed. Dx G99.242   ONETOUCH DELICA LANCETS FINE Misc USE TO TEST BLOOD SUGAR THREE TIMES DAILY AND AS DIRECTED   pantoprazole 40 MG tablet Commonly known as:  PROTONIX Take 1 tablet by mouth 2 (two) times daily.   tamsulosin 0.4 MG Caps capsule Commonly known as:  FLOMAX Take 1 capsule by mouth daily.   valACYclovir 1000 MG tablet Commonly known as:  VALTREX Take 1,000 mg by mouth daily.        DISCHARGE INSTRUCTIONS:  Follow-up with primary care physician in 2 to 3 days Follow-up  with the [facility] cancer center as an outpatient tomorrow as scheduled   DIET:  Cardiac diet and Diabetic diet  DISCHARGE CONDITION:  Fair  ACTIVITY:  Activity as tolerated  OXYGEN:  Home Oxygen: No.   Oxygen Delivery: room air  DISCHARGE LOCATION:  home   If you experience worsening of your admission symptoms, develop shortness of breath, life threatening emergency, suicidal or homicidal thoughts you must seek medical attention immediately by calling 911 or calling your MD immediately  if symptoms less severe.  You Must read complete instructions/literature along with all the possible adverse reactions/side effects for all the Medicines you take and that have been prescribed to you. Take any new Medicines after you have completely understood and accpet all the possible adverse reactions/side effects.   Please note  You were cared for by a hospitalist during your hospital stay. If you have any questions about your discharge medications or the care you  received while you were in the hospital after you are discharged, you can call the unit and asked to speak with the hospitalist on call if the hospitalist that took care of you is not available. Once you are discharged, your primary care physician will handle any further medical issues. Please note that NO REFILLS for any discharge medications will be authorized once you are discharged, as it is imperative that you return to your primary care physician (or establish a relationship with a primary care physician if you do not have one) for your aftercare needs so that they can reassess your need for medications and monitor your lab values.     Today  Chief Complaint  Patient presents with  . Respiratory Distress   Patient is feeling much better.  On room air.  Hemoglobin is stable and denies any complaints.  Ambulated in the hall with the help of physical therapy with no shortness of breath, wants to go home  ROS: CONSTITUTIONAL: Denies fevers, chills. Denies any fatigue, weakness.  EYES: Denies blurry vision, double vision, eye pain. EARS, NOSE, THROAT: Denies tinnitus, ear pain, hearing loss. RESPIRATORY: Denies cough, wheeze, shortness of breath.  CARDIOVASCULAR: Denies chest pain, palpitations, edema.  GASTROINTESTINAL: Denies nausea, vomiting, diarrhea, abdominal pain. Denies bright red blood per rectum. GENITOURINARY: Denies dysuria, hematuria. ENDOCRINE: Denies nocturia or thyroid problems. HEMATOLOGIC AND LYMPHATIC: Denies easy bruising or bleeding. SKIN: Denies rash or lesion. MUSCULOSKELETAL: Denies pain in neck, back, shoulder, knees, hips or arthritic symptoms.  NEUROLOGIC: Denies paralysis, paresthesias.  PSYCHIATRIC: Denies anxiety or depressive symptoms.   VITAL SIGNS:  Blood pressure (!) 131/55, pulse 74, temperature 98.2 F (36.8 C), temperature source Oral, resp. rate 19, height 5' 5"  (1.651 m), weight 112 kg, SpO2 95 %.  I/O:    Intake/Output Summary (Last 24 hours)  at 06/11/2018 1219 Last data filed at 06/11/2018 1202 Gross per 24 hour  Intake 1040 ml  Output 4000 ml  Net -2960 ml    PHYSICAL EXAMINATION:  GENERAL:  67 y.o.-year-old patient lying in the bed with no acute distress.  EYES: Pupils equal, round, reactive to light and accommodation. No scleral icterus. Extraocular muscles intact.  HEENT: Head atraumatic, normocephalic. Oropharynx and nasopharynx clear.  NECK:  Supple, no jugular venous distention. No thyroid enlargement, no tenderness.  LUNGS: Normal breath sounds bilaterally, no wheezing, rales,rhonchi or crepitation. No use of accessory muscles of respiration.  CARDIOVASCULAR: S1, S2 normal. No murmurs, rubs, or gallops.  ABDOMEN: Soft, non-tender, non-distended. Bowel sounds present.  EXTREMITIES: No pedal edema, cyanosis, or  clubbing.  NEUROLOGIC: Cranial nerves II through XII are intact. Muscle strength at her baseline in all extremities. Sensation intact. Gait not checked.  PSYCHIATRIC: The patient is alert and oriented x 3.  SKIN: No obvious rash, lesion, or ulcer.   DATA REVIEW:   CBC Recent Labs  Lab 06/11/18 0415  WBC 8.5  HGB 8.8*  HCT 24.2*  PLT 265    Chemistries  Recent Labs  Lab 06/11/18 0415  NA 138  K 4.5  CL 102  CO2 26  GLUCOSE 219*  BUN 32*  CREATININE 1.91*  CALCIUM 7.8*  MG 1.5*  AST 25  ALT 24  ALKPHOS 77  BILITOT 0.5    Cardiac Enzymes Recent Labs  Lab 06/09/18 1725  TROPONINI <0.03    Microbiology Results  Results for orders placed or performed during the hospital encounter of 06/09/18  Blood Culture (routine x 2)     Status: None (Preliminary result)   Collection Time: 06/09/18  7:49 PM  Result Value Ref Range Status   Specimen Description BLOOD RW  Final   Special Requests   Final    BOTTLES DRAWN AEROBIC AND ANAEROBIC Blood Culture adequate volume   Culture   Final    NO GROWTH 2 DAYS Performed at [facility], [address] [place], [place] [address]     Report Status PENDING  Incomplete  Blood Culture (routine x 2)     Status: None (Preliminary result)   Collection Time: 06/09/18  7:50 PM  Result Value Ref Range Status   Specimen Description BLOOD RAC  Final   Special Requests   Final    BOTTLES DRAWN AEROBIC AND ANAEROBIC Blood Culture adequate volume   Culture   Final    NO GROWTH 2 DAYS Performed at [facility], [address] [place], [place] [address]    Report Status PENDING  Incomplete  MRSA PCR Screening     Status: None   Collection Time: 06/09/18  9:06 PM  Result Value Ref Range Status   MRSA by PCR NEGATIVE NEGATIVE Final    Comment:        The GeneXpert MRSA Assay (FDA approved for NASAL specimens only), is one component of a comprehensive MRSA colonization surveillance program. It is not intended to diagnose MRSA infection nor to guide or monitor treatment for MRSA infections. Performed at [facility], [address] [place], [place] [address]     RADIOLOGY:  Ct Abdomen Pelvis Wo Contrast  Result Date: 06/09/2018 CLINICAL DATA:  67 year old female with history of diastolic congestive heart failure and metastatic ovarian cancer presents with dyspnea and low hemoglobin requiring blood transfusions. EXAM: CT CHEST, ABDOMEN AND PELVIS WITHOUT CONTRAST TECHNIQUE: Multidetector CT imaging of the chest, abdomen and pelvis was performed following the standard protocol without IV contrast. COMPARISON:  Same day CXR, chest CT 04/05/2015, CT abdomen pelvis 03/31/2015 FINDINGS: Cardiovascular: Borderline cardiomegaly. No significant pericardial effusion or thickening. Mitral annular calcifications are identified. Minimal aortic atherosclerosis without aneurysm. Scattered three-vessel coronary arteriosclerosis. Mediastinum/Nodes: Right anterior chest wall port catheter with tip the distal SVC. Mildly enlarged left lobe of the thyroid gland, heterogeneous and partially calcified in appearance. Coarse  calcification in the right lobe of the thyroid gland. Findings may reflect a goiter. Small mediastinal lymph nodes. Assessment of the hila is limited by lack of IV contrast. Lungs/Pleura: Small left, greater than right pleural effusions. Extensive diffuse bilateral ground-glass opacities are identified. Superimposed small 4 mm nodular opacities are identified  in the lingula left upper lobe in particular. Given history of blood transfusions, findings may represent stigmata of transfusion associated lung injury leading to noncardiogenic pulmonary edema though differential possibilities may also include diffuse alveolar hemorrhage or interstitial pneumonia. Favor noncardiogenic pulmonary edema. Musculoskeletal: Acute nor aggressive osseous lesions. CT ABDOMEN PELVIS FINDINGS Hepatobiliary: New in homogeneous areas of hypodensity involving the liver are identified, in the right lobe measuring between 19 and 26 mm in diameter. Adjacent to the gallbladder is in in homogeneous hypodense masslike abnormality suspicious for large peritoneal metastatic focus, series 2/63. Exact margins are difficult to separate from the adjacent gallbladder. No biliary dilatation is identified. There is mild biliary sludge. Pancreas: Atrophic pancreas. Spleen: Normal seen. Adrenals/Urinary Tract: No adrenal mass. Tail catheter noted in the right ureter with proximal coil in the pelvis of the right kidney and distal coil in the bladder. No apparent calculus identified along the course of the right ureter. The left kidney demonstrates no obstructive uropathy or hydroureteronephrosis. Stomach/Bowel: Gastric band device is noted about the stomach near the gastroesophageal junction. The orientation of the band is within normal limits spanning the 2 o'clock to 8 o'clock position on the coronal images. Small omental fat and small bowel containing infraumbilical ventral hernia is identified without obstruction bowel obstruction. Vascular/Lymphatic:  Minimal aortic atherosclerosis. Scattered mesenteric lymph nodes are identified, the largest in the right upper quadrant measuring 15 mm short axis and stable appearance. Reproductive: Hysterectomy.  No adnexal mass. Other: Small amount of ascites in the lower pelvis. Previously identified omental caking as well as peritoneal implants are less prominent. Omental nodules persist in the left lower quadrant, series 2/91 through 94, largest measuring up to 16 mm. Musculoskeletal: No acute nor suspicious osseous abnormalities. Degenerative disc disease L2-3 and L5-S1. mild soft tissue induration along the ventral aspect of pelvis with underlying scarring. IMPRESSION: Chest CT: Diffuse ground-glass opacities are noted throughout the lungs with small 4 mm nodular density superimposed. These nodular densities are better visualized and noted in the left upper lobe and lingula. Given history of multiple transfusions a leading consideration is noncardiogenic pulmonary edema likely transfusion related within other differential possibilities including alveolar hemorrhage, interstitial pneumonia, ARDS. Non-contrast chest CT can be considered in 12 months pain high-risk patients. This recommendation follows the consensus statement: Guidelines for Management of Incidental Pulmonary Nodules Detected on CT Images: From the Fleischner Society 2017; Radiology 2017; 284:228-243. CT AP: 1. New since prior exam are hypodense lesions of the liver, some which are noted in the right hepatic lobe ranging in size from 19 mm 26 mm with adjacent lobular soft tissue masslike abnormality abutting the gallbladder. Metastatic disease may account for this appearance as well as stigmata of peritoneal carcinomatosis and tumor implants in the omentum. Mildly enlarged right upper quadrant lymph node appears stable 15 mm series 2/69. Omental nodules persist in the left lower quadrant in are slightly better defined on current exam the prior the largest  measuring up to 16 mm. 2. Interval decrease in upper abdominal ascites with small amount of ascites in pelvis. 3. Right-sided double pigtail catheter ureteral stent is in place without complicating features. 4. No evidence of bowel obstruction. 5. Postop change along the ventral abdomen and pelvis. 6. Shallow omental fat and small bowel containing infraumbilical hernia without bowel obstruction. Electronically Signed   By: Ashley Royalty M.D.   On: 06/09/2018 20:59   Dg Chest 1 View  Result Date: 06/09/2018 CLINICAL DATA:  Dyspnea and wheezing EXAM: CHEST  1  VIEW COMPARISON:  04/17/2017 FINDINGS: Heart size is top-normal. Nonaneurysmal thoracic aorta is noted. Right IJ port catheter is noted with tip in the distal SVC-RA juncture. New bilateral in homogeneous airspace opacities are seen scattered throughout both lungs likely representing a component of mild pulmonary edema. Superimposed pneumonia would be difficult to entirely exclude especially at the lung bases. No significant pleural effusion identified. No pneumothorax. No acute osseous abnormality. IMPRESSION: Stable borderline cardiomegaly with patchy airspace opacities suspicious for mild pulmonary edema. Superimposed pneumonia would be difficult to entirely exclude. These results were discussed at the time of interpretation on 06/09/2018 at 6:16 pm with Dr. Larae Grooms Electronically Signed   By: Ashley Royalty M.D.   On: 06/09/2018 18:16   Ct Chest Wo Contrast  Result Date: 06/09/2018 CLINICAL DATA:  67 year old female with history of diastolic congestive heart failure and metastatic ovarian cancer presents with dyspnea and low hemoglobin requiring blood transfusions. EXAM: CT CHEST, ABDOMEN AND PELVIS WITHOUT CONTRAST TECHNIQUE: Multidetector CT imaging of the chest, abdomen and pelvis was performed following the standard protocol without IV contrast. COMPARISON:  Same day CXR, chest CT 04/05/2015, CT abdomen pelvis 03/31/2015 FINDINGS:  Cardiovascular: Borderline cardiomegaly. No significant pericardial effusion or thickening. Mitral annular calcifications are identified. Minimal aortic atherosclerosis without aneurysm. Scattered three-vessel coronary arteriosclerosis. Mediastinum/Nodes: Right anterior chest wall port catheter with tip the distal SVC. Mildly enlarged left lobe of the thyroid gland, heterogeneous and partially calcified in appearance. Coarse calcification in the right lobe of the thyroid gland. Findings may reflect a goiter. Small mediastinal lymph nodes. Assessment of the hila is limited by lack of IV contrast. Lungs/Pleura: Small left, greater than right pleural effusions. Extensive diffuse bilateral ground-glass opacities are identified. Superimposed small 4 mm nodular opacities are identified in the lingula left upper lobe in particular. Given history of blood transfusions, findings may represent stigmata of transfusion associated lung injury leading to noncardiogenic pulmonary edema though differential possibilities may also include diffuse alveolar hemorrhage or interstitial pneumonia. Favor noncardiogenic pulmonary edema. Musculoskeletal: Acute nor aggressive osseous lesions. CT ABDOMEN PELVIS FINDINGS Hepatobiliary: New in homogeneous areas of hypodensity involving the liver are identified, in the right lobe measuring between 19 and 26 mm in diameter. Adjacent to the gallbladder is in in homogeneous hypodense masslike abnormality suspicious for large peritoneal metastatic focus, series 2/63. Exact margins are difficult to separate from the adjacent gallbladder. No biliary dilatation is identified. There is mild biliary sludge. Pancreas: Atrophic pancreas. Spleen: Normal seen. Adrenals/Urinary Tract: No adrenal mass. Tail catheter noted in the right ureter with proximal coil in the pelvis of the right kidney and distal coil in the bladder. No apparent calculus identified along the course of the right ureter. The left kidney  demonstrates no obstructive uropathy or hydroureteronephrosis. Stomach/Bowel: Gastric band device is noted about the stomach near the gastroesophageal junction. The orientation of the band is within normal limits spanning the 2 o'clock to 8 o'clock position on the coronal images. Small omental fat and small bowel containing infraumbilical ventral hernia is identified without obstruction bowel obstruction. Vascular/Lymphatic: Minimal aortic atherosclerosis. Scattered mesenteric lymph nodes are identified, the largest in the right upper quadrant measuring 15 mm short axis and stable appearance. Reproductive: Hysterectomy.  No adnexal mass. Other: Small amount of ascites in the lower pelvis. Previously identified omental caking as well as peritoneal implants are less prominent. Omental nodules persist in the left lower quadrant, series 2/91 through 94, largest measuring up to 16 mm. Musculoskeletal: No acute nor suspicious osseous abnormalities.  Degenerative disc disease L2-3 and L5-S1. mild soft tissue induration along the ventral aspect of pelvis with underlying scarring. IMPRESSION: Chest CT: Diffuse ground-glass opacities are noted throughout the lungs with small 4 mm nodular density superimposed. These nodular densities are better visualized and noted in the left upper lobe and lingula. Given history of multiple transfusions a leading consideration is noncardiogenic pulmonary edema likely transfusion related within other differential possibilities including alveolar hemorrhage, interstitial pneumonia, ARDS. Non-contrast chest CT can be considered in 12 months pain high-risk patients. This recommendation follows the consensus statement: Guidelines for Management of Incidental Pulmonary Nodules Detected on CT Images: From the Fleischner Society 2017; Radiology 2017; 284:228-243. CT AP: 1. New since prior exam are hypodense lesions of the liver, some which are noted in the right hepatic lobe ranging in size from 19  mm 26 mm with adjacent lobular soft tissue masslike abnormality abutting the gallbladder. Metastatic disease may account for this appearance as well as stigmata of peritoneal carcinomatosis and tumor implants in the omentum. Mildly enlarged right upper quadrant lymph node appears stable 15 mm series 2/69. Omental nodules persist in the left lower quadrant in are slightly better defined on current exam the prior the largest measuring up to 16 mm. 2. Interval decrease in upper abdominal ascites with small amount of ascites in pelvis. 3. Right-sided double pigtail catheter ureteral stent is in place without complicating features. 4. No evidence of bowel obstruction. 5. Postop change along the ventral abdomen and pelvis. 6. Shallow omental fat and small bowel containing infraumbilical hernia without bowel obstruction. Electronically Signed   By: Ashley Royalty M.D.   On: 06/09/2018 20:59   Dg Chest Port 1 View  Result Date: 06/11/2018 CLINICAL DATA:  Shortness of breath. EXAM: PORTABLE CHEST 1 VIEW COMPARISON:  Radiograph of June 09, 2018. FINDINGS: Stable cardiomediastinal silhouette. Right internal jugular Port-A-Cath is unchanged in position. Bilateral lung opacities noted on prior exam has significantly improved. No pneumothorax is noted. Fluid is noted in right major fissure. No consolidative process is noted. Bony thorax is unremarkable. IMPRESSION: Fluid seen in right major fissure. Bilateral lung opacities noted on prior exam have significantly improved. Electronically Signed   By: Marijo Conception, M.D.   On: 06/11/2018 09:17    EKG:   Orders placed or performed during the hospital encounter of 06/09/18  . EKG 12-Lead  . EKG 12-Lead      Management plans discussed with the patient, family and they are in agreement.  CODE STATUS:     Code Status Orders  (From admission, onward)         Start     Ordered   06/09/18 2101  Full code  Continuous     06/09/18 2100        Code Status  History    This patient has a current code status but no historical code status.      TOTAL TIME TAKING CARE OF THIS PATIENT: 43  minutes.   Note: This dictation was prepared with Dragon dictation along with smaller phrase technology. Any transcriptional errors that result from this process are unintentional.   @M[facility] @  on 06/11/2018 at 12:19 PM  Between 7am to 6pm - Pager - [phone]  After 6pm go to [email or web address] - password [email or web address] [facility]  Office  [phone]  CC: Primary care physician; Lucille Passy, MD

## 2018-06-11 NOTE — Care Management (Signed)
RNCM was not able to see patient prior to discharge. Per MD and primary RN patient did not need home health and was not interested. PT did recommend HHPT. Patient was to be transferred to [facility] but instead was medically cleared before [facility] had a bed become available. Patient was reported to be able to ambulate independently. I have left a voicemail with son Rodman Key [phone] regarding availability of home health and potenially obtaining from primary MD down the road if it becomes needed. Awaiting callback.

## 2018-06-11 NOTE — Progress Notes (Signed)
[name] to be D/C'd Home per MD order. Patient given discharge teaching and paperwork regarding medications, diet, follow-up appointments and activity. Patient understanding verbalized. No questions or complaints at this time. Skin condition as charted. IV and telemetry removed prior to leaving.  No further needs by Care Management/Social Work. Prescriptions given to patient.  An After Visit Summary was printed and given to the patient.   Patient escorted via wheelchair by volunteer.  Terrilyn Saver

## 2018-06-12 ENCOUNTER — Telehealth: Payer: Self-pay | Admitting: Behavioral Health

## 2018-06-12 LAB — CA 125: CANCER ANTIGEN (CA) 125: 769 U/mL — AB (ref 0.0–38.1)

## 2018-06-12 NOTE — Telephone Encounter (Signed)
Called & spoke with patient briefly regarding last hospital admission. She voiced "feeling a lot better & that she was available to see Dr. Deborra Medina any day next week besides Monday". The call was place on hold to schedule appointment, but when RN came back to the line the call had been released or disconnected. Attempted to reach patient again, but no answer; left v/m stating that her next appointment was scheduled for 06/18/18 at 11:00 AM.

## 2018-06-14 LAB — CULTURE, BLOOD (ROUTINE X 2)
CULTURE: NO GROWTH
Culture: NO GROWTH
SPECIAL REQUESTS: ADEQUATE
Special Requests: ADEQUATE

## 2018-06-18 ENCOUNTER — Ambulatory Visit: Payer: Medicare Other | Admitting: Family Medicine

## 2018-06-18 ENCOUNTER — Encounter: Payer: Self-pay | Admitting: Family Medicine

## 2018-06-18 VITALS — BP 110/68 | HR 88 | Temp 98.7°F | Ht 66.0 in | Wt 237.8 lb

## 2018-06-18 DIAGNOSIS — J189 Pneumonia, unspecified organism: Secondary | ICD-10-CM | POA: Diagnosis not present

## 2018-06-18 DIAGNOSIS — J810 Acute pulmonary edema: Secondary | ICD-10-CM

## 2018-06-18 DIAGNOSIS — J9601 Acute respiratory failure with hypoxia: Secondary | ICD-10-CM

## 2018-06-18 DIAGNOSIS — D61811 Other drug-induced pancytopenia: Secondary | ICD-10-CM

## 2018-06-18 DIAGNOSIS — C569 Malignant neoplasm of unspecified ovary: Secondary | ICD-10-CM

## 2018-06-18 LAB — CBC WITH DIFFERENTIAL/PLATELET
BASOS ABS: 0 10*3/uL (ref 0.0–0.1)
Basophils Relative: 0.3 % (ref 0.0–3.0)
EOS ABS: 0.1 10*3/uL (ref 0.0–0.7)
Eosinophils Relative: 0.9 % (ref 0.0–5.0)
HCT: 25.2 % — ABNORMAL LOW (ref 36.0–46.0)
Hemoglobin: 8.4 g/dL — ABNORMAL LOW (ref 12.0–15.0)
LYMPHS ABS: 0.4 10*3/uL — AB (ref 0.7–4.0)
Lymphocytes Relative: 6.3 % — ABNORMAL LOW (ref 12.0–46.0)
MCHC: 33.2 g/dL (ref 30.0–36.0)
MCV: 89.6 fl (ref 78.0–100.0)
MONO ABS: 0.4 10*3/uL (ref 0.1–1.0)
MONOS PCT: 5.9 % (ref 3.0–12.0)
Neutro Abs: 6.1 10*3/uL (ref 1.4–7.7)
PLATELETS: 268 10*3/uL (ref 150.0–400.0)
RBC: 2.82 Mil/uL — AB (ref 3.87–5.11)
RDW: 16 % — ABNORMAL HIGH (ref 11.5–15.5)
WBC: 7 10*3/uL (ref 4.0–10.5)

## 2018-06-18 NOTE — Progress Notes (Signed)
Subjective:   Patient ID: [name], female    DOB: 1950-10-17, 67 y.o.   MRN: [id number]  [name] is a pleasant 67 y.o. year old female who presents to clinic today with Hospitalization Follow-up (Patient is here today for a hospital F/U. She was admitted on 9.24.19-9.26.19.  Dx were acute respiratory failure, HCAP, Acute Pulmonary Edema, Symptomatic Anemia.  She had a blood transfusion as HGB was 4.3.  Gave Empiric IV abx Cefepine and Vanca.  Advised to take Cefdinir 332m 1bid x 10d.  They D/C'ed Lisinopril, Metformin & Ranitidine.  She would like to have a CBC drawn to ensure that her HGB is still ok since her body no longer makes blood.)  on 06/18/2018  HPI:  [facility].  Acute respiratory failure due to HCAP/pulmonary edema. Clinically improved quickly. [place] Also found to have Hgb 4.3 and was transfused 2U PRBCs.  Lab Results  Component Value Date   WBC 8.5 06/11/2018   HGB 8.8 (L) 06/11/2018   HCT 24.2 (L) 06/11/2018   MCV 89.0 06/11/2018   PLT 265 06/11/2018    Saw her oncologist, Dr. BFransisca Connorson 06/12/18- note reviewed. She was told that her prognosis is poor but she is scheduled for chemotherapy next Friday. She has follow up with her oncologist after that.  She has not decided if she wants to continue treatment.  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 She understands that it has a worse prognosis; she previously hoped that she had 5-10 more years to live. While we discussed that every patient's course is different, she understands that she likely has less time than this. However, she did not want to talk about time in discrete numbers/statistics.  -The patient's most  important goals of care include: She wants to pursue treatment as long as there is any possibility that it will prolong duration of life. Quality of life is also important, but she is willing to sacrifice some QOL to pursue further treatment.   -Discontinuing treatment was discussed in the following context: As above; it was discussed that at this time, considering her PR disease and her multiple comorbidities, it is always reasonable if she would like to stop treatment. She does not wish to do so at this time, although she plans on discussing further with her children.    Current Outpatient Medications on File Prior to Visit  Medication Sig Dispense Refill  . acetaminophen (TYLENOL) 325 MG tablet Take 2 tablets (650 mg total) by mouth every 6 (six) hours as needed for mild pain (or Fever >/= 101).    .Marland Kitchenapixaban (ELIQUIS) 5 MG TABS tablet Take 5 mg by mouth 2 (two) times daily.    .Marland Kitchenatorvastatin (LIPITOR) 40 MG tablet Take 40 mg by mouth daily.    . Blood Glucose Monitoring Suppl (ONE TOUCH ULTRA 2) w/Device KIT Check blood sugar three times a day and as directed. Dx E11.319 1 each 0  . busPIRone (BUSPAR) 10 MG tablet TAKE 1 TABLET BY MOUTH AT BEDTIME 90 tablet 2  . ferrous sulfate 325 (65 FE) MG EC tablet Take 1 tablet (325 mg total) by mouth 2 (two) times daily. 60 tablet 3  . FLUoxetine (PROZAC) 20 MG capsule TAKE 3 CAPSULES  BY MOUTH ONCE DAILY 270 capsule 2  . furosemide (LASIX) 20 MG tablet Take 1 tablet (20 mg total) by mouth every morning. 90 tablet 3  . glipiZIDE (GLUCOTROL) 5 MG tablet TAKE 1 TABLET BY MOUTH TWICE DAILY BEFORE MEAL(S) 60 tablet 0  . glucose blood (ONE TOUCH ULTRA TEST) test strip USE ONE STRIP TO CHECK GLUCOSE THREE TIMES DAILY AND  AS  DIRECTED 100 each 2  . LANTUS 100 UNIT/ML injection INJECT 30 UNITS SUBCUTANEOUSLY IN THE EVENING 1 vial 1  . ondansetron (ZOFRAN) 8 MG tablet Take 1 tablet (8 mg total) by mouth every 8 (eight) hours as needed for nausea or vomiting. Will  not make drowsy. 30 tablet 1  . ONETOUCH DELICA LANCETS FINE MISC USE TO TEST BLOOD SUGAR THREE TIMES DAILY AND AS DIRECTED 100 each 3  . pantoprazole (PROTONIX) 40 MG tablet Take 1 tablet by mouth 2 (two) times daily.  0  . tamsulosin (FLOMAX) 0.4 MG CAPS capsule Take 1 capsule by mouth daily.    . valACYclovir (VALTREX) 1000 MG tablet Take 1,000 mg by mouth daily.  5  . amiodarone (PACERONE) 200 MG tablet Take 200 mg by mouth daily.    Marland Kitchen diltiazem (CARDIZEM CD) 240 MG 24 hr capsule Take 240 mg by mouth daily.     No current facility-administered medications on file prior to visit.     Allergies  Allergen Reactions  . Aspirin Other (See Comments)    asthma  . Lorazepam Shortness Of Breath  . Codeine Nausea And Vomiting  . Other Dermatitis    Tape adhesive and surgical glue     Past Medical History:  Diagnosis Date  . Acute upper respiratory infections of unspecified site   . Anemia, unspecified   . Anxiety state, unspecified   . Cellulitis   . Chronic diastolic heart failure ([place])   . Congestive heart failure, unspecified   . Contact dermatitis and other eczema, due to unspecified cause   . Depressive disorder, not elsewhere classified   . Diabetes mellitus   . Essential hypertension, benign   . Mastoiditis   . Other and unspecified hyperlipidemia   . Other diseases of lung, not elsewhere classified   . Other dyspnea and respiratory abnormality   . Recent retinal detachment, partial, with giant tear   . Type II or unspecified type diabetes mellitus with peripheral circulatory disorders, not stated as uncontrolled(250.70)   . Unspecified asthma, with exacerbation   . Unspecified urinary incontinence   . Wheezing     Past Surgical History:  Procedure Laterality Date  . ABDOMINAL HYSTERECTOMY    . DILATION AND CURETTAGE OF UTERUS    . EYE SURGERY     Left retina repair- 11/09, 02-20-09  . KNEE SURGERY  2003   meniscus  . LAPAROSCOPIC GASTRIC BANDING  08/21/10     Family History  Problem Relation Age of Onset  . Cancer Mother        breast  . Cancer Sister        breast  . Cancer Maternal Aunt        breast  . Cancer Maternal Uncle        breast  . Cancer Maternal Grandmother        breast  . Cancer Maternal Uncle     Social History   Socioeconomic History  . Marital status: Married    Spouse name: Not on file  . Number of children: Not on file  .  Years of education: Not on file  . Highest education level: Not on file  Occupational History  . Occupation: School bus Education administrator: RETIRED  Social Needs  . Financial resource strain: Not on file  . Food insecurity:    Worry: Not on file    Inability: Not on file  . Transportation needs:    Medical: Not on file    Non-medical: Not on file  Tobacco Use  . Smoking status: Never Smoker  . Smokeless tobacco: Never Used  Substance and Sexual Activity  . Alcohol use: No    Alcohol/week: 0.0 standard drinks  . Drug use: No  . Sexual activity: Never  Lifestyle  . Physical activity:    Days per week: Not on file    Minutes per session: Not on file  . Stress: Not on file  Relationships  . Social connections:    Talks on phone: Not on file    Gets together: Not on file    Attends religious service: Not on file    Active member of club or organization: Not on file    Attends meetings of clubs or organizations: Not on file    Relationship status: Not on file  . Intimate partner violence:    Fear of current or ex partner: Not on file    Emotionally abused: Not on file    Physically abused: Not on file    Forced sexual activity: Not on file  Other Topics Concern  . Not on file  Social History Narrative  . Not on file   The PMH, PSH, Social History, Family History, Medications, and allergies have been reviewed in [facility], and have been updated if relevant.   Review of Systems  Constitutional: Positive for fatigue.  Respiratory: Negative.   Cardiovascular: Negative.    Gastrointestinal: Negative.   Musculoskeletal: Negative.   Neurological: Negative.   Hematological: Negative.   Psychiatric/Behavioral: Negative.   All other systems reviewed and are negative.      Objective:    BP 110/68 (BP Location: Left Arm, Patient Position: Sitting, Cuff Size: Normal)   Pulse 88   Temp 98.7 F (37.1 C) (Oral)   Ht 5' 6"  (1.676 m)   Wt 237 lb 12.8 oz (107.9 kg)   SpO2 97%   BMI 38.38 kg/m    Physical Exam  Constitutional: She appears well-developed and well-nourished. No distress.  HENT:  Head: Normocephalic and atraumatic.  Eyes: EOM are normal.  Neck: Normal range of motion.  Cardiovascular: Normal rate.  Pulmonary/Chest: Effort normal and breath sounds normal.  Musculoskeletal: Normal range of motion.  Skin: Skin is warm and dry. She is not diaphoretic.  Psychiatric: She has a normal mood and affect. Her behavior is normal. Judgment and thought content normal.  Nursing note and vitals reviewed.         Assessment & Plan:   HCAP (healthcare-associated pneumonia) - Plan: CBC with Differential/Platelet  Acute respiratory failure with hypoxia (HCC)  Acute pulmonary edema (HCC)  Malignant neoplasm of ovary, unspecified laterality ([place])  Other drug-induced pancytopenia ([place]) - Plan: CBC with Differential/Platelet No follow-ups on file.

## 2018-06-18 NOTE — Assessment & Plan Note (Signed)
>  25 minutes spent in face to face time with patient, >50% spent in counselling or coordination of care discussing her prognosis, recent hospitalization and anemia.  She wanted my opinion about her life expectancy since oncology told her a year or less.  I told her that I cannot tell her how much time she has left and she should talk with her family about her next steps.  At this point, she says she "doesn't feel like I'm dying" and wants to continue treatment.  It is ultimately her decision and I also discussed palliative treatments since she is aware that her cancer is incurable.  Palliative treatments would include blood transfusions like she received in the hospital to help with fatigue.  She asks that I check a stat CBC today.  CBC ordered and offered my continued support. The patient indicates understanding of these issues and agrees with the plan. Orders Placed This Encounter  Procedures  . CBC with Differential/Platelet

## 2018-06-21 ENCOUNTER — Observation Stay
Admission: EM | Admit: 2018-06-21 | Discharge: 2018-06-22 | Disposition: A | Discharge status: Home / Self Care | Payer: Medicare Other | Attending: Internal Medicine | Admitting: Internal Medicine

## 2018-06-21 ENCOUNTER — Emergency Department: Payer: Medicare Other

## 2018-06-21 ENCOUNTER — Other Ambulatory Visit: Payer: Self-pay

## 2018-06-21 DIAGNOSIS — Z79899 Other long term (current) drug therapy: Secondary | ICD-10-CM | POA: Insufficient documentation

## 2018-06-21 DIAGNOSIS — E11649 Type 2 diabetes mellitus with hypoglycemia without coma: Secondary | ICD-10-CM | POA: Diagnosis not present

## 2018-06-21 DIAGNOSIS — Z809 Family history of malignant neoplasm, unspecified: Secondary | ICD-10-CM | POA: Diagnosis not present

## 2018-06-21 DIAGNOSIS — E160 Drug-induced hypoglycemia without coma: Secondary | ICD-10-CM | POA: Diagnosis present

## 2018-06-21 DIAGNOSIS — Z803 Family history of malignant neoplasm of breast: Secondary | ICD-10-CM | POA: Diagnosis not present

## 2018-06-21 DIAGNOSIS — N183 Chronic kidney disease, stage 3 (moderate): Secondary | ICD-10-CM | POA: Diagnosis not present

## 2018-06-21 DIAGNOSIS — Z886 Allergy status to analgesic agent status: Secondary | ICD-10-CM | POA: Insufficient documentation

## 2018-06-21 DIAGNOSIS — R32 Unspecified urinary incontinence: Secondary | ICD-10-CM | POA: Insufficient documentation

## 2018-06-21 DIAGNOSIS — I5032 Chronic diastolic (congestive) heart failure: Secondary | ICD-10-CM | POA: Diagnosis not present

## 2018-06-21 DIAGNOSIS — Z888 Allergy status to other drugs, medicaments and biological substances status: Secondary | ICD-10-CM | POA: Insufficient documentation

## 2018-06-21 DIAGNOSIS — Z7901 Long term (current) use of anticoagulants: Secondary | ICD-10-CM | POA: Insufficient documentation

## 2018-06-21 DIAGNOSIS — J90 Pleural effusion, not elsewhere classified: Secondary | ICD-10-CM | POA: Insufficient documentation

## 2018-06-21 DIAGNOSIS — Z90710 Acquired absence of both cervix and uterus: Secondary | ICD-10-CM | POA: Insufficient documentation

## 2018-06-21 DIAGNOSIS — I130 Hypertensive heart and chronic kidney disease with heart failure and stage 1 through stage 4 chronic kidney disease, or unspecified chronic kidney disease: Secondary | ICD-10-CM | POA: Diagnosis not present

## 2018-06-21 DIAGNOSIS — C569 Malignant neoplasm of unspecified ovary: Secondary | ICD-10-CM | POA: Diagnosis not present

## 2018-06-21 DIAGNOSIS — I480 Paroxysmal atrial fibrillation: Secondary | ICD-10-CM | POA: Insufficient documentation

## 2018-06-21 DIAGNOSIS — Z885 Allergy status to narcotic agent status: Secondary | ICD-10-CM | POA: Diagnosis not present

## 2018-06-21 DIAGNOSIS — F329 Major depressive disorder, single episode, unspecified: Secondary | ICD-10-CM | POA: Diagnosis not present

## 2018-06-21 DIAGNOSIS — Z9884 Bariatric surgery status: Secondary | ICD-10-CM | POA: Insufficient documentation

## 2018-06-21 DIAGNOSIS — E785 Hyperlipidemia, unspecified: Secondary | ICD-10-CM | POA: Diagnosis not present

## 2018-06-21 DIAGNOSIS — D649 Anemia, unspecified: Secondary | ICD-10-CM

## 2018-06-21 DIAGNOSIS — E1151 Type 2 diabetes mellitus with diabetic peripheral angiopathy without gangrene: Secondary | ICD-10-CM | POA: Diagnosis not present

## 2018-06-21 DIAGNOSIS — Z794 Long term (current) use of insulin: Secondary | ICD-10-CM | POA: Insufficient documentation

## 2018-06-21 DIAGNOSIS — D631 Anemia in chronic kidney disease: Secondary | ICD-10-CM | POA: Insufficient documentation

## 2018-06-21 DIAGNOSIS — Z91048 Other nonmedicinal substance allergy status: Secondary | ICD-10-CM | POA: Diagnosis not present

## 2018-06-21 DIAGNOSIS — I447 Left bundle-branch block, unspecified: Secondary | ICD-10-CM | POA: Insufficient documentation

## 2018-06-21 DIAGNOSIS — Z6839 Body mass index (BMI) 39.0-39.9, adult: Secondary | ICD-10-CM | POA: Insufficient documentation

## 2018-06-21 DIAGNOSIS — E162 Hypoglycemia, unspecified: Secondary | ICD-10-CM | POA: Diagnosis present

## 2018-06-21 DIAGNOSIS — T383X1A Poisoning by insulin and oral hypoglycemic [antidiabetic] drugs, accidental (unintentional), initial encounter: Secondary | ICD-10-CM | POA: Diagnosis present

## 2018-06-21 DIAGNOSIS — F419 Anxiety disorder, unspecified: Secondary | ICD-10-CM | POA: Diagnosis not present

## 2018-06-21 DIAGNOSIS — I4892 Unspecified atrial flutter: Secondary | ICD-10-CM | POA: Insufficient documentation

## 2018-06-21 DIAGNOSIS — C786 Secondary malignant neoplasm of retroperitoneum and peritoneum: Secondary | ICD-10-CM | POA: Diagnosis not present

## 2018-06-21 DIAGNOSIS — E1122 Type 2 diabetes mellitus with diabetic chronic kidney disease: Secondary | ICD-10-CM | POA: Insufficient documentation

## 2018-06-21 DIAGNOSIS — J449 Chronic obstructive pulmonary disease, unspecified: Secondary | ICD-10-CM | POA: Insufficient documentation

## 2018-06-21 LAB — GLUCOSE, CAPILLARY
GLUCOSE-CAPILLARY: 58 mg/dL — AB (ref 70–99)
GLUCOSE-CAPILLARY: 97 mg/dL (ref 70–99)
GLUCOSE-CAPILLARY: 132 mg/dL — AB (ref 70–99)
Glucose-Capillary: 59 mg/dL — ABNORMAL LOW (ref 70–99)
Glucose-Capillary: 79 mg/dL (ref 70–99)
Glucose-Capillary: 67 mg/dL — ABNORMAL LOW (ref 70–99)
Glucose-Capillary: 94 mg/dL (ref 70–99)
Glucose-Capillary: 85 mg/dL (ref 70–99)
Glucose-Capillary: 142 mg/dL — ABNORMAL HIGH (ref 70–99)
Glucose-Capillary: 137 mg/dL — ABNORMAL HIGH (ref 70–99)

## 2018-06-21 LAB — TROPONIN I

## 2018-06-21 LAB — CBC WITH DIFFERENTIAL/PLATELET
Basophils Absolute: 0 10*3/uL (ref 0–0.1)
Basophils Relative: 0 %
EOS ABS: 0.1 10*3/uL (ref 0–0.7)
Eosinophils Relative: 1 %
HEMATOCRIT: 23.5 % — AB (ref 35.0–47.0)
HEMOGLOBIN: 7.7 g/dL — AB (ref 12.0–16.0)
Lymphocytes Relative: 5 %
Lymphs Abs: 0.3 10*3/uL — ABNORMAL LOW (ref 1.0–3.6)
MCH: 29.4 pg (ref 26.0–34.0)
MCHC: 32.8 g/dL (ref 32.0–36.0)
MCV: 89.7 fL (ref 80.0–100.0)
MONOS PCT: 6 %
Monocytes Absolute: 0.4 10*3/uL (ref 0.2–0.9)
NEUTROS ABS: 5.8 10*3/uL (ref 1.4–6.5)
NEUTROS PCT: 88 %
Platelets: 265 10*3/uL (ref 150–440)
RBC: 2.62 MIL/uL — AB (ref 3.80–5.20)
RDW: 16 % — ABNORMAL HIGH (ref 11.5–14.5)
WBC: 6.5 10*3/uL (ref 3.6–11.0)

## 2018-06-21 LAB — BASIC METABOLIC PANEL
Anion gap: 12 (ref 5–15)
BUN: 30 mg/dL — ABNORMAL HIGH (ref 8–23)
CHLORIDE: 101 mmol/L (ref 98–111)
CO2: 21 mmol/L — AB (ref 22–32)
Calcium: 7.8 mg/dL — ABNORMAL LOW (ref 8.9–10.3)
Creatinine, Ser: 1.84 mg/dL — ABNORMAL HIGH (ref 0.44–1.00)
GFR calc non Af Amer: 27 mL/min — ABNORMAL LOW (ref >60)
GFR, EST AFRICAN AMERICAN: 31 mL/min — AB (ref >60)
Glucose, Bld: 60 mg/dL — ABNORMAL LOW (ref 70–99)
POTASSIUM: 4.1 mmol/L (ref 3.5–5.1)
SODIUM: 134 mmol/L — AB (ref 135–145)

## 2018-06-21 LAB — TYPE AND SCREEN
ABO/RH(D): A POS
ANTIBODY SCREEN: NEGATIVE

## 2018-06-21 LAB — BRAIN NATRIURETIC PEPTIDE: B NATRIURETIC PEPTIDE 5: 144 pg/mL — AB (ref 0.0–100.0)

## 2018-06-21 MED ORDER — ALBUTEROL SULFATE (2.5 MG/3ML) 0.083% IN NEBU: 2.5000 mg | INHALATION_SOLUTION | RESPIRATORY_TRACT | Status: DC | PRN

## 2018-06-21 MED ORDER — BUSPIRONE HCL 10 MG PO TABS
10.0000 mg | ORAL_TABLET | Freq: Every day | ORAL | Status: DC
  Administered 2018-06-21: 22:00:00 10 mg via ORAL
  Filled 2018-06-21 (×2): qty 1

## 2018-06-21 MED ORDER — RAMELTEON 8 MG PO TABS
8.0000 mg | ORAL_TABLET | Freq: Every day | ORAL | Status: DC
  Administered 2018-06-22: 8 mg via ORAL
  Filled 2018-06-21 (×3): qty 1

## 2018-06-21 MED ORDER — SODIUM CHLORIDE 0.9% FLUSH: 3.0000 mL | INTRAVENOUS | Status: DC | PRN

## 2018-06-21 MED ORDER — ADULT MULTIVITAMIN W/MINERALS CH
1.0000 | ORAL_TABLET | Freq: Every day | ORAL | Status: DC
  Administered 2018-06-22: 09:00:00 1 via ORAL
  Filled 2018-06-21: qty 1

## 2018-06-21 MED ORDER — FERROUS SULFATE 325 (65 FE) MG PO TABS
325.0000 mg | ORAL_TABLET | Freq: Two times a day (BID) | ORAL | Status: DC
  Administered 2018-06-21 – 2018-06-22 (×3): 325 mg via ORAL
  Filled 2018-06-21 (×3): qty 1

## 2018-06-21 MED ORDER — PREMIER PROTEIN SHAKE
11.0000 [oz_av] | Freq: Two times a day (BID) | ORAL | Status: DC
  Administered 2018-06-21: 14:00:00 11 [oz_av] via ORAL

## 2018-06-21 MED ORDER — FLUOXETINE HCL 20 MG PO CAPS
60.0000 mg | ORAL_CAPSULE | Freq: Every day | ORAL | Status: DC
  Administered 2018-06-21 – 2018-06-22 (×2): 60 mg via ORAL
  Filled 2018-06-21 (×2): qty 3

## 2018-06-21 MED ORDER — POLYETHYLENE GLYCOL 3350 17 G PO PACK: 17.0000 g | PACK | Freq: Every day | ORAL | Status: DC | PRN

## 2018-06-21 MED ORDER — SODIUM CHLORIDE 0.9% FLUSH
3.0000 mL | Freq: Two times a day (BID) | INTRAVENOUS | Status: DC
  Administered 2018-06-21 – 2018-06-22 (×3): 3 mL via INTRAVENOUS

## 2018-06-21 MED ORDER — FUROSEMIDE 20 MG PO TABS
20.0000 mg | ORAL_TABLET | Freq: Every morning | ORAL | Status: DC
  Administered 2018-06-21 – 2018-06-22 (×2): 20 mg via ORAL
  Filled 2018-06-21 (×2): qty 1

## 2018-06-21 MED ORDER — ONDANSETRON HCL 4 MG/2ML IJ SOLN
4.0000 mg | Freq: Four times a day (QID) | INTRAMUSCULAR | Status: DC | PRN
  Administered 2018-06-21: 13:00:00 4 mg via INTRAVENOUS
  Filled 2018-06-21: qty 2

## 2018-06-21 MED ORDER — ACETAMINOPHEN 325 MG PO TABS: 650.0000 mg | ORAL_TABLET | Freq: Four times a day (QID) | ORAL | Status: DC | PRN

## 2018-06-21 MED ORDER — PANTOPRAZOLE SODIUM 40 MG PO TBEC
40.0000 mg | DELAYED_RELEASE_TABLET | Freq: Two times a day (BID) | ORAL | Status: DC
  Administered 2018-06-21 – 2018-06-22 (×3): 40 mg via ORAL
  Filled 2018-06-21 (×3): qty 1

## 2018-06-21 MED ORDER — APIXABAN 5 MG PO TABS
5.0000 mg | ORAL_TABLET | Freq: Two times a day (BID) | ORAL | Status: DC
  Administered 2018-06-21 – 2018-06-22 (×3): 5 mg via ORAL
  Filled 2018-06-21 (×3): qty 1

## 2018-06-21 MED ORDER — DILTIAZEM HCL ER COATED BEADS 240 MG PO CP24
240.0000 mg | ORAL_CAPSULE | Freq: Every day | ORAL | Status: DC
  Administered 2018-06-21 – 2018-06-22 (×2): 240 mg via ORAL
  Filled 2018-06-21 (×2): qty 1

## 2018-06-21 MED ORDER — ONDANSETRON HCL 4 MG PO TABS: 4.0000 mg | ORAL_TABLET | Freq: Four times a day (QID) | ORAL | Status: DC | PRN

## 2018-06-21 MED ORDER — TAMSULOSIN HCL 0.4 MG PO CAPS
0.4000 mg | ORAL_CAPSULE | Freq: Every day | ORAL | Status: DC
  Administered 2018-06-21 – 2018-06-22 (×2): 0.4 mg via ORAL
  Filled 2018-06-21 (×2): qty 1

## 2018-06-21 MED ORDER — ACETAMINOPHEN 650 MG RE SUPP: 650.0000 mg | Freq: Four times a day (QID) | RECTAL | Status: DC | PRN

## 2018-06-21 MED ORDER — ATORVASTATIN CALCIUM 20 MG PO TABS
40.0000 mg | ORAL_TABLET | Freq: Every day | ORAL | Status: DC
  Administered 2018-06-21 – 2018-06-22 (×2): 40 mg via ORAL
  Filled 2018-06-21 (×2): qty 2

## 2018-06-21 MED ORDER — AMIODARONE HCL 200 MG PO TABS
200.0000 mg | ORAL_TABLET | Freq: Every day | ORAL | Status: DC
  Administered 2018-06-21 – 2018-06-22 (×2): 200 mg via ORAL
  Filled 2018-06-21 (×2): qty 1

## 2018-06-21 NOTE — ED Triage Notes (Signed)
Pt arrived via [facility] from home with c/o SHOB. EMS states that pt had a blood transfusion two weeks ago due to the similar symptoms. 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 Pt AxOx4. Pt took 3 glucose tablets due to blood sugar being low. EMS BS 94.

## 2018-06-21 NOTE — ED Notes (Signed)
Pulse Oximetry while ambulating remained 99-100%

## 2018-06-21 NOTE — H&P (Signed)
[place] [facility] [place] NAME: [name]    MR#:  [id number]  DATE OF BIRTH:  03-Mar-1951  DATE OF ADMISSION:  06/21/2018  PRIMARY CARE PHYSICIAN: Lucille Passy, MD   REQUESTING/REFERRING PHYSICIAN: Dr. Jacqualine Code  CHIEF COMPLAINT:   Chief Complaint  Patient presents with  . Respiratory Distress    HISTORY OF PRESENT ILLNESS:  [name]  is a 67 y.o. female with a known history of insulin-dependent diabetes mellitus, ovarian cancer, hypertension, CKD stage III, morbid obesity, asthma, diastolic CHF presents to the emergency room complaining of worsening shortness of breath.  Her shortness of breath has been worsening over the past 5 months.  This has been deemed to be multifactorial because of asthma, COPD, ovarian cancer, morbid obesity.  Patient has been found to be hypoglycemic and was given glucose, orange juice and continues to be hypoglycemic and is being admitted for observation.  Patient has not taken her insulin in over 36 hours.  She is normally supposed to take 30 units of Levemir at night.  She took 15 units 2 days back.  Continues to take metformin and glimepiride.  She does have on and off vomiting due to her ovarian cancer which is chronic.  Poor appetite.  Lost 10 pounds in 6 days according to the patient.   PAST MEDICAL HISTORY:   Past Medical History:  Diagnosis Date  . Acute upper respiratory infections of unspecified site   . Anemia, unspecified   . Anxiety state, unspecified   . Cellulitis   . Chronic diastolic heart failure ([place])   . Congestive heart failure, unspecified   . Contact dermatitis and other eczema, due to unspecified cause   . Depressive disorder, not elsewhere classified   . Diabetes mellitus   . Essential hypertension, benign   . Mastoiditis   . Other and unspecified hyperlipidemia   . Other diseases of lung, not elsewhere classified   . Other dyspnea and respiratory abnormality   . Recent retinal  detachment, partial, with giant tear   . Type II or unspecified type diabetes mellitus with peripheral circulatory disorders, not stated as uncontrolled(250.70)   . Unspecified asthma, with exacerbation   . Unspecified urinary incontinence   . Wheezing     PAST SURGICAL HISTORY:   Past Surgical History:  Procedure Laterality Date  . ABDOMINAL HYSTERECTOMY    . DILATION AND CURETTAGE OF UTERUS    . EYE SURGERY     Left retina repair- 11/09, 02-20-09  . KNEE SURGERY  2003   meniscus  . LAPAROSCOPIC GASTRIC BANDING  08/21/10    SOCIAL HISTORY:   Social History   Tobacco Use  . Smoking status: Never Smoker  . Smokeless tobacco: Never Used  Substance Use Topics  . Alcohol use: No    Alcohol/week: 0.0 standard drinks    FAMILY HISTORY:   Family History  Problem Relation Age of Onset  . Cancer Mother        breast  . Cancer Sister        breast  . Cancer Maternal Aunt        breast  . Cancer Maternal Uncle        breast  . Cancer Maternal Grandmother        breast  . Cancer Maternal Uncle     DRUG ALLERGIES:   Allergies  Allergen Reactions  . Aspirin Other (See Comments)    asthma  . Lorazepam Shortness Of Breath  .  Codeine Nausea And Vomiting  . Other Dermatitis    Tape adhesive and surgical glue     REVIEW OF SYSTEMS:   Review of Systems  Constitutional: Positive for malaise/fatigue. Negative for chills, fever and weight loss.  HENT: Negative for hearing loss and nosebleeds.   Eyes: Negative for blurred vision, double vision and pain.  Respiratory: Negative for cough, hemoptysis, sputum production, shortness of breath and wheezing.   Cardiovascular: Negative for chest pain, palpitations, orthopnea and leg swelling.  Gastrointestinal: Positive for abdominal pain, nausea and vomiting. Negative for constipation and diarrhea.  Genitourinary: Negative for dysuria and hematuria.  Musculoskeletal: Negative for back pain, falls and myalgias.  Skin: Negative for  rash.  Neurological: Negative for dizziness, tremors, sensory change, speech change, focal weakness, seizures and headaches.  Endo/Heme/Allergies: Does not bruise/bleed easily.  Psychiatric/Behavioral: Negative for depression and memory loss. The patient is not nervous/anxious.    MEDICATIONS AT HOME:   Prior to Admission medications   Medication Sig Start Date End Date Taking? Authorizing Provider  acetaminophen (TYLENOL) 325 MG tablet Take 2 tablets (650 mg total) by mouth every 6 (six) hours as needed for mild pain (or Fever >/= 101). 06/11/18   Gouru, Aruna, MD  amiodarone (PACERONE) 200 MG tablet Take 200 mg by mouth daily. 04/12/17 06/09/18  [provider]  apixaban (ELIQUIS) 5 MG TABS tablet Take 5 mg by mouth 2 (two) times daily.    [provider]  atorvastatin (LIPITOR) 40 MG tablet Take 40 mg by mouth daily.    [provider]  Blood Glucose Monitoring Suppl (ONE TOUCH ULTRA 2) w/Device KIT Check blood sugar three times a day and as directed. Dx U27.253 07/18/16   Lucille Passy, MD  busPIRone (BUSPAR) 10 MG tablet TAKE 1 TABLET BY MOUTH AT BEDTIME 09/03/17   Lucille Passy, MD  cephALEXin (KEFLEX) 500 MG capsule Take 500 mg by mouth 2 (two) times daily. 06/19/18 06/23/18  [provider]  diltiazem (CARDIZEM CD) 240 MG 24 hr capsule Take 240 mg by mouth daily. 04/12/17 06/09/18  [provider]  ferrous sulfate 325 (65 FE) MG EC tablet Take 1 tablet (325 mg total) by mouth 2 (two) times daily. 06/11/18 06/11/19  Nicholes Mango, MD  FLUoxetine (PROZAC) 20 MG capsule TAKE 3 CAPSULES BY MOUTH ONCE DAILY 04/07/18   Lucille Passy, MD  furosemide (LASIX) 20 MG tablet Take 1 tablet (20 mg total) by mouth every morning. 12/18/17   Lucille Passy, MD  glipiZIDE (GLUCOTROL) 5 MG tablet TAKE 1 TABLET BY MOUTH TWICE DAILY BEFORE MEAL(S) 05/27/18   Lucille Passy, MD  glucose blood (ONE TOUCH ULTRA TEST) test strip USE ONE STRIP TO CHECK GLUCOSE THREE TIMES DAILY AND  AS   DIRECTED 03/09/18   Lucille Passy, MD  LANTUS 100 UNIT/ML injection INJECT 30 UNITS SUBCUTANEOUSLY IN THE EVENING 05/01/18   Lucille Passy, MD  ondansetron (ZOFRAN) 8 MG tablet Take 1 tablet (8 mg total) by mouth every 8 (eight) hours as needed for nausea or vomiting. Will not make drowsy. 04/07/15   Gordy Levan, MD  ONETOUCH DELICA LANCETS FINE MISC USE TO TEST BLOOD SUGAR THREE TIMES DAILY AND AS DIRECTED 12/05/17   Lucille Passy, MD  pantoprazole (PROTONIX) 40 MG tablet Take 1 tablet by mouth 2 (two) times daily. 05/19/18   [provider]  tamsulosin (FLOMAX) 0.4 MG CAPS capsule Take 1 capsule by mouth daily. 04/14/18   [provider]  valACYclovir (VALTREX) 1000 MG tablet Take 1,000 mg by mouth daily. 05/22/18   [provider]     VITAL SIGNS:  Blood pressure 129/64, pulse 95, temperature 98 F (36.7 C), temperature source Oral, resp. rate (!) 23, height 5' 5"  (1.651 m), weight 107.5 kg, SpO2 100 %.  PHYSICAL EXAMINATION:  Physical Exam  GENERAL:  67 y.o.-year-old patient lying in the bed .  Morbidly obese EYES: Pupils equal, round, reactive to light and accommodation. No scleral icterus. Extraocular muscles intact.  HEENT: Head atraumatic, normocephalic. Oropharynx and nasopharynx clear. No oropharyngeal erythema, moist oral mucosa  NECK:  Supple, no jugular venous distention. No thyroid enlargement, no tenderness.  LUNGS: Normal breath sounds bilaterally, no wheezing, rales, rhonchi. No use of accessory muscles of respiration.  CARDIOVASCULAR: S1, S2 normal. No murmurs, rubs, or gallops.  ABDOMEN: Soft, nontender, nondistended. Bowel sounds present. No organomegaly or mass.  EXTREMITIES: No pedal edema, cyanosis, or clubbing. + 2 pedal & radial pulses b/l.   NEUROLOGIC: Cranial nerves II through XII are intact. No focal Motor or sensory deficits appreciated b/l PSYCHIATRIC: The patient is alert and oriented x 3. Good affect.  SKIN: No obvious rash, lesion,  or ulcer.   LABORATORY PANEL:   CBC Recent Labs  Lab 06/21/18 0628  WBC 6.5  HGB 7.7*  HCT 23.5*  PLT 265   ------------------------------------------------------------------------------------------------------------------  Chemistries  Recent Labs  Lab 06/21/18 0628  NA 134*  K 4.1  CL 101  CO2 21*  GLUCOSE 60*  BUN 30*  CREATININE 1.84*  CALCIUM 7.8*   ------------------------------------------------------------------------------------------------------------------  Cardiac Enzymes Recent Labs  Lab 06/21/18 0628  TROPONINI <0.03   ------------------------------------------------------------------------------------------------------------------  RADIOLOGY:  Dg Chest 2 View  Result Date: 06/21/2018 CLINICAL DATA:  Dyspnea, history of ovarian cancer EXAM: CHEST - 2 VIEW COMPARISON:  06/11/2018 chest radiograph. FINDINGS: Right internal jugular MediPort terminates at the cavoatrial junction. Stable cardiomediastinal silhouette with normal heart size. No pneumothorax. Trace bilateral pleural effusions. No pulmonary edema. No acute consolidative airspace disease. IMPRESSION: Trace bilateral pleural effusions. Otherwise no active pulmonary disease. Electronically Signed   By: Ilona Sorrel M.D.   On: 06/21/2018 07:19     IMPRESSION AND PLAN:   *Hypoglycemia due to poor oral intake.  Will discontinue metformin, Levemir and glimepiride. Check blood sugars every 2 hours.  Patient might be a candidate for just pre-meal insulin at discharge.  Will monitor blood sugars over 24 hours and make a decision.  Check hemoglobin A1c.  *Anemia of chronic disease is stable.  Transfuse to keep over 7.  *CKD stage III.  Monitor.  *Ovarian cancer, stage IV.  Seems to be worsening in spite of multiple chemotherapy agents.  Patient follows at [facility].  *Paroxysmal atrial fibrillation.  Continue amiodarone and Eliquis.  All the records are reviewed and case discussed with ED  provider. Management plans discussed with the patient, family and they are in agreement.  CODE STATUS: Partial code.  DNI  TOTAL TIME TAKING CARE OF THIS PATIENT: 35 minutes.   Leia Alf Vashti Bolanos M.D on 06/21/2018 at 8:51 AM  Between 7am to 6pm - Pager - [phone]  After 6pm go to [email or web address] - password [email or web address] [facility]  Office  [phone]  CC: Primary care physician; Lucille Passy, MD  Note: This dictation was prepared with Dragon dictation along with smaller phrase technology. Any transcriptional errors that result from this process are unintentional.

## 2018-06-21 NOTE — ED Notes (Signed)
Patient given orange juice, apple sauce and peanut butter crackers

## 2018-06-21 NOTE — ED Notes (Signed)
Pt BS 58. MD notified. Pt given 2 orange juices, 3 packs of crackers, and 2 peanut butters.

## 2018-06-21 NOTE — ED Notes (Signed)
Pt given Orange juice and crackers at this time.

## 2018-06-21 NOTE — ED Provider Notes (Signed)
[facility] Emergency Department Provider Note   ____________________________________________   First MD Initiated Contact with Patient 06/21/18 [phone]     (approximate)  I have reviewed the triage vital signs and the nursing notes.   HISTORY  Chief Complaint Respiratory Distress    HPI [name] is a 67 y.o. female here for evaluation of shortness of breath  Recent admission for treatment of pneumonia, symptomatic anemia, also adjustments to her diabetes meds  Becoming increasing short of breath for the last couple of days with walking.  Reports very short of breath with any walking now.  No chest pain.  No increasing shortness of breath with laying down.  She does sleep on a few pillows normally.  No chest pain.  No fevers or chills.  No productive cough.  Feels well when she is laying still, but walking she will immediately start to feel very short of breath.  Compliant with her blood thinner   Past Medical History:  Diagnosis Date  . Acute upper respiratory infections of unspecified site   . Anemia, unspecified   . Anxiety state, unspecified   . Cellulitis   . Chronic diastolic heart failure ([place])   . Congestive heart failure, unspecified   . Contact dermatitis and other eczema, due to unspecified cause   . Depressive disorder, not elsewhere classified   . Diabetes mellitus   . Essential hypertension, benign   . Mastoiditis   . Other and unspecified hyperlipidemia   . Other diseases of lung, not elsewhere classified   . Other dyspnea and respiratory abnormality   . Recent retinal detachment, partial, with giant tear   . Type II or unspecified type diabetes mellitus with peripheral circulatory disorders, not stated as uncontrolled(250.70)   . Unspecified asthma, with exacerbation   . Unspecified urinary incontinence   . Wheezing     Patient Active Problem List   Diagnosis Date Noted  . HCAP (healthcare-associated pneumonia)   .  Acute pulmonary edema (HCC)   . Goals of care, counseling/discussion   . Acute respiratory failure ([place]) 06/09/2018  . Cellulitis of left ear 09/22/2017  . Atrial flutter ([place]) 04/17/2017  . Edema 09/02/2016  . Need for home health care 07/17/2015  . Hyperglycemia 04/13/2015  . Hyperkalemia 04/13/2015  . Ovarian cancer ([place]) 04/07/2015  . Iron deficiency anemia 04/07/2015  . Pelvic mass in female   . Peritoneal carcinomatosis ([place])   . Dizziness and giddiness 08/09/2014  . OA (osteoarthritis) of finger 03/24/2014  . OBESITY, UNSPECIFIED 12/29/2009  . POSTMENOPAUSAL STATUS 08/09/2009  . RETINAL DETACHMENT 05/10/2009  . DIASTOLIC HEART FAILURE, CHRONIC 02/28/2009  . DYSPNEA ON EXERTION 02/28/2009  . RECENT RETINAL DETACHMENT PARTIAL W/GIANT TEAR 07/24/2008  . ANEMIA 12/08/2007  . ASTHMA, WITH ACUTE EXACERBATION 12/08/2007  . HYPERTENSION, BENIGN 11/03/2007  . Diabetes ([place]) 05/05/2007  . HLD (hyperlipidemia) 05/05/2007  . Anxiety state 05/05/2007  . DEPRESSION 05/05/2007  . Congestive heart failure ([place]) 01/20/2007  . LUNG NODULE 01/20/2007    Past Surgical History:  Procedure Laterality Date  . ABDOMINAL HYSTERECTOMY    . DILATION AND CURETTAGE OF UTERUS    . EYE SURGERY     Left retina repair- 11/09, 02-20-09  . KNEE SURGERY  2003   meniscus  . LAPAROSCOPIC GASTRIC BANDING  08/21/10    Prior to Admission medications   Medication Sig Start Date End Date Taking? Authorizing Provider  acetaminophen (TYLENOL) 325 MG tablet Take 2 tablets (650 mg total) by mouth  every 6 (six) hours as needed for mild pain (or Fever >/= 101). 06/11/18   Gouru, Aruna, MD  amiodarone (PACERONE) 200 MG tablet Take 200 mg by mouth daily. 04/12/17 06/09/18  [provider]  apixaban (ELIQUIS) 5 MG TABS tablet Take 5 mg by mouth 2 (two) times daily.    [provider]  atorvastatin (LIPITOR) 40 MG tablet Take 40 mg by mouth daily.    [provider]  Blood Glucose Monitoring  Suppl (ONE TOUCH ULTRA 2) w/Device KIT Check blood sugar three times a day and as directed. Dx K48.185 07/18/16   Lucille Passy, MD  busPIRone (BUSPAR) 10 MG tablet TAKE 1 TABLET BY MOUTH AT BEDTIME 09/03/17   Lucille Passy, MD  cephALEXin (KEFLEX) 500 MG capsule Take 500 mg by mouth 2 (two) times daily. 06/19/18 06/23/18  [provider]  diltiazem (CARDIZEM CD) 240 MG 24 hr capsule Take 240 mg by mouth daily. 04/12/17 06/09/18  [provider]  ferrous sulfate 325 (65 FE) MG EC tablet Take 1 tablet (325 mg total) by mouth 2 (two) times daily. 06/11/18 06/11/19  Nicholes Mango, MD  FLUoxetine (PROZAC) 20 MG capsule TAKE 3 CAPSULES BY MOUTH ONCE DAILY 04/07/18   Lucille Passy, MD  furosemide (LASIX) 20 MG tablet Take 1 tablet (20 mg total) by mouth every morning. 12/18/17   Lucille Passy, MD  glipiZIDE (GLUCOTROL) 5 MG tablet TAKE 1 TABLET BY MOUTH TWICE DAILY BEFORE MEAL(S) 05/27/18   Lucille Passy, MD  glucose blood (ONE TOUCH ULTRA TEST) test strip USE ONE STRIP TO CHECK GLUCOSE THREE TIMES DAILY AND  AS  DIRECTED 03/09/18   Lucille Passy, MD  LANTUS 100 UNIT/ML injection INJECT 30 UNITS SUBCUTANEOUSLY IN THE EVENING 05/01/18   Lucille Passy, MD  ondansetron (ZOFRAN) 8 MG tablet Take 1 tablet (8 mg total) by mouth every 8 (eight) hours as needed for nausea or vomiting. Will not make drowsy. 04/07/15   Gordy Levan, MD  ONETOUCH DELICA LANCETS FINE MISC USE TO TEST BLOOD SUGAR THREE TIMES DAILY AND AS DIRECTED 12/05/17   Lucille Passy, MD  pantoprazole (PROTONIX) 40 MG tablet Take 1 tablet by mouth 2 (two) times daily. 05/19/18   [provider]  tamsulosin (FLOMAX) 0.4 MG CAPS capsule Take 1 capsule by mouth daily. 04/14/18   [provider]  valACYclovir (VALTREX) 1000 MG tablet Take 1,000 mg by mouth daily. 05/22/18   [provider]    Allergies Aspirin; Lorazepam; Codeine; and Other  Family History  Problem Relation Age of Onset  . Cancer Mother        breast   . Cancer Sister        breast  . Cancer Maternal Aunt        breast  . Cancer Maternal Uncle        breast  . Cancer Maternal Grandmother        breast  . Cancer Maternal Uncle     Social History Social History   Tobacco Use  . Smoking status: Never Smoker  . Smokeless tobacco: Never Used  Substance Use Topics  . Alcohol use: No    Alcohol/week: 0.0 standard drinks  . Drug use: No    Review of Systems Constitutional: No fever/chills Eyes: No visual changes. ENT: No sore throat. Cardiovascular: Denies chest pain. Respiratory: Denies shortness of breath. Gastrointestinal: No abdominal pain.  Very poor appetite for several months.  For the  last several months will occasionally vomit after eating but reports she is been eating fairly well the last 2 days Genitourinary: Negative for dysuria. Musculoskeletal: Negative for back pain. Skin: Negative for rash. Neurological: Negative for headaches, areas of focal weakness or numbness. Endocrine: Sugar low yesterday, yesterday morning and throughout the day as well.  Held did not take any of her insulin last night.  Continues to take her oral diabetes meds   ____________________________________________   PHYSICAL EXAM:  VITAL SIGNS: ED Triage Vitals  Enc Vitals Group     BP 06/21/18 0623 (!) 120/51     Pulse Rate 06/21/18 0623 79     Resp 06/21/18 0623 20     Temp 06/21/18 0623 98 F (36.7 C)     Temp Source 06/21/18 0623 Oral     SpO2 06/21/18 0623 98 %     Weight 06/21/18 0622 237 lb (107.5 kg)     Height 06/21/18 0622 5' 5"  (1.651 m)     Head Circumference --      Peak Flow --      Pain Score 06/21/18 0621 0     Pain Loc --      Pain Edu? --      Excl. in [place]? --     Constitutional: Alert and oriented. Well appearing and in no acute distress.  Somewhat chronically ill, but very pleasant in no distress. Eyes: Conjunctivae are normal. Head: Atraumatic. Nose: No congestion/rhinnorhea. Mouth/Throat: Mucous  membranes are moist. Neck: No stridor.  Cardiovascular: Normal rate, regular rhythm. Grossly normal heart sounds.  Good peripheral circulation. Respiratory: Normal respiratory effort.  No retractions. Lungs CTAB. Gastrointestinal: Soft and nontender. No distention. Musculoskeletal: No lower extremity tenderness is just a trace bilateral ankle edema.  No unilateral edema. Neurologic:  Normal speech and language. No gross focal neurologic deficits are appreciated.  Skin:  Skin is warm, dry and intact. No rash noted. Psychiatric: Mood and affect are normal. Speech and behavior are normal.  ____________________________________________   LABS (all labs ordered are listed, but only abnormal results are displayed)  Labs Reviewed  CBC WITH DIFFERENTIAL/PLATELET - Abnormal; Notable for the following components:      Result Value   RBC 2.62 (*)    Hemoglobin 7.7 (*)    HCT 23.5 (*)    RDW 16.0 (*)    Lymphs Abs 0.3 (*)    All other components within normal limits  BASIC METABOLIC PANEL - Abnormal; Notable for the following components:   Sodium 134 (*)    CO2 21 (*)    Glucose, Bld 60 (*)    BUN 30 (*)    Creatinine, Ser 1.84 (*)    Calcium 7.8 (*)    GFR calc non Af Amer 27 (*)    GFR calc Af Amer 31 (*)    All other components within normal limits  GLUCOSE, CAPILLARY - Abnormal; Notable for the following components:   Glucose-Capillary 58 (*)    All other components within normal limits  GLUCOSE, CAPILLARY - Abnormal; Notable for the following components:   Glucose-Capillary 59 (*)    All other components within normal limits  TROPONIN I  BRAIN NATRIURETIC PEPTIDE  GLUCOSE, CAPILLARY  CBG MONITORING, ED  CBG MONITORING, ED  TYPE AND SCREEN   ____________________________________________  EKG  Reviewed and interpreted at 7 AM Heart rate 80 QRS 160 QTc 500 Normal sinus rhythm left bundle branch block.  Old left  bundle ____________________________________________  RADIOLOGY  Dg Chest  2 View  Result Date: 06/21/2018 CLINICAL DATA:  Dyspnea, history of ovarian cancer EXAM: CHEST - 2 VIEW COMPARISON:  06/11/2018 chest radiograph. FINDINGS: Right internal jugular MediPort terminates at the cavoatrial junction. Stable cardiomediastinal silhouette with normal heart size. No pneumothorax. Trace bilateral pleural effusions. No pulmonary edema. No acute consolidative airspace disease. IMPRESSION: Trace bilateral pleural effusions. Otherwise no active pulmonary disease. Electronically Signed   By: Ilona Sorrel M.D.   On: 06/21/2018 07:19    ____________________________________________   PROCEDURES  Procedure(s) performed: None  Procedures  Critical Care performed: No  ____________________________________________   INITIAL IMPRESSION / ASSESSMENT AND PLAN / ED COURSE  Pertinent labs & imaging results that were available during my care of the patient were reviewed by me and considered in my medical decision making (see chart for details).   Dyspnea.  May be very much multifactorial, but suspect likely today due to symptomatic anemia.  Discussed with patient's primary care team, Dr. Lorelei Pont who after discussion and consideration of outpatient services and ability to follow-up in reasonably receive a transfusion as an outpatient and monitor blood glucoses, she recommends patient be consider to be admitted for observation further management of her diabetes given ongoing hypoglycemia despite having stopped her home insulin and also being on glipizide.  Patient hemodynamically stable.  No pleuritic pain.  No chest pain.  Lab work demonstrates again declining hemoglobin over the last few days.  No obvious source of bleeding to noted in clinical taking.      ____________________________________________   FINAL CLINICAL IMPRESSION(S) / ED DIAGNOSES  Final diagnoses:  Symptomatic anemia  Hypoglycemia  secondary to sulfonylurea, accidental or unintentional, initial encounter        Note:  This document was prepared using Dragon voice recognition software and may include unintentional dictation errors       Delman Kitten, MD 06/21/18 [phone]

## 2018-06-21 NOTE — Progress Notes (Signed)
Pt reports she did perform her bedtime insulin last night; POCT glucose 94. Pt eager to eat and then had sudden onset nausea/vomited approximately 200 ml undigested food. Zofran IV given effective. Family members at bedside and very active in pt care/support. SOB on exertion to BR which resolved with rest.  Denies pain.  Will continue to monitor blood sugars.

## 2018-06-21 NOTE — Progress Notes (Signed)
Initial Nutrition Assessment  DOCUMENTATION CODES:   Morbid obesity  INTERVENTION:  Provide Premier Protein po BID, each supplement provides 160 kcal and 30 grams of protein. Patient prefers chocolate.  Provide daily MVI.  Encouraged intake of small, frequent meals throughout the day in setting of nausea and early satiety. Discussed choosing a source of protein at each meal/snack.  NUTRITION DIAGNOSIS:   Inadequate oral intake related to decreased appetite, early satiety, nausea, vomiting as evidenced by per patient/family report.  GOAL:   Patient will meet greater than or equal to 90% of their needs  MONITOR:   PO intake, Supplement acceptance, Labs, Weight trends, Skin, I & O's  REASON FOR ASSESSMENT:   Malnutrition Screening Tool, Consult Assessment of nutrition requirement/status  ASSESSMENT:   67 year old female with PMHx of chronic diastolic heart failure, HTN, HLD, DM type 2, anxiety, depression, asthma, stage IV ovarian cancer on chemotherapy who is admitted with hypoglycemia due to poor oral intake.   -Metformin, Levemir, and glimepiride were discontinued for now.  Met with patient and her family members at bedside. Patient reports she has had a decreased appetite for a few weeks now. She is experiencing early satiety and N/V. She also reports occasional constipation, but reports that is baseline for her. Her blood sugars have been well-controlled at home and are usually around 110 when she checks. She has been drinking an Atkins protein shake at home occasionally. She reports yesterday she had one small powdered donut in the morning, she had 1/2 of a PB&J a few hours later followed by the other half a few hours after that, and then for dinner she had eggs, bacon, and a bagel.  UBW 250 lbs (113.6 kg). Patient reports she has lost 10 lbs over the last 1-2 weeks. Weight looks fairly stable per chart at 107.9-110.3 kg over the past year.  Medications reviewed and  include: amiodarone, Eliquis, ferrous sulfate 325 mg BID, Lasix 20 mg every morning, pantoprazole, Flomax.  Labs reviewed: CBG 58-94, Sodium 134, CO2 21, BUN 30, Creatinine 1.84, BNP 144.  Patient does not meet criteria for malnutrition at this time but is at risk for development of malnutrition.  Discussed with RN.  NUTRITION - FOCUSED PHYSICAL EXAM:    Most Recent Value  Orbital Region  No depletion  Upper Arm Region  No depletion  Thoracic and Lumbar Region  No depletion  Buccal Region  No depletion  Temple Region  No depletion  Clavicle Bone Region  No depletion  Clavicle and Acromion Bone Region  No depletion  Scapular Bone Region  No depletion  Dorsal Hand  No depletion  Patellar Region  Mild depletion  Anterior Thigh Region  Mild depletion  Posterior Calf Region  Moderate depletion  Edema (RD Assessment)  None  Hair  Reviewed  Eyes  Reviewed  Mouth  Reviewed  Skin  Reviewed  Nails  Reviewed     Diet Order:   Diet Order            Diet Carb Modified Fluid consistency: Thin; Room service appropriate? Yes  Diet effective now              EDUCATION NEEDS:   Education needs have been addressed  Skin:  Skin Assessment: Skin Integrity Issues:(MSAD to perineum)  Last BM:  PTA (06/20/2018 per chart)  Height:   Ht Readings from Last 1 Encounters:  06/21/18 '5\' 5"'$  (1.651 m)    Weight:   Wt Readings from Last 1  Encounters:  06/21/18 109.6 kg    Ideal Body Weight:     BMI:  Body mass index is 40.21 kg/m.  Estimated Nutritional Needs:   Kcal:  0479-9872  Protein:  100-115 grams  Fluid:  1.9-2.2 L/day  Willey Blade, MS, RD, LDN Office: [phone] Pager: [phone] After Hours/Weekend Pager: [phone]

## 2018-06-21 NOTE — Progress Notes (Signed)
Advance care planning  Purpose of Encounter Hypoglycemia, anemia, ovarian cancer, CODE STATUS discussion  Parties in Attendance Patient, son and nephew at bedside.  Patients Decisional capacity Patient is alert and oriented.  Able to make medical decisions.  Her husband is the healthcare power of attorney.  Unfortunately he is admitted at [facility].  She wants her son Catalina [place] to be her healthcare power of attorney this admission.  Patient has been on treatment for ovarian cancer with multiple chemotherapy agents.  In spite of this ovarian cancer is slowly worsening.  She understands that it is incurable.  She is hopeful of palliative measures and is waiting for her next chemotherapy cycle soon and [facility].  Discussed regarding hypoglycemia, anemia and need for observation.  All questions answered.  CODE STATUS discussed.  Patient does not want to be intubated.  She is okay to have CPR and defibrillation if needed along with medications.  Full scope of treatment at this time.  Time spent -20 minutes

## 2018-06-22 LAB — CBC
HEMATOCRIT: 23.5 % — AB (ref 35.0–47.0)
Hemoglobin: 7.6 g/dL — ABNORMAL LOW (ref 12.0–16.0)
MCH: 29.2 pg (ref 26.0–34.0)
MCHC: 32.4 g/dL (ref 32.0–36.0)
MCV: 90 fL (ref 80.0–100.0)
Platelets: 272 10*3/uL (ref 150–440)
RBC: 2.61 MIL/uL — AB (ref 3.80–5.20)
RDW: 15.8 % — AB (ref 11.5–14.5)
WBC: 5.2 10*3/uL (ref 3.6–11.0)

## 2018-06-22 LAB — BASIC METABOLIC PANEL
Anion gap: 7 (ref 5–15)
BUN: 28 mg/dL — AB (ref 8–23)
CO2: 24 mmol/L (ref 22–32)
Calcium: 7.9 mg/dL — ABNORMAL LOW (ref 8.9–10.3)
Chloride: 105 mmol/L (ref 98–111)
Creatinine, Ser: 1.49 mg/dL — ABNORMAL HIGH (ref 0.44–1.00)
GFR, EST AFRICAN AMERICAN: 40 mL/min — AB (ref >60)
GFR, EST NON AFRICAN AMERICAN: 35 mL/min — AB (ref >60)
Glucose, Bld: 120 mg/dL — ABNORMAL HIGH (ref 70–99)
POTASSIUM: 4.6 mmol/L (ref 3.5–5.1)
Sodium: 136 mmol/L (ref 135–145)

## 2018-06-22 LAB — GLUCOSE, CAPILLARY
GLUCOSE-CAPILLARY: 116 mg/dL — AB (ref 70–99)
GLUCOSE-CAPILLARY: 118 mg/dL — AB (ref 70–99)
GLUCOSE-CAPILLARY: 128 mg/dL — AB (ref 70–99)
Glucose-Capillary: 111 mg/dL — ABNORMAL HIGH (ref 70–99)
Glucose-Capillary: 118 mg/dL — ABNORMAL HIGH (ref 70–99)
Glucose-Capillary: 125 mg/dL — ABNORMAL HIGH (ref 70–99)

## 2018-06-22 MED ORDER — ADULT MULTIVITAMIN W/MINERALS CH: 1.0000 | ORAL_TABLET | Freq: Every day | ORAL | 1 refills | Status: AC

## 2018-06-22 NOTE — Care Management Note (Signed)
Case Management Note  Patient Details  Name: [name] MRN: [id number] Date of Birth: 02-20-1951  Subjective/Objective:                Patient is under observation status for Hypoglycemia.  PCP verified at Dr. Arnette Norris last visit was Jun 18 2018, pharmacy is [facility], no barriers to obtaining prescription medications identified.  Patient lives at home with her husband, [name], and her son, [name].  Currently her husband is admitted to [facility] in [place] s/p toe amputation. Patient states she is planning on being discharged today and she will stay with her nephew tonight and her sister tomorrow since she and her family decided she should not stay alone.  Patient has a 4 wheel rolling walker and a 2 wheel rolling walker at home but does not use either one, states she does not need them yet.  She also reports that she has a shower chair with no back but was told that she should have one with a back, she has not gotten one yet.  Her husband drives and provides transportation for her, when he is not available or not well, she has 2 children and one of them will drive her.  Patient is open to [place] services if recommended but she needs to know how much the cost will be for her out of pocket.       Action/Plan:   Expected Discharge Date:                  Expected Discharge Plan:  Home/Self Care  In-House Referral:     Discharge planning Services  CM Consult  Post Acute Care Choice:    Choice offered to:     DME Arranged:    DME Agency:     HH Arranged:    HH Agency:     Status of Service:  In process, will continue to follow  If discussed at Long Length of Stay Meetings, dates discussed:    Additional Comments:  Shelbie Hutching, RN 06/22/2018, 9:19 AM

## 2018-06-22 NOTE — Discharge Summary (Signed)
[name] NAME: [name]    MR#:  [id number]  DATE OF BIRTH:  [birth year]  DATE OF ADMISSION:  06/21/2018 ADMITTING PHYSICIAN: Hillary Bow, MD  DATE OF DISCHARGE: 06/22/2018  PRIMARY CARE PHYSICIAN: Lucille Passy, MD    ADMISSION DIAGNOSIS:  Symptomatic anemia [D64.9] Hypoglycemia secondary to sulfonylurea, accidental or unintentional, initial encounter [T38.3X1A, E16.0]  DISCHARGE DIAGNOSIS:  hypoglycemia resolved anemia of chronic disease history of ovarian cancer undergoing chemo at [facility]  SECONDARY DIAGNOSIS:   Past Medical History:  Diagnosis Date  . Acute upper respiratory infections of unspecified site   . Anemia, unspecified   . Anxiety state, unspecified   . Cellulitis   . Chronic diastolic heart failure ([place])   . Congestive heart failure, unspecified   . Contact dermatitis and other eczema, due to unspecified cause   . Depressive disorder, not elsewhere classified   . Diabetes mellitus   . Essential hypertension, benign   . Mastoiditis   . Other and unspecified hyperlipidemia   . Other diseases of lung, not elsewhere classified   . Other dyspnea and respiratory abnormality   . Recent retinal detachment, partial, with giant tear   . Type II or unspecified type diabetes mellitus with peripheral circulatory disorders, not stated as uncontrolled(250.70)   . Unspecified asthma, with exacerbation   . Unspecified urinary incontinence   . Wheezing     HOSPITAL COURSE:  [name]  is a 67 y.o. female with a known history of insulin-dependent diabetes mellitus, ovarian cancer, hypertension, CKD stage III, morbid obesity, asthma, diastolic CHF presents to the emergency room complaining of worsening shortness of breath.  Her shortness of breath has been worsening over the past 5 months.  This has been deemed to be multifactorial because of asthma, COPD, ovarian cancer, morbid obesity.  Patient has  been found to be hypoglycemic and was given glucose, orange juice and continues to be hypoglycemic  *Hypoglycemia due to poor oral intake.   -Will discontinue metformin, Levemir and glimepiride. Sugars of better.   -patient advised to keep log of her sugars at home. She discussed with Dr. Marjory Lies as outpatient. Currently she is off all diabetes meds.  *Anemia of chronic disease is stable.  Transfuse to keep over 7. -Patient has been worked up as outpatient for her anemia. She has multifactorial reasons for anemia especially her chemo ongoing along with history of wearing cancer and anemia of chronic disease. She is not had any history of bleeding. She just started taking some iron pills. I did offer her to see if she wanted blood transfusion however since she is asymptomatic she prefers discussing with Dr. Richardean Canal gynecology oncology and work it out as outpatient.  *CKD stage III.  Monitor.  *Ovarian cancer, stage IV.  Seems to be worsening in spite of multiple chemotherapy agents.  Patient follows at [facility] cancer center.  *Paroxysmal atrial fibrillation.  Continue amiodarone and Eliquis.  Overall hemodynamically stable. Will discharge to home. Patient is agreeable with plan. CONSULTS OBTAINED:    DRUG ALLERGIES:   Allergies  Allergen Reactions  . Aspirin Other (See Comments)    asthma  . Lorazepam Shortness Of Breath  . Antivert [Meclizine]   . Codeine Nausea And Vomiting  . Other Dermatitis    Tape adhesive and surgical glue     DISCHARGE MEDICATIONS:   Allergies as of 06/22/2018      Reactions   Aspirin Other (See Comments)  asthma   Lorazepam Shortness Of Breath   Antivert [meclizine]    Codeine Nausea And Vomiting   Other Dermatitis   Tape adhesive and surgical glue       Medication List    STOP taking these medications   cephALEXin 500 MG capsule Commonly known as:  KEFLEX   glipiZIDE 5 MG tablet Commonly known as:  GLUCOTROL   glucose blood test  strip   LANTUS 100 UNIT/ML injection Generic drug:  insulin glargine   ONE TOUCH ULTRA 2 w/Device Kit   ONETOUCH DELICA LANCETS FINE Misc     TAKE these medications   acetaminophen 325 MG tablet Commonly known as:  TYLENOL Take 2 tablets (650 mg total) by mouth every 6 (six) hours as needed for mild pain (or Fever >/= 101).   amiodarone 200 MG tablet Commonly known as:  PACERONE Take 200 mg by mouth daily.   atorvastatin 40 MG tablet Commonly known as:  LIPITOR Take 40 mg by mouth daily.   busPIRone 10 MG tablet Commonly known as:  BUSPAR TAKE 1 TABLET BY MOUTH AT BEDTIME   diltiazem 240 MG 24 hr capsule Commonly known as:  CARDIZEM CD Take 240 mg by mouth daily. What changed:  Another medication with the same name was removed. Continue taking this medication, and follow the directions you see here.   ELIQUIS 5 MG Tabs tablet Generic drug:  apixaban Take 5 mg by mouth 2 (two) times daily.   ferrous sulfate 325 (65 FE) MG EC tablet Take 1 tablet (325 mg total) by mouth 2 (two) times daily.   FLUoxetine 20 MG capsule Commonly known as:  PROZAC TAKE 3 CAPSULES BY MOUTH ONCE DAILY   furosemide 20 MG tablet Commonly known as:  LASIX Take 1 tablet (20 mg total) by mouth every morning.   multivitamin with minerals Tabs tablet Take 1 tablet by mouth daily. Start taking on:  06/23/2018   ondansetron 8 MG tablet Commonly known as:  ZOFRAN Take 1 tablet (8 mg total) by mouth every 8 (eight) hours as needed for nausea or vomiting. Will not make drowsy.   pantoprazole 40 MG tablet Commonly known as:  PROTONIX Take 1 tablet by mouth 2 (two) times daily.   tamsulosin 0.4 MG Caps capsule Commonly known as:  FLOMAX Take 1 capsule by mouth daily.   valACYclovir 1000 MG tablet Commonly known as:  VALTREX Take 1,000 mg by mouth daily.       If you experience worsening of your admission symptoms, develop shortness of breath, life threatening emergency, suicidal or  homicidal thoughts you must seek medical attention immediately by calling 911 or calling your MD immediately  if symptoms less severe.  You Must read complete instructions/literature along with all the possible adverse reactions/side effects for all the Medicines you take and that have been prescribed to you. Take any new Medicines after you have completely understood and accept all the possible adverse reactions/side effects.   Please note  You were cared for by a hospitalist during your hospital stay. If you have any questions about your discharge medications or the care you received while you were in the hospital after you are discharged, you can call the unit and asked to speak with the hospitalist on call if the hospitalist that took care of you is not available. Once you are discharged, your primary care physician will handle any further medical issues. Please note that NO REFILLS for any discharge medications will be authorized once  you are discharged, as it is imperative that you return to your primary care physician (or establish a relationship with a primary care physician if you do not have one) for your aftercare needs so that they can reassess your need for medications and monitor your lab values. Today   SUBJECTIVE    Patient states she is feels a lot better. She wants to go home. VITAL SIGNS:  Blood pressure (!) 107/52, pulse 77, temperature 98.2 F (36.8 C), temperature source Oral, resp. rate 18, height _0  (1.651 m), weight 108.2 kg, SpO2 99 %.  I/O:    Intake/Output Summary (Last 24 hours) at 06/22/2018 1122 Last data filed at 06/22/2018 1018 Gross per 24 hour  Intake 720 ml  Output -  Net 720 ml    PHYSICAL EXAMINATION:  GENERAL:  67 y.o.-year-old patient lying in the bed with no acute distress. Obese EYES: Pupils equal, round, reactive to light and accommodation. No scleral icterus. Extraocular muscles intact.  HEENT: Head atraumatic, normocephalic. Oropharynx and  nasopharynx clear.  NECK:  Supple, no jugular venous distention. No thyroid enlargement, no tenderness.  LUNGS: Normal breath sounds bilaterally, no wheezing, rales,rhonchi or crepitation. No use of accessory muscles of respiration.  CARDIOVASCULAR: S1, S2 normal. No murmurs, rubs, or gallops.  ABDOMEN: Soft, non-tender, non-distended. Bowel sounds present. No organomegaly or mass.  EXTREMITIES: No pedal edema, cyanosis, or clubbing.  NEUROLOGIC: Cranial nerves II through XII are intact. Muscle strength 5/5 in all extremities. Sensation intact. Gait not checked.  PSYCHIATRIC: The patient is alert and oriented x 3.  SKIN: No obvious rash, lesion, or ulcer.   DATA REVIEW:   CBC  Recent Labs  Lab 06/22/18 0505  WBC 5.2  HGB 7.6*  HCT 23.5*  PLT 272    Chemistries  Recent Labs  Lab 06/22/18 0505  NA 136  K 4.6  CL 105  CO2 24  GLUCOSE 120*  BUN 28*  CREATININE 1.49*  CALCIUM 7.9*    Microbiology Results   No results found for this or any previous visit (from the past 240 hour(s)).  RADIOLOGY:  Dg Chest 2 View  Result Date: 06/21/2018 CLINICAL DATA:  Dyspnea, history of ovarian cancer EXAM: CHEST - 2 VIEW COMPARISON:  06/11/2018 chest radiograph. FINDINGS: Right internal jugular MediPort terminates at the cavoatrial junction. Stable cardiomediastinal silhouette with normal heart size. No pneumothorax. Trace bilateral pleural effusions. No pulmonary edema. No acute consolidative airspace disease. IMPRESSION: Trace bilateral pleural effusions. Otherwise no active pulmonary disease. Electronically Signed   By: Ilona Sorrel M.D.   On: 06/21/2018 07:19     Management plans discussed with the patient, family and they are in agreement.  CODE STATUS:     Code Status Orders  (From admission, onward)         Start     Ordered   06/21/18 0850  Limited resuscitation (code)  Continuous    Question Answer Comment  In the event of cardiac or respiratory ARREST: Initiate Code  Blue, Call Rapid Response Yes   In the event of cardiac or respiratory ARREST: Perform CPR Yes   In the event of cardiac or respiratory ARREST: Perform Intubation/Mechanical Ventilation No   In the event of cardiac or respiratory ARREST: Use NIPPV/BiPAp only if indicated Yes   In the event of cardiac or respiratory ARREST: Administer ACLS medications if indicated Yes   In the event of cardiac or respiratory ARREST: Perform Defibrillation or Cardioversion if indicated Yes  06/21/18 0850        Code Status History    Date Active Date Inactive Code Status Order ID Comments User Context   06/09/2018 2100 06/11/2018 1820 Full Code [id number]  Nicholes Mango, MD Inpatient      TOTAL TIME TAKING CARE OF THIS PATIENT: *40 minutes.    Fritzi Mandes M.D on 06/22/2018 at 11:22 AM  Between 7am to 6pm - Pager - [phone] After 6pm go to [email or web address] - password [email or web address] [facility]  Office  [phone]  CC: Primary care physician; Lucille Passy, MD

## 2018-06-22 NOTE — Care Management Obs Status (Signed)
[facility] NOTIFICATION   Patient Details  Name: [name] MRN: [id number] Date of Birth: 16-Jun-1951   Medicare Observation Status Notification Given:  Yes Observation status reviewed with the patient and she verbalized understanding she gave permission for me to mark the signature space with an X, she was having a hard time seeing where to sign.     Shelbie Hutching, RN 06/22/2018, 9:17 AM

## 2018-06-22 NOTE — Care Management Note (Signed)
Case Management Note  Patient Details  Name: [name] MRN: [id number] Date of Birth: Jun 02, 1951  Subjective/Objective:           CM consulted for Home health services- no needs identified at this time, will continue to follow until discharge in case needs change.            Action/Plan:   Expected Discharge Date:                  Expected Discharge Plan:  Home/Self Care  In-House Referral:     Discharge planning Services  CM Consult  Post Acute Care Choice:    Choice offered to:     DME Arranged:    DME Agency:     HH Arranged:    HH Agency:     Status of Service:  In process, will continue to follow  If discussed at Long Length of Stay Meetings, dates discussed:    Additional Comments:  Shelbie Hutching, RN 06/22/2018, 9:48 AM

## 2018-06-22 NOTE — Plan of Care (Signed)
  Problem: Education: Goal: Knowledge of General Education information will improve Description Including pain rating scale, medication(s)/side effects and non-pharmacologic comfort measures Outcome: Progressing   Problem: Health Behavior/Discharge Planning: Goal: Ability to manage health-related needs will improve Outcome: Progressing   Problem: Clinical Measurements: Goal: Ability to maintain clinical measurements within normal limits will improve Outcome: Progressing Goal: Will remain free from infection Outcome: Progressing Goal: Diagnostic test results will improve Outcome: Progressing Goal: Respiratory complications will improve Outcome: Progressing   Problem: Activity: Goal: Risk for activity intolerance will decrease Outcome: Progressing   Problem: Nutrition: Goal: Adequate nutrition will be maintained Outcome: Progressing   Problem: Pain Managment: Goal: General experience of comfort will improve Outcome: Progressing   Problem: Safety: Goal: Ability to remain free from injury will improve Outcome: Progressing   Problem: Skin Integrity: Goal: Risk for impaired skin integrity will decrease Outcome: Progressing

## 2018-06-22 NOTE — Discharge Instructions (Signed)
Patient is advised to check her sugars at least three times a day keep a log of it for Dr. Marjory Lies to review. She is currently off all diabetes meds  Patient will call her oncologist Dr. Fransisca Connors at [facility] regarding her hemoglobin.

## 2018-06-23 ENCOUNTER — Telehealth: Payer: Self-pay | Admitting: Behavioral Health

## 2018-06-23 NOTE — Telephone Encounter (Signed)
Transition Care Management Follow-up Telephone Call  DATE OF ADMISSION:  06/21/2018      ADMITTING PHYSICIAN: Hillary Bow, MD  DATE OF DISCHARGE: 06/22/2018  PRIMARY CARE PHYSICIAN: Lucille Passy, MD     How have you been since you were released from the hospital? Patient stated, "I've been good since discharge".   Do you understand why you were in the hospital? yes, patient voiced that she was hypoglycemic.   Do you understand the discharge instructions? yes   Where were you discharged to? Home   Items Reviewed:  Medications reviewed: yes, patient stated that she's "not taking insulin, glyburide or metformin at this time."  Allergies reviewed: yes  Dietary changes reviewed: yes, she voiced consuming a 2,000 calorie diet.  Referrals reviewed: yes, follow-up with PCP & Oncologist at [facility].   Functional Questionnaire:   Activities of Daily Living (ADLs):   She states they are independent in the following: ambulation, bathing and hygiene, feeding, continence, grooming, toileting and dressing States they require assistance with the following: None   Any transportation issues/concerns?: no   Any patient concerns? no   Confirmed importance and date/time of follow-up visits scheduled yes, 06/29/18 at 11:00 AM.  Provider Appointment booked with Dr. Deborra Medina.  Confirmed with patient if condition begins to worsen call PCP or go to the ER.  Patient was given the office number and encouraged to call back with question or concerns.  : yes

## 2018-06-29 ENCOUNTER — Inpatient Hospital Stay: Payer: Medicare Other | Admitting: Family Medicine

## 2018-07-03 ENCOUNTER — Telehealth: Payer: Self-pay | Admitting: Family Medicine

## 2018-07-03 MED ORDER — INSULIN GLARGINE 100 UNIT/ML SOLOSTAR PEN: PEN_INJECTOR | SUBCUTANEOUS | 99 refills | Status: AC

## 2018-07-03 NOTE — Telephone Encounter (Signed)
Noted.  Lantus eRx sent to [facility] on file.  Please have her update [place] with her finger stick blood sugar readings.

## 2018-07-03 NOTE — Telephone Encounter (Signed)
It sounds like her blood sugar has been running very low.  I am concerned about restarting her medications and her sugars getting too low. Metformin also is hard on the kidneys and her kidney function was low when last checked.  I would prefer if we start her back on low dose lantus, like 5- 10 units at bedtime.  Please let me know how she feels about this.

## 2018-07-03 NOTE — Telephone Encounter (Signed)
Spoke with patient regarding refilling the Metformin. I didn't see this medication on patients med list. Looking at the discharge summary patient diabetes medications were all discontinued. Patient states that she checked her blood sugar this morning an it was 194 so she took Metformin and Glimperide. Patient states last week was the last time she took Lantus. Would you like for a refill to be sent into patient pharmacy for Metformin or would you like to discuss at patient follow up appointment 10/28?Please advise. Thank you.

## 2018-07-03 NOTE — Telephone Encounter (Signed)
Copied from [place] [phone]. Topic: Quick Communication - See Telephone Encounter >> Jul 03, 2018  9:20 AM Conception Chancy, NT wrote: CRM for notification. See Telephone encounter for: 07/03/18.  Patient is calling requesting a refill on metFORMIN (GLUCOPHAGE) 500 MG tablet. Patient husband is in the hospital and was not able to make her last appointment. She is completely out of this medication and has an appointment on 07/13/18 with Dr. Deborra Medina for her first availability. Patient would like to know if she can get enough medication to get her to that appointment.  [facility] [address], [place] [address] Phone: [phone] Fax: [phone]

## 2018-07-03 NOTE — Telephone Encounter (Signed)
Spoke with patient and gave PCP recommendation. Patient agreed to start the lose dose lantus 5-10 units at bedtime and to hold off on the metformin and glimperide. Patient understood and had no further questions.

## 2018-07-13 ENCOUNTER — Ambulatory Visit: Payer: Medicare Other | Admitting: Family Medicine

## 2018-07-13 ENCOUNTER — Encounter: Payer: Self-pay | Admitting: Family Medicine

## 2018-07-13 VITALS — BP 116/64 | HR 68 | Temp 98.4°F | Ht 65.0 in | Wt 225.0 lb

## 2018-07-13 DIAGNOSIS — K219 Gastro-esophageal reflux disease without esophagitis: Secondary | ICD-10-CM | POA: Diagnosis not present

## 2018-07-13 DIAGNOSIS — E08311 Diabetes mellitus due to underlying condition with unspecified diabetic retinopathy with macular edema: Secondary | ICD-10-CM

## 2018-07-13 DIAGNOSIS — E162 Hypoglycemia, unspecified: Secondary | ICD-10-CM

## 2018-07-13 DIAGNOSIS — C569 Malignant neoplasm of unspecified ovary: Secondary | ICD-10-CM

## 2018-07-13 MED ORDER — NYSTATIN 100000 UNIT/GM EX POWD: Freq: Four times a day (QID) | CUTANEOUS | 0 refills | Status: AC

## 2018-07-13 MED ORDER — PANTOPRAZOLE SODIUM 40 MG PO TBEC: 40.0000 mg | DELAYED_RELEASE_TABLET | Freq: Two times a day (BID) | ORAL | 3 refills | Status: AC

## 2018-07-13 MED ORDER — FUROSEMIDE 20 MG PO TABS: 20.0000 mg | ORAL_TABLET | Freq: Every morning | ORAL | 3 refills | Status: AC

## 2018-07-13 MED ORDER — ONDANSETRON HCL 8 MG PO TABS: 8.0000 mg | ORAL_TABLET | Freq: Three times a day (TID) | ORAL | 1 refills | Status: AC | PRN

## 2018-07-13 NOTE — Patient Instructions (Addendum)
Great to see you.  Restart Protonix 40 mg twice daily.  Please decrease your lantus to 20 units nightly and keep checking blood sugars twice daily.  Please keep me updated.

## 2018-07-13 NOTE — Progress Notes (Signed)
Subjective:   Patient ID: [name], female    DOB: 01-Apr-1951, 67 y.o.   MRN: [id number]  [name] is a pleasant 67 y.o. year old female who presents to clinic today with Hospitalization Follow-up (was in the hospital due to Hypoglycemia-she has been improving. Was taking Zantac has discontinued-was prescribed Protonix-)  on 07/13/2018  HPI:  Admitted to [facility] 10/6- 06/22/18.  Notes reviewed.  She has a known h/o DM, metastatic ovarian CA and diastolic CHF.  Presented to the ED with worsening SOB.  Was found to be hypoglycemic in the ED.  All of her diabetes medications were stopped. She restarted lantus 30 units about two weeks ago.  Has had some low blood sugars in the morning but most fasting FSBS are running around 116-120.  She is not taking any of her other diabetes medication.  She also recently decided to stop chemotherapy, has an appointment this Friday with her oncologist.  She admits to not eating as much.  Feels food is getting stuck in her throat and she feels bile coming up when she lies down.   she is out of protonix.  Ranitidine has not been as effective.  Wt Readings from Last 3 Encounters:  07/13/18 225 lb (102.1 kg)  06/22/18 238 lb 9.6 oz (108.2 kg)  06/18/18 237 lb 12.8 oz (107.9 kg)      Lab Results  Component Value Date   HGBA1C 6.4 (H) 06/09/2018   Current Outpatient Medications on File Prior to Visit  Medication Sig Dispense Refill  . acetaminophen (TYLENOL) 325 MG tablet Take 2 tablets (650 mg total) by mouth every 6 (six) hours as needed for mild pain (or Fever >/= 101).    Marland Kitchen apixaban (ELIQUIS) 5 MG TABS tablet Take 5 mg by mouth 2 (two) times daily.    Marland Kitchen atorvastatin (LIPITOR) 40 MG tablet Take 40 mg by mouth daily.    . busPIRone (BUSPAR) 10 MG tablet TAKE 1 TABLET BY MOUTH AT BEDTIME 90 tablet 2  . diltiazem (CARDIZEM CD) 240 MG 24 hr capsule Take 240 mg by mouth daily.    . ferrous sulfate 325 (65 FE) MG EC tablet Take 1 tablet  (325 mg total) by mouth 2 (two) times daily. 60 tablet 3  . FLUoxetine (PROZAC) 20 MG capsule TAKE 3 CAPSULES BY MOUTH ONCE DAILY 270 capsule 2  . Insulin Glargine (LANTUS SOLOSTAR) 100 UNIT/ML Solostar Pen 5- 10 units at bedtime as directed. 5 pen PRN  . Multiple Vitamin (MULTIVITAMIN WITH MINERALS) TABS tablet Take 1 tablet by mouth daily. 30 tablet 1  . tamsulosin (FLOMAX) 0.4 MG CAPS capsule Take 1 capsule by mouth daily.    . valACYclovir (VALTREX) 1000 MG tablet Take 1,000 mg by mouth daily.  5  . amiodarone (PACERONE) 200 MG tablet Take 200 mg by mouth daily.     No current facility-administered medications on file prior to visit.     Allergies  Allergen Reactions  . Aspirin Other (See Comments)    asthma  . Lorazepam Shortness Of Breath  . Antivert [Meclizine]   . Codeine Nausea And Vomiting  . Other Dermatitis    Tape adhesive and surgical glue     Past Medical History:  Diagnosis Date  . Acute upper respiratory infections of unspecified site   . Anemia, unspecified   . Anxiety state, unspecified   . Cellulitis   . Chronic diastolic heart failure ([place])   . Congestive heart failure, unspecified   .  Contact dermatitis and other eczema, due to unspecified cause   . Depressive disorder, not elsewhere classified   . Diabetes mellitus   . Essential hypertension, benign   . Mastoiditis   . Other and unspecified hyperlipidemia   . Other diseases of lung, not elsewhere classified   . Other dyspnea and respiratory abnormality   . Recent retinal detachment, partial, with giant tear   . Type II or unspecified type diabetes mellitus with peripheral circulatory disorders, not stated as uncontrolled(250.70)   . Unspecified asthma, with exacerbation   . Unspecified urinary incontinence   . Wheezing     Past Surgical History:  Procedure Laterality Date  . ABDOMINAL HYSTERECTOMY    . DILATION AND CURETTAGE OF UTERUS    . EYE SURGERY     Left retina repair- 11/09, 02-20-09  .  KNEE SURGERY  2003   meniscus  . LAPAROSCOPIC GASTRIC BANDING  08/21/10    Family History  Problem Relation Age of Onset  . Cancer Mother        breast  . Cancer Sister        breast  . Cancer Maternal Aunt        breast  . Cancer Maternal Uncle        breast  . Cancer Maternal Grandmother        breast  . Cancer Maternal Uncle     Social History   Socioeconomic History  . Marital status: Married    Spouse name: Not on file  . Number of children: Not on file  . Years of education: Not on file  . Highest education level: Not on file  Occupational History  . Occupation: School bus Education administrator: RETIRED  Social Needs  . Financial resource strain: Not on file  . Food insecurity:    Worry: Not on file    Inability: Not on file  . Transportation needs:    Medical: Not on file    Non-medical: Not on file  Tobacco Use  . Smoking status: Never Smoker  . Smokeless tobacco: Never Used  Substance and Sexual Activity  . Alcohol use: No    Alcohol/week: 0.0 standard drinks  . Drug use: No  . Sexual activity: Never  Lifestyle  . Physical activity:    Days per week: Not on file    Minutes per session: Not on file  . Stress: Not on file  Relationships  . Social connections:    Talks on phone: Not on file    Gets together: Not on file    Attends religious service: Not on file    Active member of club or organization: Not on file    Attends meetings of clubs or organizations: Not on file    Relationship status: Not on file  . Intimate partner violence:    Fear of current or ex partner: Not on file    Emotionally abused: Not on file    Physically abused: Not on file    Forced sexual activity: Not on file  Other Topics Concern  . Not on file  Social History Narrative  . Not on file   The PMH, PSH, Social History, Family History, Medications, and allergies have been reviewed in [facility], and have been updated if relevant.   Review of Systems  Constitutional:  Positive for appetite change.  HENT: Negative.   Respiratory: Negative.   Gastrointestinal:       + feeling of bile in her throat/acid reflux  Neurological: Negative.   Psychiatric/Behavioral: Negative.   All other systems reviewed and are negative.      Objective:    BP 116/64   Pulse 68   Temp 98.4 F (36.9 C) (Oral)   Ht 5\' 5"  (1.651 m)   Wt 225 lb (102.1 kg)   SpO2 99%   BMI 37.44 kg/m   Wt Readings from Last 3 Encounters:  07/13/18 225 lb (102.1 kg)  06/22/18 238 lb 9.6 oz (108.2 kg)  06/18/18 237 lb 12.8 oz (107.9 kg)     Physical Exam  Constitutional: She is oriented to person, place, and time. She appears well-developed and well-nourished. No distress.  HENT:  Head: Normocephalic and atraumatic.  Eyes: EOM are normal.  Neck: Normal range of motion.  Cardiovascular: Normal rate.  Pulmonary/Chest: Effort normal.  Neurological: She is alert and oriented to person, place, and time. No cranial nerve deficit.  Skin: Skin is warm and dry. She is not diaphoretic.  Psychiatric: She has a normal mood and affect. Her behavior is normal. Judgment and thought content normal.  Nursing note and vitals reviewed.         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

## 2018-07-13 NOTE — Assessment & Plan Note (Signed)
>  25 minutes spent in face to face time with patient, >50% spent in counselling or coordination of care discussing diabetes, eating, reflux and her weight loss. I advised her to decrease lantus to 20 units daily since she is not taking in many calories.  She felt she needed to restart lantus and continue to increase it as her FSBS was increasing as high as 300s previously.  She agrees to decrease lantus to 20 units and to check FSBS twice daily.  She will keep me updated. Refill protonix-  Hopefully this will help increase her appetite. The patient indicates understanding of these issues and agrees with the plan.

## 2018-07-22 ENCOUNTER — Other Ambulatory Visit: Payer: Self-pay | Admitting: Family Medicine

## 2018-08-24 ENCOUNTER — Other Ambulatory Visit: Payer: Self-pay

## 2018-08-24 NOTE — Patient Outreach (Signed)
[facility]) Care Management  08/24/2018  [name] 1951/03/30 [id number]   Medication Adherence call to [name] left a message for patient to call back patient is due on Atorvastatin 40 mg and Lisinopril 20 mg. [name] is showing past due under [place].   [place] Management Direct Dial [phone]  Fax [phone] [email or web address]

## 2018-09-14 ENCOUNTER — Telehealth: Payer: Self-pay

## 2018-09-14 NOTE — Telephone Encounter (Signed)
I am not sure what we would order if she is not eating.  She doesn't need [place] to draw more blood work to verify that she is dehydrated.  If anything, she needs IV fluids.  IS she eating or drinking at all?  Is she on hospice?  A hospice RN can come to her home if she is.

## 2018-09-14 NOTE — Telephone Encounter (Signed)
TA-PEC called/pt stated that she has not been eating/has terminal cancer/she called her Oncologist who said that she needs to get labs done at her PCP office/What would you suggest that we order? Also if she has not been eating she may need to go to the [place] lab since she is likely dehydrated/Plz advise/thx dmf

## 2018-09-14 NOTE — Telephone Encounter (Signed)
TA-Pt states that Hospice has been wonderful but she is unsure if they can give her IV fluids/She says that she would need an order/she can hardly keep any food down either  I know this is separate but I remember my friend who had cancer they would give IV TPN and Fluids to her while she was still at home  Plz advise/thx dmf

## 2018-09-15 NOTE — Telephone Encounter (Signed)
I do not remember signing anything for hospice for her so I assume her oncologist is the attending for her?  I don't think I can give orders if I am not her attending.  Can you check with oncology?

## 2018-09-17 NOTE — Telephone Encounter (Signed)
[facility] patient feels she needs fluids badly via IV/usure about ordering so that home health nurse can administer? Plz advise/thx dmf

## 2018-10-08 IMAGING — CR DG CHEST 2V
1 series · 2 of 2 positions shown · non-contrast
Comparison: 06/11/2018 chest radiograph.

CLINICAL DATA: Dyspnea, history of ovarian cancer

EXAM:
CHEST - 2 VIEW

[Series 1: dg chest 2 view · 0.14mm/px · 2 of 2 slices shown]
[im 1/2]
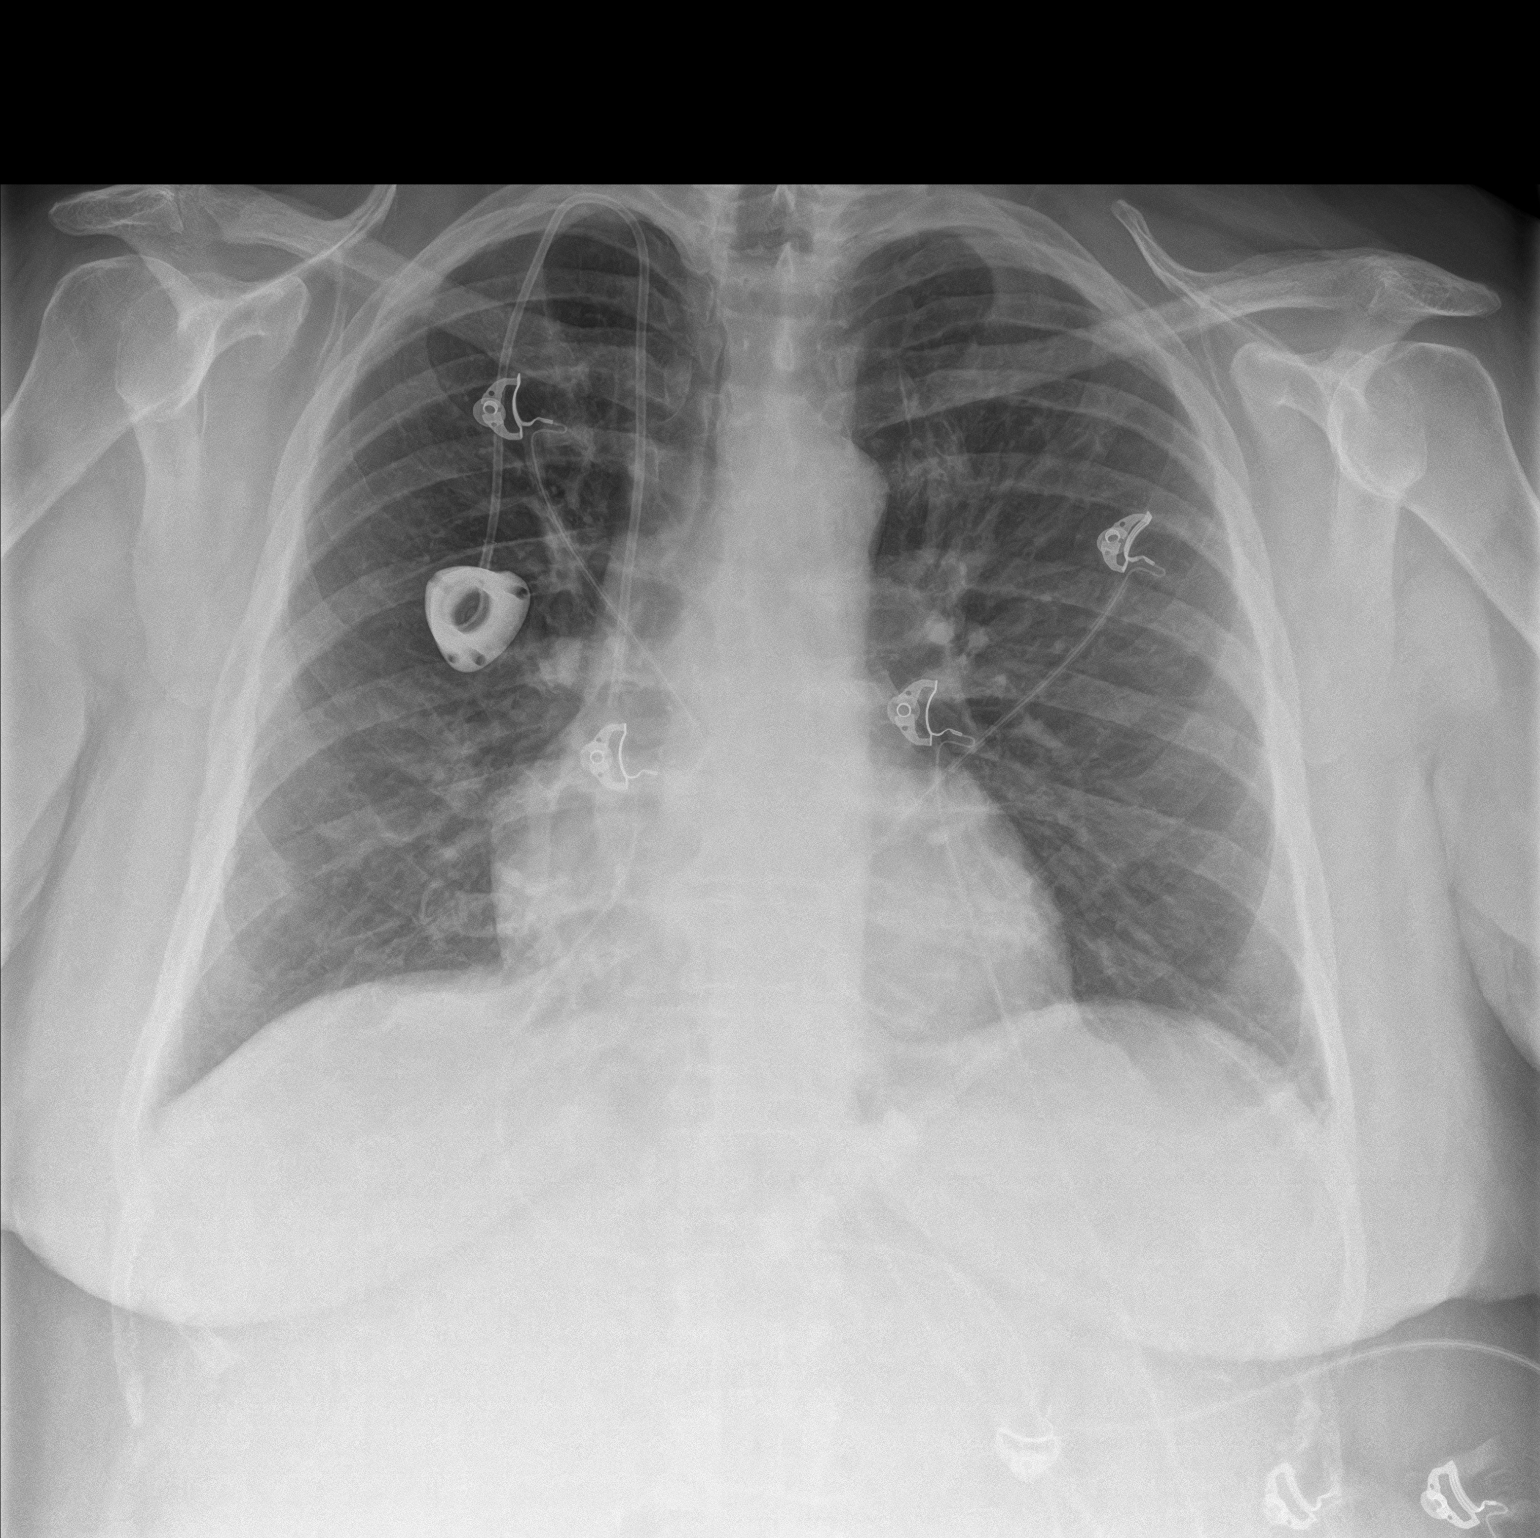
[im 2/2]
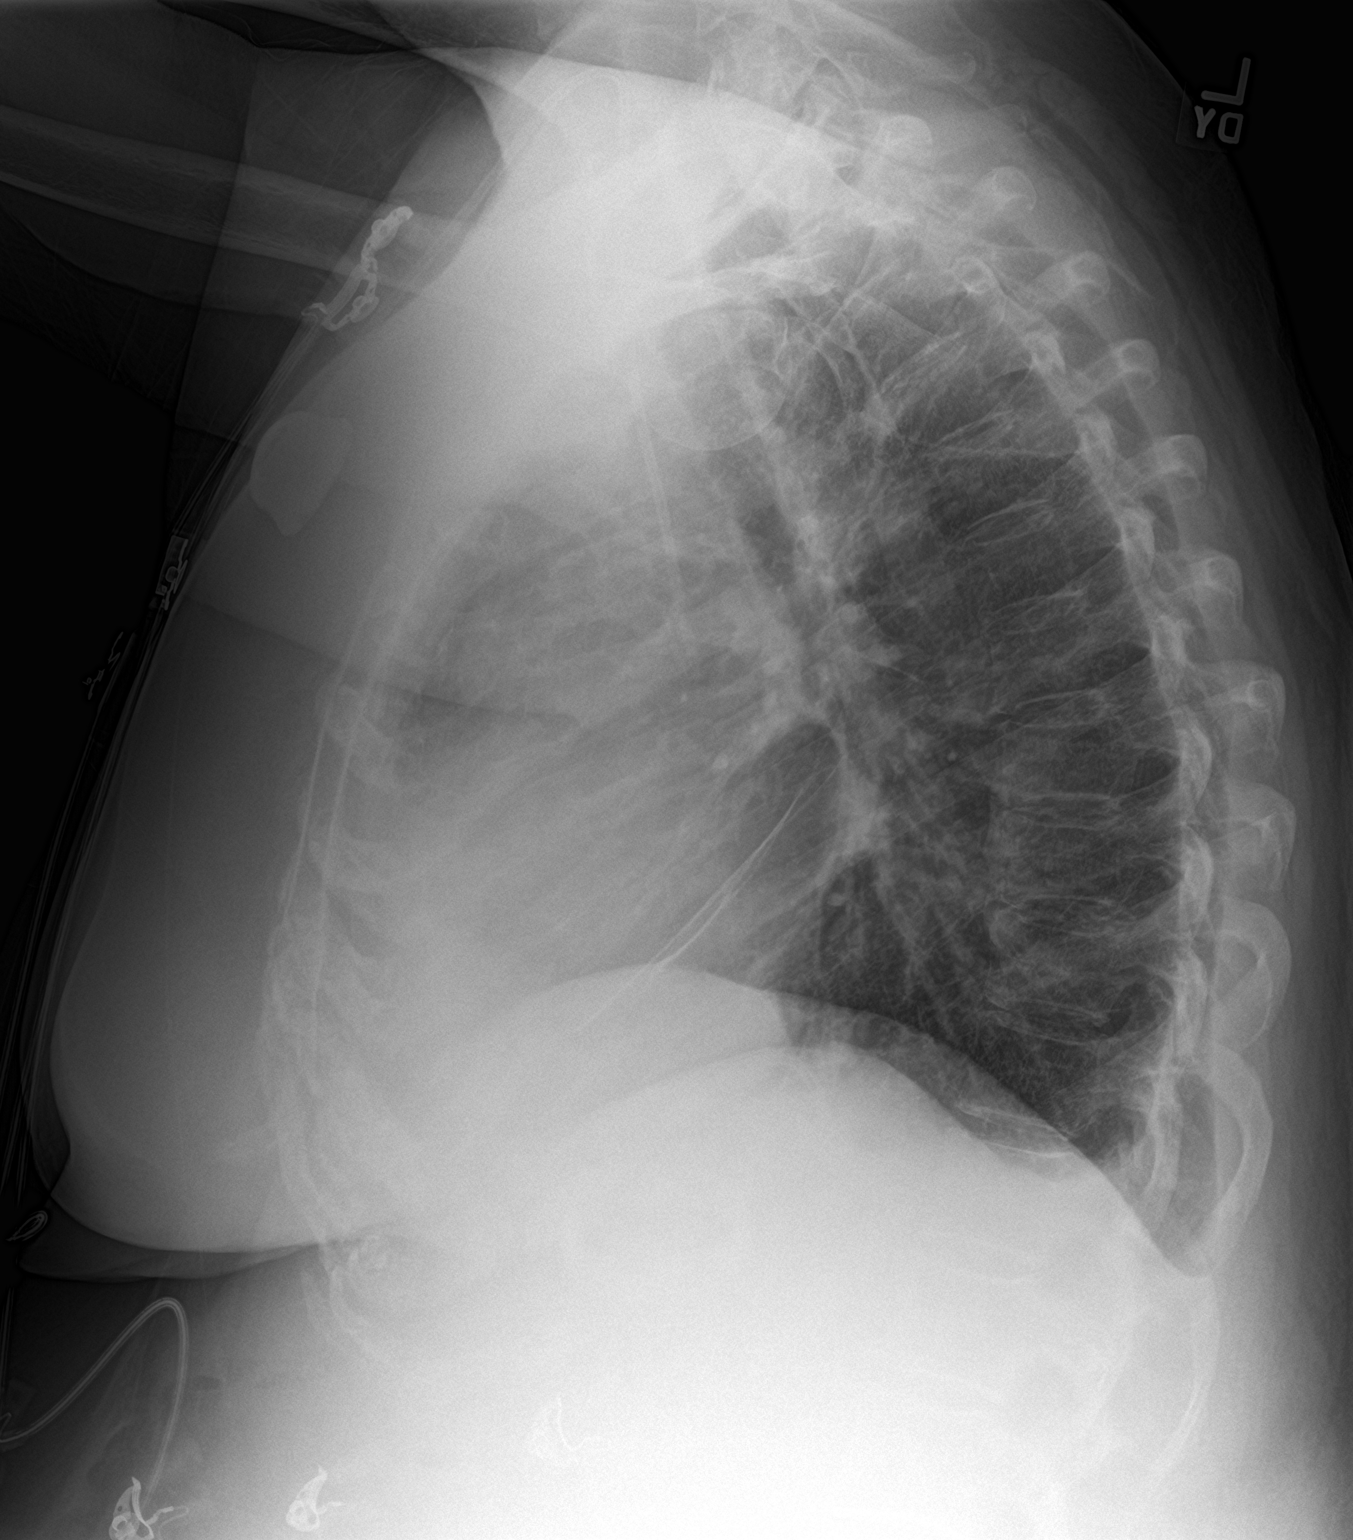

[2 of 2 positions shown; findings below may reference images not displayed]

FINDINGS: Right internal jugular MediPort terminates at the cavoatrial
junction. Stable cardiomediastinal silhouette with normal heart
size. No pneumothorax. Trace bilateral pleural effusions. No
pulmonary edema. No acute consolidative airspace disease.
IMPRESSION: Trace bilateral pleural effusions. Otherwise no active pulmonary
disease.

## 2018-10-17 DEATH — deceased
